# Patient Record
Sex: Male | Born: 1935 | Race: White | Hispanic: No | Marital: Married | Smoking: Former smoker
Health system: Southern US, Community
[De-identification: ages and names within clinical notes are randomized; demographics above are authoritative.]

## PROBLEM LIST (undated history)

## (undated) DIAGNOSIS — I2510 Atherosclerotic heart disease of native coronary artery without angina pectoris: Secondary | ICD-10-CM

## (undated) DIAGNOSIS — I959 Hypotension, unspecified: Secondary | ICD-10-CM

## (undated) DIAGNOSIS — D709 Neutropenia, unspecified: Secondary | ICD-10-CM

## (undated) DIAGNOSIS — I219 Acute myocardial infarction, unspecified: Secondary | ICD-10-CM

## (undated) DIAGNOSIS — E039 Hypothyroidism, unspecified: Secondary | ICD-10-CM

## (undated) DIAGNOSIS — Z7901 Long term (current) use of anticoagulants: Secondary | ICD-10-CM

## (undated) DIAGNOSIS — K469 Unspecified abdominal hernia without obstruction or gangrene: Secondary | ICD-10-CM

## (undated) DIAGNOSIS — E785 Hyperlipidemia, unspecified: Secondary | ICD-10-CM

## (undated) DIAGNOSIS — J189 Pneumonia, unspecified organism: Secondary | ICD-10-CM

## (undated) DIAGNOSIS — E538 Deficiency of other specified B group vitamins: Secondary | ICD-10-CM

## (undated) DIAGNOSIS — K219 Gastro-esophageal reflux disease without esophagitis: Secondary | ICD-10-CM

## (undated) DIAGNOSIS — I4891 Unspecified atrial fibrillation: Secondary | ICD-10-CM

## (undated) DIAGNOSIS — N400 Enlarged prostate without lower urinary tract symptoms: Secondary | ICD-10-CM

## (undated) DIAGNOSIS — D510 Vitamin B12 deficiency anemia due to intrinsic factor deficiency: Secondary | ICD-10-CM

## (undated) DIAGNOSIS — C44209 Unspecified malignant neoplasm of skin of left ear and external auricular canal: Secondary | ICD-10-CM

## (undated) DIAGNOSIS — D239 Other benign neoplasm of skin, unspecified: Secondary | ICD-10-CM

## (undated) DIAGNOSIS — M069 Rheumatoid arthritis, unspecified: Secondary | ICD-10-CM

## (undated) DIAGNOSIS — I209 Angina pectoris, unspecified: Secondary | ICD-10-CM

## (undated) HISTORY — DX: Deficiency of other specified B group vitamins: E53.8

## (undated) HISTORY — DX: Atherosclerotic heart disease of native coronary artery without angina pectoris: I25.10

## (undated) HISTORY — DX: Benign prostatic hyperplasia without lower urinary tract symptoms: N40.0

## (undated) HISTORY — DX: Unspecified atrial fibrillation: I48.91

## (undated) HISTORY — PX: CORONARY ANGIOPLASTY: SHX604

## (undated) HISTORY — DX: Long term (current) use of anticoagulants: Z79.01

## (undated) HISTORY — DX: Neutropenia, unspecified: D70.9

## (undated) HISTORY — PX: EXTERNAL EAR SURGERY: SHX627

## (undated) HISTORY — DX: Vitamin B12 deficiency anemia due to intrinsic factor deficiency: D51.0

## (undated) HISTORY — PX: CORNEAL TRANSPLANT: SHX108

## (undated) HISTORY — DX: Unspecified abdominal hernia without obstruction or gangrene: K46.9

## (undated) HISTORY — DX: Hyperlipidemia, unspecified: E78.5

## (undated) HISTORY — PX: CATARACT EXTRACTION W/ INTRAOCULAR LENS  IMPLANT, BILATERAL: SHX1307

## (undated) HISTORY — DX: Other benign neoplasm of skin, unspecified: D23.9

## (undated) HISTORY — DX: Rheumatoid arthritis, unspecified: M06.9

---

## 1979-04-23 HISTORY — PX: NASAL POLYP SURGERY: SHX186

## 2000-11-20 ENCOUNTER — Encounter: Payer: Self-pay | Admitting: Internal Medicine

## 2001-06-17 ENCOUNTER — Encounter: Payer: Self-pay | Admitting: Emergency Medicine

## 2001-06-17 ENCOUNTER — Emergency Department (HOSPITAL_COMMUNITY): Admission: EM | Admit: 2001-06-17 | Discharge: 2001-06-17 | Payer: Self-pay | Admitting: Emergency Medicine

## 2001-08-08 ENCOUNTER — Encounter: Payer: Self-pay | Admitting: Internal Medicine

## 2001-11-08 ENCOUNTER — Encounter: Payer: Self-pay | Admitting: Cardiology

## 2001-11-08 ENCOUNTER — Encounter (INDEPENDENT_AMBULATORY_CARE_PROVIDER_SITE_OTHER): Payer: Self-pay | Admitting: Specialist

## 2001-11-08 ENCOUNTER — Encounter: Payer: Self-pay | Admitting: Emergency Medicine

## 2001-11-08 ENCOUNTER — Inpatient Hospital Stay (HOSPITAL_COMMUNITY): Admission: EM | Admit: 2001-11-08 | Discharge: 2001-11-10 | Payer: Self-pay | Admitting: Emergency Medicine

## 2001-11-09 HISTORY — PX: LAPAROSCOPIC CHOLECYSTECTOMY: SUR755

## 2002-01-22 ENCOUNTER — Encounter: Payer: Self-pay | Admitting: Internal Medicine

## 2002-07-22 ENCOUNTER — Encounter: Payer: Self-pay | Admitting: Internal Medicine

## 2002-09-30 ENCOUNTER — Encounter: Payer: Self-pay | Admitting: Internal Medicine

## 2004-09-22 ENCOUNTER — Ambulatory Visit: Payer: Self-pay | Admitting: Internal Medicine

## 2005-03-28 ENCOUNTER — Ambulatory Visit: Payer: Self-pay | Admitting: Internal Medicine

## 2005-09-27 ENCOUNTER — Ambulatory Visit: Payer: Self-pay | Admitting: Internal Medicine

## 2006-04-11 ENCOUNTER — Ambulatory Visit: Payer: Self-pay | Admitting: Internal Medicine

## 2006-09-25 ENCOUNTER — Ambulatory Visit: Payer: Self-pay | Admitting: Internal Medicine

## 2006-09-25 LAB — CONVERTED CEMR LAB
AST: 37 units/L (ref 0–37)
Bilirubin, Direct: 0.1 mg/dL (ref 0.0–0.3)
Chloride: 109 meq/L (ref 96–112)
Eosinophils Absolute: 0.2 10*3/uL (ref 0.0–0.6)
Eosinophils Relative: 6.4 % — ABNORMAL HIGH (ref 0.0–5.0)
GFR calc non Af Amer: 68 mL/min
Glucose, Bld: 119 mg/dL — ABNORMAL HIGH (ref 70–99)
HCT: 42 % (ref 39.0–52.0)
Lymphocytes Relative: 43.8 % (ref 12.0–46.0)
MCV: 81.5 fL (ref 78.0–100.0)
Neutrophils Relative %: 27.1 % — ABNORMAL LOW (ref 43.0–77.0)
PSA: 1.63 ng/mL (ref 0.10–4.00)
RBC: 5.15 M/uL (ref 4.22–5.81)
Sodium: 144 meq/L (ref 135–145)
Total Bilirubin: 0.6 mg/dL (ref 0.3–1.2)
Total CHOL/HDL Ratio: 5
Total Protein: 8.6 g/dL — ABNORMAL HIGH (ref 6.0–8.3)
WBC: 3.1 10*3/uL — ABNORMAL LOW (ref 4.5–10.5)

## 2007-03-05 ENCOUNTER — Encounter: Payer: Self-pay | Admitting: Internal Medicine

## 2007-03-05 DIAGNOSIS — I2510 Atherosclerotic heart disease of native coronary artery without angina pectoris: Secondary | ICD-10-CM

## 2007-03-05 DIAGNOSIS — N400 Enlarged prostate without lower urinary tract symptoms: Secondary | ICD-10-CM

## 2007-03-05 DIAGNOSIS — I10 Essential (primary) hypertension: Secondary | ICD-10-CM

## 2007-03-05 DIAGNOSIS — M069 Rheumatoid arthritis, unspecified: Secondary | ICD-10-CM | POA: Insufficient documentation

## 2007-03-05 DIAGNOSIS — I252 Old myocardial infarction: Secondary | ICD-10-CM

## 2007-03-05 DIAGNOSIS — E785 Hyperlipidemia, unspecified: Secondary | ICD-10-CM

## 2007-03-05 HISTORY — DX: Rheumatoid arthritis, unspecified: M06.9

## 2007-03-05 HISTORY — DX: Hyperlipidemia, unspecified: E78.5

## 2007-03-05 HISTORY — DX: Atherosclerotic heart disease of native coronary artery without angina pectoris: I25.10

## 2007-03-05 HISTORY — DX: Benign prostatic hyperplasia without lower urinary tract symptoms: N40.0

## 2007-03-23 ENCOUNTER — Encounter: Payer: Self-pay | Admitting: Internal Medicine

## 2007-03-26 ENCOUNTER — Ambulatory Visit: Payer: Self-pay | Admitting: Internal Medicine

## 2007-05-11 ENCOUNTER — Ambulatory Visit: Payer: Self-pay | Admitting: Internal Medicine

## 2007-05-11 ENCOUNTER — Telehealth (INDEPENDENT_AMBULATORY_CARE_PROVIDER_SITE_OTHER): Payer: Self-pay

## 2007-05-11 DIAGNOSIS — D709 Neutropenia, unspecified: Secondary | ICD-10-CM

## 2007-05-11 HISTORY — DX: Neutropenia, unspecified: D70.9

## 2007-05-11 LAB — CONVERTED CEMR LAB
Albumin ELP: 45.8 % — ABNORMAL LOW (ref 55.8–66.1)
Alpha-1-Globulin: 4 % (ref 2.9–4.9)
Basophils Relative: 0.5 % (ref 0.0–1.0)
Folate: >20 ng/mL
Hemoglobin: 13 g/dL (ref 13.0–17.0)
Monocytes Relative: 20.4 % — ABNORMAL HIGH (ref 3.0–11.0)
Platelets: 94 10*3/uL — ABNORMAL LOW (ref 150–400)
RBC: 4.72 M/uL (ref 4.22–5.81)
RDW: 14.7 % — ABNORMAL HIGH (ref 11.5–14.6)
Total Protein, Serum Electrophoresis: 8.5 g/dL — ABNORMAL HIGH (ref 6.0–8.3)
Vitamin B-12: 130 pg/mL — ABNORMAL LOW (ref 211–911)

## 2007-05-14 ENCOUNTER — Ambulatory Visit: Payer: Self-pay | Admitting: Internal Medicine

## 2007-05-14 DIAGNOSIS — D510 Vitamin B12 deficiency anemia due to intrinsic factor deficiency: Secondary | ICD-10-CM

## 2007-05-14 HISTORY — DX: Vitamin B12 deficiency anemia due to intrinsic factor deficiency: D51.0

## 2007-05-20 ENCOUNTER — Ambulatory Visit: Payer: Self-pay | Admitting: Cardiology

## 2007-05-20 ENCOUNTER — Observation Stay (HOSPITAL_COMMUNITY): Admission: EM | Admit: 2007-05-20 | Discharge: 2007-05-24 | Payer: Self-pay | Admitting: Emergency Medicine

## 2007-05-21 ENCOUNTER — Encounter (INDEPENDENT_AMBULATORY_CARE_PROVIDER_SITE_OTHER): Payer: Self-pay | Admitting: Cardiology

## 2007-05-28 ENCOUNTER — Ambulatory Visit: Payer: Self-pay | Admitting: Internal Medicine

## 2007-06-04 ENCOUNTER — Ambulatory Visit: Payer: Self-pay | Admitting: Internal Medicine

## 2007-06-08 ENCOUNTER — Telehealth (INDEPENDENT_AMBULATORY_CARE_PROVIDER_SITE_OTHER): Payer: Self-pay

## 2007-07-17 ENCOUNTER — Encounter: Payer: Self-pay | Admitting: Internal Medicine

## 2007-08-08 ENCOUNTER — Ambulatory Visit: Payer: Self-pay | Admitting: Internal Medicine

## 2007-08-08 DIAGNOSIS — E538 Deficiency of other specified B group vitamins: Secondary | ICD-10-CM

## 2007-08-08 DIAGNOSIS — I4891 Unspecified atrial fibrillation: Secondary | ICD-10-CM

## 2007-08-08 HISTORY — DX: Unspecified atrial fibrillation: I48.91

## 2007-08-08 HISTORY — DX: Deficiency of other specified B group vitamins: E53.8

## 2007-09-27 ENCOUNTER — Ambulatory Visit: Payer: Self-pay | Admitting: Internal Medicine

## 2007-09-27 LAB — CONVERTED CEMR LAB
ALT: 24 units/L (ref 0–53)
Alkaline Phosphatase: 76 units/L (ref 39–117)
BUN: 14 mg/dL (ref 6–23)
Basophils Relative: 0.5 % (ref 0.0–1.0)
Calcium: 9.1 mg/dL (ref 8.4–10.5)
Chloride: 105 meq/L (ref 96–112)
Eosinophils Absolute: 0.1 10*3/uL (ref 0.0–0.6)
Eosinophils Relative: 6.4 % — ABNORMAL HIGH (ref 0.0–5.0)
GFR calc Af Amer: 104 mL/min
GFR calc non Af Amer: 86 mL/min
Glucose, Bld: 115 mg/dL — ABNORMAL HIGH (ref 70–99)
HDL: 19.1 mg/dL — ABNORMAL LOW (ref >39.0)
MCV: 80.6 fL (ref 78.0–100.0)
Monocytes Relative: 18 % — ABNORMAL HIGH (ref 3.0–11.0)
PSA: 1.03 ng/mL (ref 0.10–4.00)
Platelets: 92 10*3/uL — ABNORMAL LOW (ref 150–400)
RBC: 5.21 M/uL (ref 4.22–5.81)
TSH: 4.06 microintl units/mL (ref 0.35–5.50)
Total Bilirubin: 1.1 mg/dL (ref 0.3–1.2)
Total CHOL/HDL Ratio: 5.5
Total Protein: 8.1 g/dL (ref 6.0–8.3)
Triglycerides: 84 mg/dL (ref 0–149)
VLDL: 17 mg/dL (ref 0–40)
WBC: 2.2 10*3/uL — ABNORMAL LOW (ref 4.5–10.5)

## 2007-10-25 ENCOUNTER — Ambulatory Visit: Payer: Self-pay | Admitting: Internal Medicine

## 2007-11-23 ENCOUNTER — Ambulatory Visit: Payer: Self-pay | Admitting: Internal Medicine

## 2007-12-10 ENCOUNTER — Inpatient Hospital Stay (HOSPITAL_COMMUNITY): Admission: EM | Admit: 2007-12-10 | Discharge: 2007-12-14 | Payer: Self-pay | Admitting: Emergency Medicine

## 2007-12-10 ENCOUNTER — Ambulatory Visit: Payer: Self-pay | Admitting: Internal Medicine

## 2007-12-10 DIAGNOSIS — N390 Urinary tract infection, site not specified: Secondary | ICD-10-CM

## 2007-12-10 LAB — CONVERTED CEMR LAB
Nitrite: NEGATIVE
Specific Gravity, Urine: 1.025

## 2007-12-14 ENCOUNTER — Encounter: Payer: Self-pay | Admitting: Internal Medicine

## 2007-12-21 ENCOUNTER — Ambulatory Visit: Payer: Self-pay | Admitting: Internal Medicine

## 2008-01-24 ENCOUNTER — Ambulatory Visit: Payer: Self-pay | Admitting: Internal Medicine

## 2008-01-24 DIAGNOSIS — D239 Other benign neoplasm of skin, unspecified: Secondary | ICD-10-CM | POA: Insufficient documentation

## 2008-01-24 HISTORY — DX: Other benign neoplasm of skin, unspecified: D23.9

## 2008-02-21 ENCOUNTER — Ambulatory Visit: Payer: Self-pay | Admitting: Internal Medicine

## 2008-03-24 ENCOUNTER — Ambulatory Visit: Payer: Self-pay | Admitting: Internal Medicine

## 2008-04-21 ENCOUNTER — Ambulatory Visit: Payer: Self-pay | Admitting: Internal Medicine

## 2008-04-24 ENCOUNTER — Encounter: Payer: Self-pay | Admitting: Internal Medicine

## 2008-04-29 ENCOUNTER — Ambulatory Visit: Payer: Self-pay | Admitting: Internal Medicine

## 2008-05-26 ENCOUNTER — Ambulatory Visit: Payer: Self-pay | Admitting: Internal Medicine

## 2008-06-23 ENCOUNTER — Ambulatory Visit: Payer: Self-pay | Admitting: Internal Medicine

## 2008-07-29 ENCOUNTER — Ambulatory Visit: Payer: Self-pay | Admitting: Internal Medicine

## 2008-08-25 ENCOUNTER — Ambulatory Visit: Payer: Self-pay | Admitting: Internal Medicine

## 2008-08-25 DIAGNOSIS — J069 Acute upper respiratory infection, unspecified: Secondary | ICD-10-CM | POA: Insufficient documentation

## 2008-09-24 ENCOUNTER — Ambulatory Visit: Payer: Self-pay | Admitting: Internal Medicine

## 2008-10-20 ENCOUNTER — Ambulatory Visit: Payer: Self-pay | Admitting: Internal Medicine

## 2008-11-20 ENCOUNTER — Ambulatory Visit: Payer: Self-pay | Admitting: Internal Medicine

## 2008-12-03 ENCOUNTER — Ambulatory Visit: Payer: Self-pay | Admitting: Internal Medicine

## 2008-12-22 ENCOUNTER — Ambulatory Visit: Payer: Self-pay | Admitting: Internal Medicine

## 2009-01-22 ENCOUNTER — Ambulatory Visit: Payer: Self-pay | Admitting: Internal Medicine

## 2009-02-12 ENCOUNTER — Encounter: Payer: Self-pay | Admitting: Internal Medicine

## 2009-02-19 ENCOUNTER — Ambulatory Visit: Payer: Self-pay | Admitting: Internal Medicine

## 2009-03-23 ENCOUNTER — Ambulatory Visit: Payer: Self-pay | Admitting: Internal Medicine

## 2009-04-09 ENCOUNTER — Ambulatory Visit: Payer: Self-pay | Admitting: Internal Medicine

## 2009-04-09 LAB — CONVERTED CEMR LAB
ALT: 17 units/L (ref 0–53)
AST: 29 units/L (ref 0–37)
BUN: 16 mg/dL (ref 6–23)
Bilirubin, Direct: 0.1 mg/dL (ref 0.0–0.3)
Creatinine, Ser: 1.1 mg/dL (ref 0.4–1.5)
Eosinophils Relative: 7.8 % — ABNORMAL HIGH (ref 0.0–5.0)
GFR calc non Af Amer: 67.61 mL/min (ref >60)
Monocytes Absolute: 0.4 10*3/uL (ref 0.1–1.0)
Monocytes Relative: 18.1 % — ABNORMAL HIGH (ref 3.0–12.0)
Neutrophils Relative %: 42.6 % — ABNORMAL LOW (ref 43.0–77.0)
Platelets: 83 10*3/uL — ABNORMAL LOW (ref 150.0–400.0)
Total Bilirubin: 0.8 mg/dL (ref 0.3–1.2)
WBC: 2.3 10*3/uL — ABNORMAL LOW (ref 4.5–10.5)

## 2009-04-16 ENCOUNTER — Telehealth: Payer: Self-pay | Admitting: Internal Medicine

## 2009-04-22 ENCOUNTER — Ambulatory Visit: Payer: Self-pay | Admitting: Internal Medicine

## 2009-04-22 ENCOUNTER — Telehealth: Payer: Self-pay | Admitting: Internal Medicine

## 2009-04-23 ENCOUNTER — Ambulatory Visit: Payer: Self-pay | Admitting: Oncology

## 2009-04-29 ENCOUNTER — Encounter: Payer: Self-pay | Admitting: Internal Medicine

## 2009-04-29 LAB — CBC WITH DIFFERENTIAL/PLATELET
BASO%: 0.6 % (ref 0.0–2.0)
Eosinophils Absolute: 0.2 10*3/uL (ref 0.0–0.5)
LYMPH%: 36 % (ref 14.0–49.0)
MCHC: 33.3 g/dL (ref 32.0–36.0)
MCV: 78.8 fL — ABNORMAL LOW (ref 79.3–98.0)
MONO#: 0.4 10*3/uL (ref 0.1–0.9)
MONO%: 14.7 % — ABNORMAL HIGH (ref 0.0–14.0)
NEUT#: 1 10*3/uL — ABNORMAL LOW (ref 1.5–6.5)
Platelets: 96 10*3/uL — ABNORMAL LOW (ref 140–400)
RBC: 4.94 10*6/uL (ref 4.20–5.82)
RDW: 17.7 % — ABNORMAL HIGH (ref 11.0–14.6)
WBC: 2.4 10*3/uL — ABNORMAL LOW (ref 4.0–10.3)

## 2009-04-29 LAB — COMPREHENSIVE METABOLIC PANEL
ALT: 14 U/L (ref 0–53)
AST: 21 U/L (ref 0–37)
CO2: 24 mEq/L (ref 19–32)
Creatinine, Ser: 1.03 mg/dL (ref 0.40–1.50)
Sodium: 139 mEq/L (ref 135–145)
Total Bilirubin: 0.4 mg/dL (ref 0.3–1.2)
Total Protein: 7.3 g/dL (ref 6.0–8.3)

## 2009-04-29 LAB — CHCC SMEAR

## 2009-05-05 ENCOUNTER — Ambulatory Visit (HOSPITAL_COMMUNITY): Admission: RE | Admit: 2009-05-05 | Discharge: 2009-05-05 | Payer: Self-pay | Admitting: Oncology

## 2009-05-25 ENCOUNTER — Ambulatory Visit: Payer: Self-pay | Admitting: Internal Medicine

## 2009-06-22 ENCOUNTER — Ambulatory Visit: Payer: Self-pay | Admitting: Internal Medicine

## 2009-06-26 ENCOUNTER — Ambulatory Visit: Payer: Self-pay | Admitting: Oncology

## 2009-06-30 ENCOUNTER — Encounter (INDEPENDENT_AMBULATORY_CARE_PROVIDER_SITE_OTHER): Payer: Self-pay | Admitting: *Deleted

## 2009-06-30 LAB — COMPREHENSIVE METABOLIC PANEL
Albumin: 3.6 g/dL (ref 3.5–5.2)
BUN: 16 mg/dL (ref 6–23)
CO2: 24 mEq/L (ref 19–32)
Calcium: 8.6 mg/dL (ref 8.4–10.5)
Chloride: 102 mEq/L (ref 96–112)
Glucose, Bld: 103 mg/dL — ABNORMAL HIGH (ref 70–99)
Potassium: 4.5 mEq/L (ref 3.5–5.3)
Sodium: 136 mEq/L (ref 135–145)
Total Protein: 7.2 g/dL (ref 6.0–8.3)

## 2009-06-30 LAB — CBC WITH DIFFERENTIAL/PLATELET
Basophils Absolute: 0 10*3/uL (ref 0.0–0.1)
Eosinophils Absolute: 0.2 10*3/uL (ref 0.0–0.5)
HCT: 38.1 % — ABNORMAL LOW (ref 38.4–49.9)
HGB: 12.7 g/dL — ABNORMAL LOW (ref 13.0–17.1)
MCH: 26.7 pg — ABNORMAL LOW (ref 27.2–33.4)
MONO#: 0.5 10*3/uL (ref 0.1–0.9)
NEUT#: 1 10*3/uL — ABNORMAL LOW (ref 1.5–6.5)
NEUT%: 37.6 % — ABNORMAL LOW (ref 39.0–75.0)
RDW: 17.3 % — ABNORMAL HIGH (ref 11.0–14.6)
WBC: 2.5 10*3/uL — ABNORMAL LOW (ref 4.0–10.3)
lymph#: 0.9 10*3/uL (ref 0.9–3.3)

## 2009-07-06 ENCOUNTER — Encounter: Payer: Self-pay | Admitting: Oncology

## 2009-07-06 ENCOUNTER — Ambulatory Visit (HOSPITAL_COMMUNITY): Admission: RE | Admit: 2009-07-06 | Discharge: 2009-07-06 | Payer: Self-pay | Admitting: Oncology

## 2009-07-06 ENCOUNTER — Ambulatory Visit: Payer: Self-pay | Admitting: Oncology

## 2009-07-10 ENCOUNTER — Encounter (INDEPENDENT_AMBULATORY_CARE_PROVIDER_SITE_OTHER): Payer: Self-pay | Admitting: *Deleted

## 2009-07-22 ENCOUNTER — Ambulatory Visit: Payer: Self-pay | Admitting: Internal Medicine

## 2009-08-05 ENCOUNTER — Encounter (INDEPENDENT_AMBULATORY_CARE_PROVIDER_SITE_OTHER): Payer: Self-pay | Admitting: *Deleted

## 2009-08-24 ENCOUNTER — Ambulatory Visit: Payer: Self-pay | Admitting: Internal Medicine

## 2009-09-10 ENCOUNTER — Ambulatory Visit (HOSPITAL_COMMUNITY): Admission: RE | Admit: 2009-09-10 | Discharge: 2009-09-11 | Payer: Self-pay | Admitting: Otolaryngology

## 2009-09-10 ENCOUNTER — Encounter (INDEPENDENT_AMBULATORY_CARE_PROVIDER_SITE_OTHER): Payer: Self-pay | Admitting: Otolaryngology

## 2009-09-23 ENCOUNTER — Ambulatory Visit: Payer: Self-pay | Admitting: Internal Medicine

## 2009-10-12 ENCOUNTER — Ambulatory Visit: Payer: Self-pay | Admitting: Oncology

## 2009-10-14 ENCOUNTER — Encounter: Payer: Self-pay | Admitting: Internal Medicine

## 2009-10-14 LAB — COMPREHENSIVE METABOLIC PANEL
ALT: 20 U/L (ref 0–53)
AST: 27 U/L (ref 0–37)
Albumin: 3.7 g/dL (ref 3.5–5.2)
Alkaline Phosphatase: 84 U/L (ref 39–117)
BUN: 14 mg/dL (ref 6–23)
Calcium: 8.9 mg/dL (ref 8.4–10.5)
Chloride: 104 mEq/L (ref 96–112)
Potassium: 3.9 mEq/L (ref 3.5–5.3)
Sodium: 138 mEq/L (ref 135–145)
Total Protein: 7.7 g/dL (ref 6.0–8.3)

## 2009-10-14 LAB — CBC WITH DIFFERENTIAL/PLATELET
Basophils Absolute: 0 10*3/uL (ref 0.0–0.1)
EOS%: 4.2 % (ref 0.0–7.0)
HGB: 15 g/dL (ref 13.0–17.1)
MCH: 30.6 pg (ref 27.2–33.4)
MONO%: 17 % — ABNORMAL HIGH (ref 0.0–14.0)
NEUT#: 1.1 10*3/uL — ABNORMAL LOW (ref 1.5–6.5)
RBC: 4.9 10*6/uL (ref 4.20–5.82)
RDW: 17 % — ABNORMAL HIGH (ref 11.0–14.6)
lymph#: 1.2 10*3/uL (ref 0.9–3.3)

## 2009-10-15 ENCOUNTER — Ambulatory Visit: Payer: Self-pay | Admitting: Internal Medicine

## 2009-11-20 ENCOUNTER — Ambulatory Visit: Payer: Self-pay | Admitting: Internal Medicine

## 2009-12-21 ENCOUNTER — Ambulatory Visit: Payer: Self-pay | Admitting: Internal Medicine

## 2010-01-21 ENCOUNTER — Ambulatory Visit: Payer: Self-pay | Admitting: Internal Medicine

## 2010-02-08 ENCOUNTER — Ambulatory Visit: Payer: Self-pay | Admitting: Oncology

## 2010-02-10 ENCOUNTER — Encounter: Payer: Self-pay | Admitting: Internal Medicine

## 2010-02-10 LAB — CBC WITH DIFFERENTIAL/PLATELET
Basophils Absolute: 0 10*3/uL (ref 0.0–0.1)
Eosinophils Absolute: 0.1 10*3/uL (ref 0.0–0.5)
HGB: 16.3 g/dL (ref 13.0–17.1)
MONO#: 0.4 10*3/uL (ref 0.1–0.9)
NEUT#: 0.9 10*3/uL — ABNORMAL LOW (ref 1.5–6.5)
RBC: 4.97 10*6/uL (ref 4.20–5.82)
RDW: 14.8 % — ABNORMAL HIGH (ref 11.0–14.6)
WBC: 2.2 10*3/uL — ABNORMAL LOW (ref 4.0–10.3)
lymph#: 0.8 10*3/uL — ABNORMAL LOW (ref 0.9–3.3)

## 2010-02-10 LAB — COMPREHENSIVE METABOLIC PANEL
Albumin: 3.7 g/dL (ref 3.5–5.2)
BUN: 12 mg/dL (ref 6–23)
Calcium: 8.9 mg/dL (ref 8.4–10.5)
Chloride: 107 mEq/L (ref 96–112)
Glucose, Bld: 92 mg/dL (ref 70–99)
Potassium: 4.1 mEq/L (ref 3.5–5.3)
Sodium: 137 mEq/L (ref 135–145)
Total Protein: 7.4 g/dL (ref 6.0–8.3)

## 2010-02-18 ENCOUNTER — Ambulatory Visit: Payer: Self-pay | Admitting: Internal Medicine

## 2010-02-26 ENCOUNTER — Encounter: Payer: Self-pay | Admitting: Internal Medicine

## 2010-03-22 ENCOUNTER — Ambulatory Visit: Payer: Self-pay | Admitting: Internal Medicine

## 2010-04-13 ENCOUNTER — Ambulatory Visit: Payer: Self-pay | Admitting: Internal Medicine

## 2010-04-13 LAB — CONVERTED CEMR LAB
Albumin: 3.7 g/dL (ref 3.5–5.2)
BUN: 17 mg/dL (ref 6–23)
Basophils Relative: 0.4 % (ref 0.0–3.0)
Calcium: 9 mg/dL (ref 8.4–10.5)
Cholesterol: 125 mg/dL (ref 0–200)
Creatinine, Ser: 1 mg/dL (ref 0.4–1.5)
Eosinophils Relative: 6.1 % — ABNORMAL HIGH (ref 0.0–5.0)
GFR calc non Af Amer: 75.28 mL/min (ref >60)
Glucose, Bld: 117 mg/dL — ABNORMAL HIGH (ref 70–99)
HCT: 52.3 % — ABNORMAL HIGH (ref 39.0–52.0)
Lymphs Abs: 1 10*3/uL (ref 0.7–4.0)
MCV: 96.2 fL (ref 78.0–100.0)
Monocytes Absolute: 0.4 10*3/uL (ref 0.1–1.0)
Neutro Abs: 1.2 10*3/uL — ABNORMAL LOW (ref 1.4–7.7)
Platelets: 89 10*3/uL — ABNORMAL LOW (ref 150.0–400.0)
Sodium: 143 meq/L (ref 135–145)
TSH: 5.37 microintl units/mL (ref 0.35–5.50)
WBC: 2.9 10*3/uL — ABNORMAL LOW (ref 4.5–10.5)

## 2010-04-22 ENCOUNTER — Ambulatory Visit: Payer: Self-pay | Admitting: Internal Medicine

## 2010-05-21 ENCOUNTER — Ambulatory Visit: Payer: Self-pay | Admitting: Internal Medicine

## 2010-06-14 ENCOUNTER — Ambulatory Visit: Payer: Self-pay | Admitting: Oncology

## 2010-06-16 ENCOUNTER — Encounter: Payer: Self-pay | Admitting: Internal Medicine

## 2010-06-16 LAB — CBC WITH DIFFERENTIAL/PLATELET
Basophils Absolute: 0 10*3/uL (ref 0.0–0.1)
Eosinophils Absolute: 0.1 10*3/uL (ref 0.0–0.5)
HGB: 16.3 g/dL (ref 13.0–17.1)
NEUT#: 1.8 10*3/uL (ref 1.5–6.5)
RBC: 4.94 10*6/uL (ref 4.20–5.82)
RDW: 14.7 % — ABNORMAL HIGH (ref 11.0–14.6)
WBC: 3.5 10*3/uL — ABNORMAL LOW (ref 4.0–10.3)
lymph#: 1.2 10*3/uL (ref 0.9–3.3)

## 2010-06-16 LAB — COMPREHENSIVE METABOLIC PANEL
AST: 28 U/L (ref 0–37)
Albumin: 3.8 g/dL (ref 3.5–5.2)
BUN: 17 mg/dL (ref 6–23)
Calcium: 9.1 mg/dL (ref 8.4–10.5)
Chloride: 104 mEq/L (ref 96–112)
Glucose, Bld: 111 mg/dL — ABNORMAL HIGH (ref 70–99)
Potassium: 4.7 mEq/L (ref 3.5–5.3)
Sodium: 135 mEq/L (ref 135–145)
Total Protein: 7.4 g/dL (ref 6.0–8.3)

## 2010-06-22 ENCOUNTER — Ambulatory Visit: Payer: Self-pay | Admitting: Internal Medicine

## 2010-07-22 ENCOUNTER — Ambulatory Visit: Payer: Self-pay | Admitting: Internal Medicine

## 2010-08-22 HISTORY — PX: INGUINAL HERNIA REPAIR: SUR1180

## 2010-08-25 ENCOUNTER — Ambulatory Visit
Admission: RE | Admit: 2010-08-25 | Discharge: 2010-08-25 | Payer: Self-pay | Source: Home / Self Care | Attending: Internal Medicine | Admitting: Internal Medicine

## 2010-09-20 ENCOUNTER — Encounter
Admission: RE | Admit: 2010-09-20 | Discharge: 2010-09-20 | Payer: Self-pay | Source: Home / Self Care | Attending: Cardiology | Admitting: Cardiology

## 2010-09-20 ENCOUNTER — Encounter: Payer: Self-pay | Admitting: Internal Medicine

## 2010-09-21 ENCOUNTER — Ambulatory Visit
Admission: RE | Admit: 2010-09-21 | Discharge: 2010-09-21 | Payer: Self-pay | Source: Home / Self Care | Attending: Vascular Surgery | Admitting: Vascular Surgery

## 2010-09-21 NOTE — Letter (Signed)
[facility]   Imported By: Maryln Gottron 03/02/2010 15:27:48  _____________________________________________________________________  External Attachment:    Type:   Image     Comment:   External Document

## 2010-09-21 NOTE — Letter (Signed)
Summary: [facility]  [facility]   Imported By: Maryln Gottron 07/05/2010 12:49:46  _____________________________________________________________________  External Attachment:    Type:   Image     Comment:   External Document

## 2010-09-21 NOTE — Assessment & Plan Note (Signed)
Summary: B12 INJ // RS  Nurse Visit   Vitals Entered By: Duard Brady LPN (February 18, 2010 10:40 AM)  Allergies: 1)  ! Vantin (Cefpodoxime Proxetil)  Medication Administration  Injection # 1:    Medication: Vit B12 1000 mcg    Diagnosis: VITAMIN B12 DEFICIENCY (ICD-266.2)    Route: IM    Site: L deltoid    Exp Date: 09/2011    Lot #: [id number]    Mfr: [employer]    Patient tolerated injection without complications    Given by: Duard Brady LPN (February 18, 2010 10:41 AM)  Orders Added: 1)  Vit B12 1000 mcg [J3420] 2)  Admin of Therapeutic Inj  intramuscular or subcutaneous [[id number]]

## 2010-09-21 NOTE — Letter (Signed)
Summary: Cardiology-Dr. Viann Fish  Cardiology-Dr. Viann Fish   Imported By: Maryln Gottron 03/04/2010 13:24:12  _____________________________________________________________________  External Attachment:    Type:   Image     Comment:   External Document

## 2010-09-21 NOTE — Assessment & Plan Note (Signed)
Summary: b12 inj/njr  Nurse Visit   Vital Signs:  Patient profile:   77 year old male Temp:     97.5 degrees F oral  Vitals Entered By: Duard Brady LPN (April 22, 2010 10:06 AM)  Allergies: 1)  ! Vantin (Cefpodoxime Proxetil)  Medication Administration  Injection # 1:    Medication: Vit B12 1000 mcg    Diagnosis: VITAMIN B12 DEFICIENCY (ICD-266.2)    Route: IM    Site: R deltoid    Exp Date: 11/2011    Lot #: [id number]    Mfr: [employer]    Patient tolerated injection without complications    Given by: Duard Brady LPN (April 22, 2010 10:07 AM)  Orders Added: 1)  Vit B12 1000 mcg [J3420] 2)  Admin of Therapeutic Inj  intramuscular or subcutaneous [[id number]]

## 2010-09-21 NOTE — Assessment & Plan Note (Signed)
Summary: b-12 inj/cjr  Nurse Visit   Allergies: 1)  ! Vantin (Cefpodoxime Proxetil)  Medication Administration  Injection # 1:    Medication: Vit B12 1000 mcg    Diagnosis: VITAMIN B12 DEFICIENCY (ICD-266.2)    Route: IM    Site: L deltoid    Exp Date: 03/2011    Lot #: [id number]    Mfr: [employer]    Patient tolerated injection without complications    Given by: Raechel Ache, RN (September 23, 2009 10:15 AM)  Orders Added: 1)  Vit B12 1000 mcg [J3420] 2)  Admin of Therapeutic Inj  intramuscular or subcutaneous [[id number]]

## 2010-09-21 NOTE — Assessment & Plan Note (Signed)
Summary: b-12 inj/cjr  Nurse Visit   Allergies: 1)  ! Vantin (Cefpodoxime Proxetil)

## 2010-09-21 NOTE — Assessment & Plan Note (Signed)
Summary: b12 inj//ccm  Nurse Visit   Vitals Entered By: Duard Brady LPN (March 22, 2010 10:00 AM)  Allergies: 1)  ! Vantin (Cefpodoxime Proxetil)  Medication Administration  Injection # 1:    Medication: Vit B12 1000 mcg    Diagnosis: VITAMIN B12 DEFICIENCY (ICD-266.2)    Route: IM    Site: R deltoid    Exp Date: 09/2011    Lot #: [id number]    Mfr: [employer]    Patient tolerated injection without complications    Given by: Duard Brady LPN (March 22, 2010 10:01 AM)  Orders Added: 1)  Vit B12 1000 mcg [J3420] 2)  Admin of Therapeutic Inj  intramuscular or subcutaneous [[id number]]

## 2010-09-21 NOTE — Assessment & Plan Note (Signed)
Summary: B12 INJ // RS  Nurse Visit   Allergies: 1)  ! Vantin (Cefpodoxime Proxetil)  Medication Administration  Injection # 1:    Medication: Vit B12 1000 mcg    Diagnosis: VITAMIN B12 DEFICIENCY (ICD-266.2)    Route: IM    Site: L deltoid    Exp Date: 04/2012    Lot #: [id number]    Mfr: [employer]    Patient tolerated injection without complications    Given by: Duard Brady LPN (January 22, 9603 10:23 AM)  Orders Added: 1)  Vit B12 1000 mcg [J3420] 2)  Admin of Therapeutic Inj  intramuscular or subcutaneous [[id number]]

## 2010-09-21 NOTE — Assessment & Plan Note (Signed)
Summary: b12//ccm  Nurse Visit   Vital Signs:  Patient profile:   77 year old male Temp:     98.0 degrees F oral  Vitals Entered By: Duard Brady LPN (May 21, 2010 10:17 AM)  Allergies: 1)  ! Vantin  Medication Administration  Injection # 1:    Medication: Vit B12 1000 mcg    Diagnosis: VITAMIN B12 DEFICIENCY (ICD-266.2)    Route: IM    Site: L deltoid    Exp Date: 11/2011    Lot #: [id number]    Mfr: [employer]    Patient tolerated injection without complications    Given by: Duard Brady LPN (May 21, 2010 10:18 AM)  Orders Added: 1)  Vit B12 1000 mcg [J3420] 2)  Admin of Therapeutic Inj  intramuscular or subcutaneous [[id number]]

## 2010-09-21 NOTE — Assessment & Plan Note (Signed)
Summary: 6 MONTH ROV/NJR   Vital Signs:  Patient profile:   77 year old male Weight:      174 pounds Temp:     97.4 degrees F oral BP sitting:   100 / 62  (left arm) Cuff size:   regular  Vitals Entered By: Duard Brady LPN (October 15, 2009 10:37 AM) CC: 6 mos rov - doing well , had (r) ear surg .  , also requesting b12 injection if not to early  Is Patient Diabetic? No   CC:  6 mos rov - doing well , had (r) ear surg .  , and also requesting b12 injection if not to early .  History of Present Illness: 77 year old patient seen today for follow-up.  He has had recent surgery involving the right ear for a basal cell cancer.  He has B12 deficiency, chronic atrial fibrillation  and a history of RA, which has been quite absent.  He has chronic artery disease.  He is followed by cardiology and has done quite well.  Denies any exertional chest pain or shortness of breath.  He remains on chronic anticoagulation therapy.  Preventive Screening-Counseling & Management  Alcohol-Tobacco     Smoking Status: quit  Allergies: 1)  ! Vantin (Cefpodoxime Proxetil)  Past History:  Past Medical History: Reviewed history from 12/21/2007 and no changes required. Coronary artery disease MI 1992 Hyperlipidemia Hypertension Benign prostatic hypertrophy Rheumatoid arthritis Myocardial infarction, hx of 1982 Anticoagulation therapy Atrial fibrillation vitamin B12 deficiency history of urosepsis, April 2009  Family History: Reviewed history from 04/09/2009 and no changes required. father died at  55 of tuberculosis  mother deceased at 79 of an acute myocardial infarction one half sister lived into her 73s  Review of Systems       The patient complains of suspicious skin lesions.  The patient denies anorexia, fever, weight loss, weight gain, vision loss, decreased hearing, hoarseness, chest pain, syncope, dyspnea on exertion, peripheral edema, prolonged cough, headaches, hemoptysis,  abdominal pain, melena, hematochezia, severe indigestion/heartburn, incontinence, genital sores, muscle weakness, transient blindness, difficulty walking, depression, unusual weight change, abnormal bleeding, enlarged lymph nodes, angioedema, breast masses, and testicular masses.    Physical Exam  General:  Well-developed,well-nourished,in no acute distress; alert,appropriate and cooperative throughout examination; low pressure 104/64 Head:  Normocephalic and atraumatic without obvious abnormalities. No apparent alopecia or balding. Eyes:  No corneal or conjunctival inflammation noted. EOMI. Perrla. Funduscopic exam benign, without hemorrhages, exudates or papilledema. Vision grossly normal. Ears:  healing  right ear resection and skin grafting Mouth:  Oral mucosa and oropharynx without lesions or exudates.  Teeth in good repair. Neck:  No deformities, masses, or tenderness noted. Lungs:  Normal respiratory effort, chest expands symmetrically. Lungs are clear to auscultation, no crackles or wheezes. Heart:  Normal rate and irregular rhythm. S1 and S2 normal without gallop, murmur, click, rub or other extra sounds. Abdomen:  Bowel sounds positive,abdomen soft and non-tender without masses, organomegaly or hernias noted. Msk:  No deformity or scoliosis noted of thoracic or lumbar spine.   Pulses:  R and L carotid,radial,femoral,dorsalis pedis and posterior tibial pulses are full and equal bilaterally Extremities:  No clubbing, cyanosis, edema, or deformity noted with normal full range of motion of all joints.     Impression & Recommendations:  Problem # 1:  ATRIAL FIBRILLATION (ICD-427.31)  His updated medication list for this problem includes:    Metoprolol Succinate 25 Mg Tb24 (Metoprolol succinate) ..... One every am    Coumadin  5 Mg Tabs (Warfarin sodium) .Marland Kitchen... As dir  His updated medication list for this problem includes:    Metoprolol Succinate 25 Mg Tb24 (Metoprolol succinate) .....  One every am    Coumadin 5 Mg Tabs (Warfarin sodium) .Marland Kitchen... As dir  Problem # 2:  ANTICOAGULATION THERAPY (ICD-V58.61)  Problem # 3:  VITAMIN B12 DEFICIENCY (ICD-266.2)  Problem # 4:  HYPERTENSION (ICD-401.9)  His updated medication list for this problem includes:    Metoprolol Succinate 25 Mg Tb24 (Metoprolol succinate) ..... One every am  His updated medication list for this problem includes:    Metoprolol Succinate 25 Mg Tb24 (Metoprolol succinate) ..... One every am  Problem # 5:  CORONARY ARTERY DISEASE (ICD-414.00)  His updated medication list for this problem includes:    Metoprolol Succinate 25 Mg Tb24 (Metoprolol succinate) ..... One every am  His updated medication list for this problem includes:    Metoprolol Succinate 25 Mg Tb24 (Metoprolol succinate) ..... One every am  Complete Medication List: 1)  Niaspan 500 Mg Tbcr (Niacin (antihyperlipidemic)) .Marland Kitchen.. 1000 mg at bedtime 2)  Metoprolol Succinate 25 Mg Tb24 (Metoprolol succinate) .... One every am 3)  Coumadin 5 Mg Tabs (Warfarin sodium) .... As dir 4)  Cobal-1000 1000 Mcg/ml Inj Soln (Cyanocobalamin) .... Q month 5)  Crestor 20 Mg Tabs (Rosuvastatin calcium) .... 1/2 once daily 6)  Hydrocodone-homatropine 5-1.5 Mg/82ml Syrp (Hydrocodone-homatropine) .Marland Kitchen.. 1 teaspoon every 6 hours as needed for cough 7)  Pravastatin Sodium 40 Mg Tabs (Pravastatin sodium) .... Qd 8)  Mucinex 600 Mg Xr12h-tab (Guaifenesin) .... Qd 9)  Vitamin C Cr 500 Mg Cr-tabs (Ascorbic acid) .... Qd 10)  Centrum Silver Tabs (Multiple vitamins-minerals) .... Qd 11)  Fish Oil 1000 Mg Caps (Omega-3 fatty acids) .... 2 by mouth qd 12)  Ferrous Sulfate 325 (65 Fe) Mg Tabs (Ferrous sulfate) .... Qd  Patient Instructions: 1)  Please schedule a follow-up appointment in 6 months for CPX 2)  Advised not to eat any food or drink any liquids after 10 PM the night before your procedure. 3)  Limit your Sodium (Salt).  Appended Document: 6 MONTH  ROV/NJR     Allergies: 1)  ! Vantin (Cefpodoxime Proxetil)   Complete Medication List: 1)  Niaspan 500 Mg Tbcr (Niacin (antihyperlipidemic)) .Marland Kitchen.. 1000 mg at bedtime 2)  Metoprolol Succinate 25 Mg Tb24 (Metoprolol succinate) .... One every am 3)  Coumadin 5 Mg Tabs (Warfarin sodium) .... As dir 4)  Cobal-1000 1000 Mcg/ml Inj Soln (Cyanocobalamin) .... Q month 5)  Crestor 20 Mg Tabs (Rosuvastatin calcium) .... 1/2 once daily 6)  Hydrocodone-homatropine 5-1.5 Mg/57ml Syrp (Hydrocodone-homatropine) .Marland Kitchen.. 1 teaspoon every 6 hours as needed for cough 7)  Pravastatin Sodium 40 Mg Tabs (Pravastatin sodium) .... Qd 8)  Mucinex 600 Mg Xr12h-tab (Guaifenesin) .... Qd 9)  Vitamin C Cr 500 Mg Cr-tabs (Ascorbic acid) .... Qd 10)  Centrum Silver Tabs (Multiple vitamins-minerals) .... Qd 11)  Fish Oil 1000 Mg Caps (Omega-3 fatty acids) .... 2 by mouth qd 12)  Ferrous Sulfate 325 (65 Fe) Mg Tabs (Ferrous sulfate) .... Qd  Other Orders: Vit B12 1000 mcg (J3420) Admin of Therapeutic Inj  intramuscular or subcutaneous ([id number])    Medication Administration  Injection # 1:    Medication: Vit B12 1000 mcg    Diagnosis: VITAMIN B12 DEFICIENCY (ICD-266.2)    Route: IM    Site: L deltoid    Exp Date: 04-2011    Lot #: [id number]    Mfr: [employer]  Patient tolerated injection without complications  Orders Added: 1)  Vit B12 1000 mcg [J3420] 2)  Admin of Therapeutic Inj  intramuscular or subcutaneous [[id number]]

## 2010-09-21 NOTE — Letter (Signed)
[facility]   Imported By: Maryln Gottron 10/30/2009 11:05:41  _____________________________________________________________________  External Attachment:    Type:   Image     Comment:   External Document

## 2010-09-21 NOTE — Assessment & Plan Note (Signed)
Summary: B12 INJ/NJR  Nurse Visit   Allergies: 1)  ! Vantin  Appended Document: Orders Update    Clinical Lists Changes  Orders: Added new Service order of Vit B12 1000 mcg ([id number]) - Signed Added new Service order of Admin of Therapeutic Inj  intramuscular or subcutaneous ([id number]) - Signed       Medication Administration  Injection # 1:    Medication: Vit B12 1000 mcg    Diagnosis: VITAMIN B12 DEFICIENCY (ICD-266.2)    Route: IM    Site: L deltoid    Exp Date: 02/20/2012    Lot #: [id number]    Mfr: [employer]    Patient tolerated injection without complications    Given by: Kern Reap CMA (AAMA) (June 22, 2010 11:14 AM)  Orders Added: 1)  Vit B12 1000 mcg [J3420] 2)  Admin of Therapeutic Inj  intramuscular or subcutaneous [[id number]]

## 2010-09-21 NOTE — Assessment & Plan Note (Signed)
Summary: B-12 INJ/CJR  Nurse Visit   Vitals Entered By: Duard Brady LPN (Dec 22, 4399 10:15 AM)  Allergies: 1)  ! Vantin (Cefpodoxime Proxetil)  Medication Administration  Injection # 1:    Medication: Vit B12 1000 mcg    Diagnosis: ANEMIA, PERNICIOUS (ICD-281.0)    Route: IM    Site: L deltoid    Exp Date: 04/2011    Lot #: [id number]    Mfr: [employer]    Patient tolerated injection without complications    Given by: Duard Brady LPN (Dec 21, 5364 10:16 AM)  Orders Added: 1)  Vit B12 1000 mcg [J3420] 2)  Admin of Therapeutic Inj  intramuscular or subcutaneous [[id number]]

## 2010-09-21 NOTE — Assessment & Plan Note (Signed)
Summary: b12 inj//ccm  Nurse Visit   Vitals Entered By: Duard Brady LPN (July 22, 2010 10:04 AM)  Allergies: 1)  ! Vantin  Medication Administration  Injection # 1:    Medication: Vit B12 1000 mcg    Diagnosis: VITAMIN B12 DEFICIENCY (ICD-266.2)    Route: IM    Site: R deltoid    Exp Date: 02/2012    Lot #: [id number]    Mfr: [employer]    Patient tolerated injection without complications    Given by: Duard Brady LPN (July 22, 2010 10:05 AM)  Orders Added: 1)  Vit B12 1000 mcg [J3420] 2)  Admin of Therapeutic Inj  intramuscular or subcutaneous [[id number]]

## 2010-09-21 NOTE — Assessment & Plan Note (Signed)
Summary: B-12 INJ // RS  Nurse Visit   Allergies: 1)  ! Vantin (Cefpodoxime Proxetil)  Medication Administration  Injection # 1:    Medication: Vit B12 1000 mcg    Diagnosis: VITAMIN B12 DEFICIENCY (ICD-266.2)    Route: IM    Site: L deltoid    Exp Date: 03/2011    Lot #: [id number]    Mfr: [employer]    Patient tolerated injection without complications    Given by: Raechel Ache, RN (August 24, 2009 4:01 PM)  Orders Added: 1)  Vit B12 1000 mcg [J3420] 2)  Admin of Therapeutic Inj  intramuscular or subcutaneous [[id number]]

## 2010-09-21 NOTE — Assessment & Plan Note (Signed)
Summary: pt will come in fasting/njr   Vital Signs:  Patient profile:   77 year old male Height:      69 inches Weight:      169 pounds BMI:     25.05 Temp:     97.6 degrees F oral BP sitting:   102 / 60  (left arm) Cuff size:   regular  Vitals Entered By: Kathrynn Speed CMA (April 13, 2010 9:34 AM) CC: cpx, pt  fasting, src Is Patient Diabetic? No   CC:  cpx, pt  fasting, and src.  History of Present Illness: 77 year old patient seen today for a comprehensive evaluation.  he is followed closely by cardiology and has a history of coronary artery disease and permanent atrial fibrillation.  He remains on chronic anticoagulation therapy.  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  He also has a history of B12 deficiency.  He has dyslipidemia and hypertension.  Here for Medicare AWV:  1.   Risk factors based on Past M, S, F history:  patient has a history of hypertension, dyslipidemia, and coronary artery disease 2.   Physical Activities: fairly active, but limited by age and arthritis 3.   Depression/mood: no history of depression or mood disorder 4.   Hearing: mild hearing impairment; is scheduled for hearing aids in the near future 5.   ADL's: independent in all aspects of daily living 6.   Fall Risk: moderate  due to age and arthritis 7.   Home Safety: no problems identified 8.   Height, weight, &visual acuity:height and weight stable;  has had recent correction to his glasses 9.   Counseling: heart healthy diet discussed and encouraged.  Will continue monthly INRs 10.   Labs ordered based on risk factors: laboratory studies, including an SGOT will be reviewed.  Will continue monthly prothrombin times 11.           Referral Coordination- will continue follow-up with cardiology and hematology 12.           Care Plan- hearing aid evaluation as planned 13.            Cognitive Assessment- alert and oriented, with normal affect   Preventive  Screening-Counseling & Management  Alcohol-Tobacco     Smoking Status: never  Caffeine-Diet-Exercise     Does Patient Exercise: no  Current Medications (verified): 1)  Niaspan 500 Mg  Tbcr (Niacin (Antihyperlipidemic)) .Marland Kitchen.. 1000 Mg At Bedtime 2)  Metoprolol Succinate 25 Mg  Tb24 (Metoprolol Succinate) .... One Every Am 3)  Coumadin 5 Mg  Tabs (Warfarin Sodium) .... As Dir 4)  Pravastatin Sodium 40 Mg Tabs (Pravastatin Sodium) .... Qd 5)  Mucinex 600 Mg Xr12h-Tab (Guaifenesin) .... Qd 6)  Vitamin C Cr 500 Mg Cr-Tabs (Ascorbic Acid) .... Qd 7)  Centrum Silver  Tabs (Multiple Vitamins-Minerals) .... Qd 8)  Fish Oil 1000 Mg Caps (Omega-3 Fatty Acids) .... 2 By Mouth Qd  Allergies (verified): 1)  ! Vantin (Cefpodoxime Proxetil)  Past History:  Past Medical History: Coronary artery disease MI 1992 Hyperlipidemia Hypertension Benign prostatic hypertrophy Rheumatoid arthritis Myocardial infarction, hx of 1982 Anticoagulation therapy Atrial fibrillation vitamin B12 deficiency history of urosepsis, April 2009 neutropenia   Past Surgical History: Reviewed history from 09/27/2007 and no changes required. Cholecystectomy  laparoscopic 2003 Percutaneous transluminal coronary angioplasty  1997 nasal polyps  Family History: Reviewed history from 04/09/2009 and no changes required. father died at  55 of tuberculosis  mother deceased at 75 of  an acute myocardial infarction one half sister lived into her 30s  Social History: Married Never Smoked Regular exercise-no Smoking Status:  never Does Patient Exercise:  no  Review of Systems  The patient denies anorexia, fever, weight loss, weight gain, vision loss, decreased hearing, hoarseness, chest pain, syncope, dyspnea on exertion, peripheral edema, prolonged cough, headaches, hemoptysis, abdominal pain, melena, hematochezia, severe indigestion/heartburn, hematuria, incontinence, genital sores, muscle weakness, suspicious skin  lesions, transient blindness, difficulty walking, depression, unusual weight change, abnormal bleeding, enlarged lymph nodes, angioedema, breast masses, and testicular masses.    Physical Exam  General:  elderly alert, no distress.  Blood pressure 110/70 Head:  Normocephalic and atraumatic without obvious abnormalities. No apparent alopecia or balding. Eyes:  No corneal or conjunctival inflammation noted. EOMI. Perrla. Funduscopic exam benign, without hemorrhages, exudates or papilledema. Vision grossly normal. Ears:  External ear exam shows no significant lesions or deformities.  Otoscopic examination reveals clear canals, tympanic membranes are intact bilaterally without bulging, retraction, inflammation or discharge. Hearing is grossly normal bilaterally. Nose:  External nasal examination shows no deformity or inflammation. Nasal mucosa are pink and moist without lesions or exudates. Mouth:  Oral mucosa and oropharynx without lesions or exudates.  Teeth in good repair. Neck:  No deformities, masses, or tenderness noted. Chest Wall:  No deformities, masses, tenderness or gynecomastia noted. Lungs:  bibasilar crackles.  The right greater than the left Heart:  irregular rhythm with controlled ventricular response Abdomen:  Bowel sounds positive,abdomen soft and non-tender without masses, organomegaly or hernias noted. Rectal:  No external abnormalities noted. Normal sphincter tone. No rectal masses or tenderness. Genitalia:  Testes bilaterally descended without nodularity, tenderness or masses. No scrotal masses or lesions. No penis lesions or urethral discharge. Prostate:  2+ enlarged.  2+ enlarged.   Msk:  ulnar  deviation of the hands, right greater than left, consistent with his history of RA Pulses:  R and L carotid,radial,femoral,dorsalis pedis and posterior tibial pulses are full and equal bilaterally Extremities:  No clubbing, cyanosis, edema, or deformity noted with normal full range of  motion of all joints.   Neurologic:  alert & oriented X3, strength normal in all extremities, sensation intact to light touch, sensation intact to pinprick, DTRs symmetrical and normal, and heel-to-shin normal.  alert & oriented X3, strength normal in all extremities, sensation intact to light touch, sensation intact to pinprick, DTRs symmetrical and normal, and heel-to-shin normal.   Skin:  Intact without suspicious lesions or rashes Cervical Nodes:  No lymphadenopathy noted Axillary Nodes:  No palpable lymphadenopathy Inguinal Nodes:  No significant adenopathy Psych:  Cognition and judgment appear intact. Alert and cooperative with normal attention span and concentration. No apparent delusions, illusions, hallucinations   Impression & Recommendations:  Problem # 1:  PREVENTIVE HEALTH CARE (ICD-V70.0)  Orders: Medicare -1st Annual Wellness Visit [phone])  Complete Medication List: 1)  Niaspan 500 Mg Tbcr (Niacin (antihyperlipidemic)) .Marland Kitchen.. 1000 mg at bedtime 2)  Metoprolol Succinate 25 Mg Tb24 (Metoprolol succinate) .... One every am 3)  Coumadin 5 Mg Tabs (Warfarin sodium) .... As dir 4)  Pravastatin Sodium 40 Mg Tabs (Pravastatin sodium) .... Qd 5)  Mucinex 600 Mg Xr12h-tab (Guaifenesin) .... Qd 6)  Vitamin C Cr 500 Mg Cr-tabs (Ascorbic acid) .... Qd 7)  Centrum Silver Tabs (Multiple vitamins-minerals) .... Qd 8)  Fish Oil 1000 Mg Caps (Omega-3 fatty acids) .... 2 by mouth qd  Other Orders: Venipuncture (09811) TLB-CBC Platelet - w/Differential (85025-CBCD) TLB-BMP (Basic Metabolic Panel-BMET) (80048-METABOL) TLB-Hepatic/Liver Function  Pnl (80076-HEPATIC) TLB-TSH (Thyroid Stimulating Hormone) (84443-TSH) TLB-Cholesterol, Total (82465-CHO)  Patient Instructions: 1)  Please schedule a follow-up appointment in 6 months. 2)  Limit your Sodium (Salt). 3)  It is important that you exercise regularly at least 20 minutes 5 times a week. If you develop chest pain, have severe difficulty  breathing, or feel very tired , stop exercising immediately and seek medical attention. 4)  Take calcium +Vitamin D daily. Prescriptions: PRAVASTATIN SODIUM 40 MG TABS (PRAVASTATIN SODIUM) qd  #90 x 6   Entered and Authorized by:   Gordy Savers  MD   Signed by:   Gordy Savers  MD on 04/13/2010   Method used:   Electronically to        [facility] [phone]* (retail)       [address]       [place], [place]  [address]       Ph: [phone]       Fax: [phone]   RxID:   [id number] COUMADIN 5 MG  TABS (WARFARIN SODIUM) as dir  #90 x 6   Entered and Authorized by:   Gordy Savers  MD   Signed by:   Gordy Savers  MD on 04/13/2010   Method used:   Electronically to        [facility] [phone]* (retail)       [address]       [place], [place]  [address]       Ph: [phone]       Fax: [phone]   RxID:   [id number] METOPROLOL SUCCINATE 25 MG  TB24 (METOPROLOL SUCCINATE) one every am  #180 x 0   Entered and Authorized by:   Gordy Savers  MD   Signed by:   Gordy Savers  MD on 04/13/2010   Method used:   Electronically to        [facility] [phone]* (retail)       [address]       [place], [place]  [address]       Ph: [phone]       Fax: [phone]   RxID:   [id number] NIASPAN 500 MG  TBCR (NIACIN (ANTIHYPERLIPIDEMIC)) 1000 mg at bedtime  #180 x 0   Entered and Authorized by:   Gordy Savers  MD   Signed by:   Gordy Savers  MD on 04/13/2010   Method used:   Electronically to        [facility] [phone]* (retail)       [address]       [place], [place]  [address]       Ph: [phone]       Fax: [phone]   RxID:   [id number]

## 2010-09-22 ENCOUNTER — Encounter: Payer: Self-pay | Admitting: Cardiology

## 2010-09-23 NOTE — Assessment & Plan Note (Signed)
Summary: B-12 INJ/CJR  Nurse Visit   Allergies: 1)  ! Vantin  Medication Administration  Injection # 1:    Medication: Vit B12 1000 mcg    Diagnosis: ANEMIA, PERNICIOUS (ICD-281.0)    Route: IM    Site: L deltoid    Exp Date: 05/2012    Lot #: [id number]    Mfr: [employer]ent    Patient tolerated injection without complications    Given by: Duard Brady LPN (August 25, 2010 10:00 AM)  Orders Added: 1)  Vit B12 1000 mcg [J3420] 2)  Admin of Therapeutic Inj  intramuscular or subcutaneous [24401]

## 2010-09-27 NOTE — Procedures (Unsigned)
DUPLEX DEEP VENOUS EXAM - LOWER EXTREMITY  INDICATION:  Left lower extremity edema.  HISTORY:  Edema:  Yes, left leg. Trauma/Surgery:  No. Pain:  Yes. PE:  No. Previous DVT:  No. Anticoagulants:  Yes, long-term use of warfarin for atrial fibrillation. Other: Mass of left hamstring.  DUPLEX EXAM:               CFV   SFV   PopV  PTV    GSV               R  L  R  L  R  L  R   L  R  L Thrombosis    o  o  o     o  o  o      o  o Spontaneous   +  +  +     +  +  + Phasic        +  +  +     +  +  + Augmentation  +  +  +     +  +  + Compressible  +  +  +     +  +  + Competent     +  +  +     o  o  +  Legend:  + - yes  o - no  p - partial  D - decreased  IMPRESSION: 1. No evidence of deep venous thrombosis in the right or left lower     extremity. 2. Limited visualization of the left lower extremity due to excessive     edema caused by the mass in the medial thigh.   _____________________________ Janetta Hora. Fields, MD  EM/MEDQ  D:  09/21/2010  T:  09/21/2010  Job:  [id number]

## 2010-09-29 NOTE — Letter (Signed)
Summary: Cardiology- Dr. Viann Fish  Cardiology- Dr. Viann Fish   Imported By: Maryln Gottron 09/24/2010 12:51:47  _____________________________________________________________________  External Attachment:    Type:   Image     Comment:   External Document

## 2010-10-07 ENCOUNTER — Ambulatory Visit (INDEPENDENT_AMBULATORY_CARE_PROVIDER_SITE_OTHER): Payer: 59 | Admitting: Internal Medicine

## 2010-10-07 DIAGNOSIS — D510 Vitamin B12 deficiency anemia due to intrinsic factor deficiency: Secondary | ICD-10-CM

## 2010-10-07 MED ORDER — CYANOCOBALAMIN 1000 MCG/ML IJ SOLN
1000.0000 ug | Freq: Once | INTRAMUSCULAR | Status: AC
Start: 2010-10-07 — End: 2010-10-07
  Administered 2010-10-07: 1000 ug via INTRAMUSCULAR

## 2010-10-11 ENCOUNTER — Encounter: Payer: Self-pay | Admitting: Internal Medicine

## 2010-10-12 ENCOUNTER — Encounter: Payer: Self-pay | Admitting: Internal Medicine

## 2010-10-12 ENCOUNTER — Ambulatory Visit (INDEPENDENT_AMBULATORY_CARE_PROVIDER_SITE_OTHER): Payer: 59 | Admitting: Internal Medicine

## 2010-10-12 DIAGNOSIS — E785 Hyperlipidemia, unspecified: Secondary | ICD-10-CM

## 2010-10-12 DIAGNOSIS — I4891 Unspecified atrial fibrillation: Secondary | ICD-10-CM

## 2010-10-12 DIAGNOSIS — I2510 Atherosclerotic heart disease of native coronary artery without angina pectoris: Secondary | ICD-10-CM

## 2010-10-12 DIAGNOSIS — I10 Essential (primary) hypertension: Secondary | ICD-10-CM

## 2010-10-12 MED ORDER — METOPROLOL TARTRATE 25 MG PO TABS: ORAL_TABLET | ORAL | Status: DC

## 2010-10-12 MED ORDER — WARFARIN SODIUM 5 MG PO TABS: 3.0000 mg | ORAL_TABLET | Freq: Every day | ORAL | Status: DC

## 2010-10-12 NOTE — Progress Notes (Signed)
  Subjective:    Patient ID: Kevin Rubio, male    DOB: 1921-09-19, 76 y.o.   MRN: [id number]  HPI 83 Your old patient who is seen today in follow-up.  He is also followed  closely by cardiology and also followed at the [facility] hospital system.  Recently,  He has what sounds like a cellulitis involving the left upper inner thigh that has resolved.  He has done quite well and denies any cardiopulmonary complaints.  He does monitor his blood pressure and pulse dilated remains on chronic Coumadin anticoagulation now to 3-mg daily dose remains on beta blocker therapy, as well as statin therapy.  He denies any chest pain.  He has a history of RA, which has been stable and also a history of B12 deficiency    Review of Systems  Constitutional: Negative for fever, chills, appetite change and fatigue.  HENT: Negative for hearing loss, ear pain, congestion, sore throat, trouble swallowing, neck stiffness, dental problem, voice change and tinnitus.   Eyes: Negative for pain, discharge and visual disturbance.  Respiratory: Negative for cough, chest tightness, wheezing and stridor.   Cardiovascular: Negative for chest pain, palpitations and leg swelling.  Gastrointestinal: Negative for nausea, vomiting, abdominal pain, diarrhea, constipation, blood in stool and abdominal distention.  Genitourinary: Negative for urgency, hematuria, flank pain, discharge, difficulty urinating and genital sores.  Musculoskeletal: Negative for myalgias, back pain, joint swelling, arthralgias and gait problem.  Skin: Negative for rash.  Neurological: Negative for dizziness, syncope, speech difficulty, weakness, numbness and headaches.  Hematological: Negative for adenopathy. Does not bruise/bleed easily.  Psychiatric/Behavioral: Negative for behavioral problems and dysphoric mood. The patient is not nervous/anxious.        Objective:   Physical Exam  Constitutional: He is oriented to person, place, and time. He appears  well-developed.  HENT:  Head: Normocephalic.  Right Ear: External ear normal.  Left Ear: External ear normal.  Eyes: Conjunctivae and EOM are normal.  Neck: Normal range of motion.  Cardiovascular: Normal rate and normal heart sounds.        Irregular rhythm with controlled ventricular response  Pulmonary/Chest: He has rales.       A few bibasilar rales  Abdominal: Bowel sounds are normal.  Musculoskeletal: Normal range of motion. He exhibits no edema and no tenderness.  Neurological: He is alert and oriented to person, place, and time.  Psychiatric: He has a normal mood and affect. His behavior is normal.          Assessment & Plan:  Coronary artery disease-stable Dyslipidemia Chronic atrial fibrillation Chronic Coumadin anticoagulation.  INR is followed by cardiology History of pernicious anemia  Will continue his present regimen and schedule a complete physical in 6 months

## 2010-10-12 NOTE — Patient Instructions (Signed)
It is important that you exercise regularly, at least 20 minutes 3 to 4 times per week.  If you develop chest pain or shortness of breath seek  medical attention. Limit your sodium (Salt) intake  Return in 6 months for follow-up  Monthly INR

## 2010-11-05 ENCOUNTER — Ambulatory Visit (INDEPENDENT_AMBULATORY_CARE_PROVIDER_SITE_OTHER): Payer: 59 | Admitting: Internal Medicine

## 2010-11-05 DIAGNOSIS — E538 Deficiency of other specified B group vitamins: Secondary | ICD-10-CM

## 2010-11-05 MED ORDER — CYANOCOBALAMIN 1000 MCG/ML IJ SOLN
1000.0000 ug | Freq: Once | INTRAMUSCULAR | Status: AC
  Administered 2010-11-05: 1000 ug via INTRAMUSCULAR

## 2010-11-07 LAB — URINE MICROSCOPIC-ADD ON

## 2010-11-07 LAB — URINALYSIS, ROUTINE W REFLEX MICROSCOPIC
Bilirubin Urine: NEGATIVE
Glucose, UA: NEGATIVE mg/dL
Ketones, ur: NEGATIVE mg/dL
Leukocytes, UA: NEGATIVE
Nitrite: NEGATIVE
Protein, ur: NEGATIVE mg/dL
Specific Gravity, Urine: 1.02 (ref 1.005–1.030)
Urobilinogen, UA: 0.2 mg/dL (ref 0.0–1.0)
pH: 6 (ref 5.0–8.0)

## 2010-11-07 LAB — COMPREHENSIVE METABOLIC PANEL
BUN: 13 mg/dL (ref 6–23)
CO2: 28 mEq/L (ref 19–32)
Calcium: 8.8 mg/dL (ref 8.4–10.5)
Creatinine, Ser: 0.91 mg/dL (ref 0.4–1.5)
GFR calc non Af Amer: >60 mL/min (ref >60)
Glucose, Bld: 112 mg/dL — ABNORMAL HIGH (ref 70–99)
Total Bilirubin: 0.8 mg/dL (ref 0.3–1.2)

## 2010-11-07 LAB — COMPREHENSIVE METABOLIC PANEL WITH GFR
ALT: 27 U/L (ref 0–53)
AST: 38 U/L — ABNORMAL HIGH (ref 0–37)
Albumin: 3.6 g/dL (ref 3.5–5.2)
Alkaline Phosphatase: 90 U/L (ref 39–117)
Chloride: 104 meq/L (ref 96–112)
GFR calc Af Amer: >60 mL/min (ref >60)
Potassium: 4.1 meq/L (ref 3.5–5.1)
Sodium: 136 meq/L (ref 135–145)
Total Protein: 7.5 g/dL (ref 6.0–8.3)

## 2010-11-07 LAB — APTT: aPTT: 37 seconds (ref 24–37)

## 2010-11-07 LAB — TISSUE CULTURE

## 2010-11-07 LAB — CBC
HCT: 45.5 % (ref 39.0–52.0)
Hemoglobin: 15.2 g/dL (ref 13.0–17.0)
MCHC: 33.4 g/dL (ref 30.0–36.0)
MCV: 86.2 fL (ref 78.0–100.0)
Platelets: 77 K/uL — ABNORMAL LOW (ref 150–400)
RBC: 5.28 MIL/uL (ref 4.22–5.81)
RDW: 19.7 % — ABNORMAL HIGH (ref 11.5–15.5)
WBC: 2.6 K/uL — ABNORMAL LOW (ref 4.0–10.5)

## 2010-11-07 LAB — ANAEROBIC CULTURE

## 2010-11-07 LAB — PROTIME-INR
INR: 1.2 (ref 0.00–1.49)
Prothrombin Time: 15.1 seconds (ref 11.6–15.2)

## 2010-11-24 LAB — DIFFERENTIAL
Basophils Absolute: 0 10*3/uL (ref 0.0–0.1)
Eosinophils Absolute: 0.2 10*3/uL (ref 0.0–0.7)
Eosinophils Relative: 10 % — ABNORMAL HIGH (ref 0–5)
Lymphocytes Relative: 33 % (ref 12–46)
Lymphs Abs: 0.7 10*3/uL (ref 0.7–4.0)
Neutrophils Relative %: 41 % — ABNORMAL LOW (ref 43–77)

## 2010-11-24 LAB — CBC
MCV: 80.2 fL (ref 78.0–100.0)
Platelets: 102 10*3/uL — ABNORMAL LOW (ref 150–400)
RDW: 16.3 % — ABNORMAL HIGH (ref 11.5–15.5)
WBC: 2.2 10*3/uL — ABNORMAL LOW (ref 4.0–10.5)

## 2010-11-24 LAB — BONE MARROW EXAM

## 2010-11-24 LAB — CHROMOSOME ANALYSIS, BONE MARROW

## 2010-12-06 ENCOUNTER — Ambulatory Visit (INDEPENDENT_AMBULATORY_CARE_PROVIDER_SITE_OTHER): Payer: 59 | Admitting: Internal Medicine

## 2010-12-06 DIAGNOSIS — D510 Vitamin B12 deficiency anemia due to intrinsic factor deficiency: Secondary | ICD-10-CM

## 2010-12-06 MED ORDER — CYANOCOBALAMIN 1000 MCG/ML IJ SOLN
1000.0000 ug | Freq: Once | INTRAMUSCULAR | Status: AC
  Administered 2010-12-06: 1000 ug via INTRAMUSCULAR

## 2010-12-09 ENCOUNTER — Encounter (HOSPITAL_BASED_OUTPATIENT_CLINIC_OR_DEPARTMENT_OTHER): Payer: 59 | Admitting: Oncology

## 2010-12-09 ENCOUNTER — Other Ambulatory Visit: Payer: Self-pay | Admitting: Oncology

## 2010-12-09 DIAGNOSIS — D696 Thrombocytopenia, unspecified: Secondary | ICD-10-CM

## 2010-12-09 DIAGNOSIS — Z85828 Personal history of other malignant neoplasm of skin: Secondary | ICD-10-CM

## 2010-12-09 DIAGNOSIS — D709 Neutropenia, unspecified: Secondary | ICD-10-CM

## 2010-12-09 DIAGNOSIS — M069 Rheumatoid arthritis, unspecified: Secondary | ICD-10-CM

## 2010-12-09 LAB — CBC WITH DIFFERENTIAL/PLATELET
BASO%: 0.5 % (ref 0.0–2.0)
EOS%: 3.1 % (ref 0.0–7.0)
HCT: 45.8 % (ref 38.4–49.9)
LYMPH%: 39.1 % (ref 14.0–49.0)
MCH: 32.7 pg (ref 27.2–33.4)
MCHC: 35 g/dL (ref 32.0–36.0)
MONO#: 0.4 10*3/uL (ref 0.1–0.9)
MONO%: 19.1 % — ABNORMAL HIGH (ref 0.0–14.0)
NEUT%: 38.2 % — ABNORMAL LOW (ref 39.0–75.0)
Platelets: 61 10*3/uL — ABNORMAL LOW (ref 140–400)
RBC: 4.89 10*6/uL (ref 4.20–5.82)
WBC: 2.2 10*3/uL — ABNORMAL LOW (ref 4.0–10.3)

## 2010-12-09 LAB — COMPREHENSIVE METABOLIC PANEL
ALT: 20 U/L (ref 0–53)
AST: 32 U/L (ref 0–37)
Alkaline Phosphatase: 70 U/L (ref 39–117)
Creatinine, Ser: 1.01 mg/dL (ref 0.40–1.50)
Sodium: 140 mEq/L (ref 135–145)
Total Bilirubin: 1 mg/dL (ref 0.3–1.2)
Total Protein: 7.5 g/dL (ref 6.0–8.3)

## 2010-12-14 ENCOUNTER — Ambulatory Visit (INDEPENDENT_AMBULATORY_CARE_PROVIDER_SITE_OTHER): Payer: 59 | Admitting: Internal Medicine

## 2010-12-14 ENCOUNTER — Encounter: Payer: Self-pay | Admitting: Internal Medicine

## 2010-12-14 DIAGNOSIS — I4891 Unspecified atrial fibrillation: Secondary | ICD-10-CM

## 2010-12-14 DIAGNOSIS — I2510 Atherosclerotic heart disease of native coronary artery without angina pectoris: Secondary | ICD-10-CM

## 2010-12-14 DIAGNOSIS — I10 Essential (primary) hypertension: Secondary | ICD-10-CM

## 2010-12-14 DIAGNOSIS — K4090 Unilateral inguinal hernia, without obstruction or gangrene, not specified as recurrent: Secondary | ICD-10-CM

## 2010-12-14 NOTE — Patient Instructions (Signed)
Gen. surgical consultation as scheduled Call if abdominal pain worsens or you develop nausea or vomiting  Call or return to clinic prn if these symptoms worsen or fail to improve as anticipated.

## 2010-12-14 NOTE — Progress Notes (Signed)
  Subjective:    Patient ID: Kevin Rubio, male    DOB: November 13, 1921, 77 y.o.   MRN: [id number]  HPI  77 year old patient who presents with increasing pain and swelling involving a hernia in the right groin area. He states one day after heavy lifting he had the onset of pain and swelling in his right groin. He has also noted some more generalized abdominal discomfort. He has noted no change in his bowel habits and denies any nausea or vomiting. He states the hernia has approximately doubled in size    Review of Systems  Constitutional: Negative for fever, chills, appetite change and fatigue.  HENT: Negative for hearing loss, ear pain, congestion, sore throat, trouble swallowing, neck stiffness, dental problem, voice change and tinnitus.   Eyes: Negative for pain, discharge and visual disturbance.  Respiratory: Negative for cough, chest tightness, wheezing and stridor.   Cardiovascular: Negative for chest pain, palpitations and leg swelling.  Gastrointestinal: Positive for abdominal pain. Negative for nausea, vomiting, diarrhea, constipation, blood in stool and abdominal distention.  Genitourinary: Negative for urgency, hematuria, flank pain, discharge, difficulty urinating and genital sores.  Musculoskeletal: Negative for myalgias, back pain, joint swelling, arthralgias and gait problem.  Skin: Negative for rash.  Neurological: Negative for dizziness, syncope, speech difficulty, weakness, numbness and headaches.  Hematological: Negative for adenopathy. Does not bruise/bleed easily.  Psychiatric/Behavioral: Negative for behavioral problems and dysphoric mood. The patient is not nervous/anxious.        Objective:   Physical Exam  Constitutional: He appears well-developed and well-nourished. No distress.  Abdominal: Soft. Bowel sounds are normal.       The patient had a prominent approximately 10 cm hernia in the right groin region. With some difficulty this was reduced with resolution of his  discomfort          Assessment & Plan:   Symptomatic right inguinal hernia. We'll set up for a surgical consultation. If they feel he is high risk for incarceration may consider elective surgery in spite of his comorbidities Coronary artery disease Hypertension Chronic Coumadin anticoagulation

## 2011-01-04 NOTE — Discharge Summary (Signed)
Kevin Rubio, Kevin Rubio                 ACCOUNT NO.:  [id number]   MEDICAL RECORD NO.:  [id number]          PATIENT TYPE:  INP   LOCATION:  3731                         FACILITY:  [facility]   PHYSICIAN:  Sandford Craze, NP DATE OF BIRTH:  1922/02/06   DATE OF ADMISSION:  12/10/2007  DATE OF DISCHARGE:  12/14/2007                               DISCHARGE SUMMARY   DISCHARGE DIAGNOSES:  1. Urinary tract infection with sepsis/shock in the setting of      pansensitive Escherichia coli urinary tract infection.  2. Hypotension, secondary to shock.  3. History of atrial fibrillation on chronic Coumadin with      supratherapeutic INR.  During this admission, hemodynamically      stable.  4. History of coronary artery disease.  5. Hyperlipidemia.  6. History of B12 deficiency.  7. History of rheumatoid arthritis.  8. Benign prostatic hypertrophy.   COURSE OF HOSPITALIZATION AND HISTORY OF PRESENT ILLNESS:  Kevin Rubio is  an 77 year old white male admitted on December 10, 2007, with chief  complaint of not feeling well for several weeks.  He noted chills and  difficulty getting out of bed, and he was associated with some cough,  dysuria, and feeling thirsty.  He developed a need for assistance with  transfers due to progressive weakness.  He also noted a poor appetite  and was admitted for further evaluation and treatment.   PAST MEDICAL HISTORY:  1. Coronary artery disease status post MI in 1992.  2. Hyperlipidemia.  3. Hypertension.  4. BPH.  5. Rheumatoid arthritis.  6. Myocardial infarction history 1982.  7. Chronic anticoagulation for atrial fibrillation.  8. History of B12 deficiency.   COURSE OF HOSPITALIZATION AND PROBLEM:  E. coli UTI with sepsis/shock.  The patient was admitted and was noted to be hypotensive on admission  with systolic pressure in the 70s.  He was given IV fluids.  He did not  require initiation of pressors during this admission.  Surprisingly  despite clinical  picture, the patient did not appear toxic at all during  this admission.  Blood cultures sent at the time of admission remain no  growth to date at the time of this dictation.  Urine culture sent on  December 10, 2007, grew greater than 100,000 colonies of pansensitive E.  coli.  The patient's blood pressure remained stable with a pressure of  106/70 at time of discharge; however, we have continued to hold blood  pressure medications.  We will hold BP meds at the time of discharge and  defer reintroducing these meds to his primary care Margorie Renner, Dr. Eleonore Chiquito to his follow up appointment next week.   The patient was treated with IV vancomycin and IV Zosyn, which was  successfully transitioned over to oral Ceftin during this admission.   MEDICATIONS:  At time of discharge:  1. Coumadin 5 mg p.o. daily alternating with 2.5 mg.  2. Crestor 10 mg p.o. daily.  3. Pred Forte drops daily, nightly.  4. Vitamin C 1 tablet p.o. daily.  5. Centrum Silver 1 tablet p.o. daily.  6.  Fish oil tablet 1 tablet p.o. daily.  7. Mucinex 1 tablet 600 mg p.o. daily.  8. Niaspan 500 mg p.o. b.i.d.  9. Cobal injection B12 IM once monthly.  10.Ceftin 500 mg p.o. b.i.d. through December 20, 2007, then stop.  11.The patient is to hold nifedipine and metoprolol until further      instructions per primary MD.   PERTINENT LABORATORY DATA:  At time of discharge, hemoglobin 10.7,  hematocrit 32.3, BUN 12, and creatinine 1.17.   DISPOSITION:  The patient will be discharged to home.   FOLLOWUP:  He is to follow up with Dr. Eleonore Chiquito next week.      Sandford Craze, NP     MO/MEDQ  D:  12/14/2007  T:  12/15/2007  Job:  [id number]   cc:   Gordy Savers, MD

## 2011-01-04 NOTE — H&P (Signed)
NAMEETIENNE, MOWERS NO.:  [id number]   MEDICAL RECORD NO.:  [id number]          PATIENT TYPE:  INP   LOCATION:  1824                         FACILITY:  [facility]   PHYSICIAN:  Lowella Bandy, MD      DATE OF BIRTH:  04-Nov-1921   DATE OF ADMISSION:  05/20/2007  DATE OF DISCHARGE:                              HISTORY & PHYSICAL   PRIMARY CARE Jamail Cullers:  Gordy Savers, MD.   CARDIOLOGIST:  Georga Hacking, M.D.   CHIEF COMPLAINT:  Near syncope.   HISTORY OF PRESENT ILLNESS:  Mr. Karnes is an 77 year old male with a  history of coronary artery disease and previous MI and a questionable  history of paroxysmal atrial fibrillation who had a near syncopal event  earlier at home today.  He reports that he awoke this morning and felt  somewhat weak, but denies any focal weakness or numbness, he states that  he feels this way sometimes, and his wife went on to church.  Later that  morning after she returned, after he arose from his recliner and walked  towards the kitchen, he became very lightheaded and felt like he was  going to pass out, feeling some degree of a warm sensation.  His wife  witnessed the event and states that he placed his hand on the wall and  she, along with his help, guided him down to a chair.  He denies any  frank loss of consciousness, and his wife denies that he had any  confusion, loss of consciousness or focal neurologic symptoms.  He  denies any associated chest pain or shortness of breath.  He and his  wife called emergency medical services and there is a question whether  or not he may have been bradycardic when the EMS arrived, although this  is not documented.   PAST MEDICAL HISTORY:  1. Coronary artery disease:  Status post remote MI in 1982.  Status      post PCI in 1997.  I do not have details as to his coronary anatomy      or his ejection fraction at this time.  2. Possible history of paroxysmal atrial fibrillation.  The  patient's      wife states that a couple of years ago he had some type of rhythm      problem addressed by Dr. Donnie Aho that sounds like it may have been      paroxysmal atrial fibrillation at that time.  He has not been on      Coumadin recently.  He was on Coumadin incidentally for a couple of      years following his MI in the 23s.  3. Hypertension.  4. Hyperlipidemia.  5. Anemia, etiology uncertain.  6. BPH.  7. Rheumatoid arthritis.  8. Status post cholecystectomy.   MEDICATIONS:  1. Propranolol 80 mg p.o. once a day.  2. Nifedipine 30 mg p.o. once a day.  3. Digoxin 0.125 mg p.o. once a day.  4. Aspirin 325 mg p.o. once a day.  5. Vytorin 10/20 one-half tablet once a  day.   ALLERGIES:  NO KNOWN DRUG ALLERGIES.   SOCIAL HISTORY:  Patient quit smoking in 1982.  He denies any alcohol  use.   FAMILY HISTORY:  Negative for any premature coronary artery disease.   REVIEW OF SYSTEMS:  Positive for anemia for which he has been getting  B12 injections.  He denies any melena or hematochezia.  This is being  followed by Dr. Amador Cunas.  Otherwise, 10 systems reviewed and  negative other than as noted above in the HPI.   PHYSICAL EXAMINATION:  VITAL SIGNS:  Blood pressure 127/64, pulse 60,  oxygen saturation 96%.  GENERAL:  Patient is a pleasant white male breathing comfortably in no  apparent distress.  HEENT:  Normocephalic and atraumatic.  Extraocular movements intact.  Oropharynx is clear.  NECK:  Supple, no masses appreciated.  No JVD or carotid bruits.  CARDIOVASCULAR:  Regular rate and rhythm, no murmurs, rubs or gallops,  distant heart sounds.  CHEST:  Clear to auscultation bilaterally.  ABDOMEN:  Soft, nontender, nondistended.  EXTREMITIES:  Warm, no edema.  NEUROLOGIC:  Cranial nerves II-XII are intact, strength 5/5 globally  with no focal deficits.  PSYCHIATRIC:  Alert and oriented x3.  Affect pleasant and appropriate.  MUSCULOSKELETAL:  Severe arthritic  abnormalities in his hands.  No joint  effusions or erythema.  SKIN:  Small erythematous macular lesions on his lower back, otherwise  no eruptions.   LABORATORY DATA:  EKG reviewed, initial EKG showed atrial fibrillation  at 55 beats per minute with a nonspecific T-wave abnormality.  Repeat  EKG showed sinus rhythm at 72 beats per minute.   Laboratory values reviewed:  White blood cell count 2.5 (chronically  low), hemoglobin 13.5, platelets 101 (chronically low).   Sodium 134, potassium 4.4, chloride 103, bicarb 26, BUN 15, creatinine  1, glucose 116.   Digoxin level 0.7, troponin less than 0.05, CK-MB 1.7.   ASSESSMENT:  This is an 77 year old male with coronary artery disease  and a questionable history of paroxysmal atrial fibrillation who  presents with a near syncopal event and initial EKG here showed atrial  fibrillation with a heart rate in the 50s.  It is possible that this  event was secondary to bradycardia or pauses.  He does not have any  signs or symptoms to suggest a neurologic etiology, and he does not  appear to be orthostatic.  He spontaneously converted to sinus rhythm.   PLAN:  1. We will admit him to Dr. Karleen Hampshire Tilley's service and watch him on      telemetry.  We will hold his beta blocker and digoxin for now and      see what his heart rate and rhythm do.  We will also check an      echocardiogram.  2. We will continue nifedipine for his hypertension and Vytorin for      his hyperlipidemia, which are his home medications.  3. Given the recurrence of paroxysmal atrial fibrillation, Dr. Donnie Aho      can address the issue of anticoagulation longterm.  This may be      somewhat complicated given the patient's reported history of      anemia.  We will not      start heparin at this time.  4. Of note the patient has a chronically low white blood cell count      and platelet count, as well as a history of anemia, and this is      being evaluated  by Dr.  Amador Cunas.      Lowella Bandy, MD  Electronically Signed     JJC/MEDQ  D:  05/20/2007  T:  05/20/2007  Job:  [id number]

## 2011-01-04 NOTE — Discharge Summary (Signed)
NAMEANA, LIAW                 ACCOUNT NO.:  [id number]   MEDICAL RECORD NO.:  [id number]          PATIENT TYPE:  OBV   LOCATION:  3715                         FACILITY:  [facility]   PHYSICIAN:  Georga Hacking, M.D.DATE OF BIRTH:  1922-08-21   DATE OF ADMISSION:  05/20/2007  DATE OF DISCHARGE:  05/24/2007                               DISCHARGE SUMMARY   FINAL DIAGNOSES:  1. Paroxysmal atrial fibrillation with resolution  2. Near-syncope associated with atrial fibrillation.  3. Coronary artery disease with (a) previous anterior myocardial      infarction with occlusion of the left anterior descending, (b)      previous percutaneous coronary intervention of the right coronary      artery.  4. Hypertension.  5. Hyperlipidemia.  6. Thrombocytopenia and neutropenia of uncertain etiology.  7. Rheumatoid arthritis.  8. Benign prostatic hypertrophy.  9. History of B12 deficiency.   PROCEDURE:  Echocardiogram.   HISTORY OF PRESENT ILLNESS:  The patient has a previous history of  coronary artery disease and paroxysmal atrial fibrillation in the past.  He awoke the morning of admission, felt weak and went to church.  After  going home after rising from the recliner and walking towards the  kitchen, he became lightheaded and felt like he was going to pass out  and felt a degree of a warm sensation.  His wife witnessed the event and  stated he placed his hand on the wall and she along with his help got  him into a chair.  He had no frank loss of consciousness, had no  associated chest pain or shortness of breath.  EMS was called and  transported him to the emergency room.  Please see previously dictated  history and physical for remainder of the details.   HOSPITAL COURSE:  The patient was in atrial fibrillation when he was in  the emergency room and converted later subsequently to sinus rhythm.  He  had recurrent paroxysmal atrial fibrillation while in the hospital.  Initially,  digoxin and propranolol were held.  Digoxin level was 0.7.  Chemistry panel showed a sodium of 134, potassium 4.4, chloride 103, CO2  26, BUN 15, creatinine of 1.0, glucose of 116.  Platelet count was  101,000, white count was 2500, hemoglobin is 13.5, hematocrit is 40.  Serial cardiac enzymes were normal.  EKG showed atrial fibrillation with  controlled response with nonspecific ST abnormality.   The patient initially had an EKG showing atrial fibrillation, slow  ventricular response, and converted back to sinus rhythm.  He had no  evidence of orthostasis and had no recurrence of slow heart rate while  he was in the hospital.  He did have recurrent paroxysmal atrial  fibrillation and was placed on warfarin.  Metoprolol was restarted 25 mg  twice daily.  Vytorin and nifedipine were continued at home.  The  patient did complain of a burning pain in his right posterior flank area  and was felt to have a rash of shingles.  He was started on Valtrex.  He  was initiated on Coumadin.  His  INR was 1.7 the day of discharge.  He  felt well following this and was ambulatory in the hall without further  syncope, orthostasis.  He received Coumadin anticoagulation prior to  discharge.   DISCHARGE MEDICATIONS:  1. Warfarin 5 mg alternating with 1/2 mg daily.  Discharge INR was      1.7.  He is have a followup INR on Monday at my office.  2. Vytorin 10/20 mg 1/2 tablet daily.  3. Nifedipine extended release 30 mg daily.  4. Valtrex 500 mg b.i.d. for 5 more days.  5. Niaspan 500 mg nightly.  6. Metoprolol 25 mg b.i.d.   FOLLOW UP:  He is to see me in followup in 1 week after he has his  protime and is to see me in 1 week following the protime.   DISCHARGE INSTRUCTIONS:  He is to discontinue aspirin, propranolol, and  Lanoxin.  He is to call if he has recurrent syncope or issues.      Georga Hacking, M.D.  Electronically Signed     WST/MEDQ  D:  05/24/2007  T:  05/24/2007  Job:   [id number]   cc:   Gordy Savers, MD

## 2011-01-05 ENCOUNTER — Ambulatory Visit (INDEPENDENT_AMBULATORY_CARE_PROVIDER_SITE_OTHER): Payer: 59 | Admitting: Internal Medicine

## 2011-01-05 DIAGNOSIS — D510 Vitamin B12 deficiency anemia due to intrinsic factor deficiency: Secondary | ICD-10-CM

## 2011-01-05 MED ORDER — CYANOCOBALAMIN 1000 MCG/ML IJ SOLN
1000.0000 ug | Freq: Once | INTRAMUSCULAR | Status: AC
  Administered 2011-01-05: 1000 ug via INTRAMUSCULAR

## 2011-01-07 NOTE — Op Note (Signed)
[facility]  Patient:    Kevin Rubio, Kevin Rubio Visit Number: [id number] MRN: [id number]          Service Type: MED Location: 3W 0375 01 Attending Physician:  Norman Clay Dictated by:   Sheppard Plumber Earlene Plater, M.D. Proc. Date: 11/09/01 Admit Date:  11/08/2001 Discharge Date: 11/10/2001   CC:         Darden Palmer., M.D.   Operative Report  PREOPERATIVE DIAGNOSIS:  Acute and chronic cholecystolithiasis.  POSTOPERATIVE DIAGNOSIS:  Acute and chronic cholecystolithiasis.  PROCEDURE:  Laparoscopic cholecystectomy.  SURGEON:  Timothy E. Earlene Plater, M.D.  ASSISTANT:  Donnie Coffin. Samuella Cota, M.D.  ANESTHESIA:  General, Dr. Gladis Riffle.  INDICATION:  [name] was admitted by the cardiac service, Dr. Donnie Aho, was evaluated for atypical chest pain, found to have acute cholecystitis.  The patient was counseled and agreed to proceed with surgery at this time. Laboratory data were normal.  DESCRIPTION OF PROCEDURE:  The patient brought to the operating room, evaluated by anesthesia, placed supine.  General endotracheal anesthesia administered.  The abdomen was scrubbed, prepped, and draped in the usual fashion.  Marcaine 0.25% with epinephrine was used throughout prior to each incision.  An infraumbilical incision made, the peritoneum entered without complication, Hasson catheter placed and tied in place with a #1 Vicryl.  The abdomen insufflated.  Peritoneoscopy showed a right inguinal hernia, some adhesions in the left lower quadrant, and a tense, distended gallbladder with multiple stones.  A second 10 mm trocar was placed in the mid epigastrium, two 5 mm trocars in the right upper quadrant.  The gallbladder was aspirated, grasped, and placed on tension.  A large anterior cystic artery was dissected going up onto the body of the gallbladder, triply clipped, and divided.  The cystic duct was then isolated and dissected out completely, clipped near  the gallbladder, doubly just proximally, and divided.  An additional small posterior artery was doubly clipped and divided.  The gallbladder was removed from the gallbladder bed without incident or complication.  There was considerable edema.  There was bleeding or bile.  The bed was inspected and irrigated.  The gallbladder was placed in an EndoCatch bag, brought up to the inferior umbilical incision, opened; the stones were extracted, and the gallbladder removed in the bag.  That wound was closed with a #1 Vicryl and irrigated.  Further irrigation was carried out intra-abdominally and was clear.  All irrigant, CO2, trocars and instruments removed under direct vision.  The counts were correct.  The wounds were inspected and closed with Monocryl.  Dry sterile dressings over Steri-Strips were applied.  He tolerated it well, was awakened, and taken to the recovery room in good condition. Dictated by:   Sheppard Plumber Earlene Plater, M.D. Attending Physician:  Norman Clay DD:  11/09/01 TD:  11/11/01 Job: [id number]

## 2011-01-07 NOTE — H&P (Signed)
[facility]  Patient:    Kevin Rubio, Kevin Rubio Visit Number: [id number] MRN: [id number]          Service Type: MED Location: 3W 0375 01 Attending Physician:  Norman Clay Dictated by:   Meade Maw, M.D. Admit Date:  11/08/2001   CC:         Darden Palmer., M.D.   History and Physical  PRIMARY CARDIOLOGIST:  Darden Palmer., M.D.  INDICATION FOR ADMISSION:  Atypical chest pain.  HISTORY OF PRESENT ILLNESS:  Kevin Rubio is a pleasant 77 year old gentleman with known coronary artery disease, who presents to the [facility] ER with complaints of epigastric heaviness.  This pain is different from his previous cardiac pain which was described a left chest wall pain radiating down to the arm and associated with nausea, vomiting, and diaphoresis. Mr. Bublitz has no associated symptoms with this pain.  The pain started at approximately 11 p.m. while in bed.  There was no specific aggravating or alleviating factors noted.  The pain gradually resolved from a 10/10 to a 1-2/10.  He took his sublingual nitroglycerin at home with minimal relief. Additional nitroglycerin given in the ER with subsequent gradual resolution of the chest pain.  The pain is not aggravated by moving, cough, or deep breathing.  PAST MEDICAL HISTORY:  Significant for: 1. Coronary artery disease as noted above. 2. Paroxysmal atrial fibrillation. 3. Hypertension. 4. Dyslipidemia.  The exact details of his past medical history are unknown.  The patient is somewhat of a poor historian and old charts were not available for review.  PAST SURGICAL HISTORY:  Significant for repair of a deviated nasal septum.  CURRENT MEDICATIONS: 1. Procardia. 2. Inderal. 3. Zocor. 4. Aspirin. 5. Tricor.  The exact dosage is not available.  The patient did not bring    medications with him to the emergency room.  ALLERGIES:  He has no known drug allergies.  SOCIAL  HISTORY:  He is married and lives with his wife in [place], [place].  There is no history of tobacco, alcohol, or illicit drug use.  REVIEW OF SYSTEMS:  He denies palpitations and tachyarrhythmias.  No orthopnea.  No PND.  No pedal edema.  No change in bowel or bladder habits. No blood noted in the stool.  PHYSICAL EXAMINATION:  An elderly pleasant male who is hard of hearing.  He is in no acute distress.  HEENT:  No evidence of neck vein distention or carotid bruit.  His thyroid is not palpable.  PULMONARY:  Breath sounds are equal and clear to auscultation.  No use of accessory muscles.  CARDIOVASCULAR:  Normal S1 and normal S2.  Regular rate and rhythm.  No murmurs, rubs, or gallops noted.  ABDOMEN:  Soft.  There is no significant tenderness to palpation.  No unusual bruits or pulsations are noted.  EXTREMITIES:  No peripheral edema.  His DPs are 1+.  His PTs are not palpable.  SKIN:  Warm and dry.  NEUROLOGIC:  Nonfocal.  Motor is 5/5 throughout.  LABORATORY DATA:  His ECG reveals a normal sinus rhythm.  No ST changes noted.  His troponin I is 0.01.  His initial CK is 52 and CK-MB 1.2.  The white count is 3.7, hematocrit 41, and platelet count 136,000.  Normal electrolytes. Normal liver enzymes.  Normal bilirubin.  The chest x-ray reveals no acute disease.  The heart size is within normal limits.  Telemetry reveals sinus bradycardia.  IMPRESSION: 1. An elderly gentleman with known coronary artery disease who presents to the    emergency room with atypical chest pain.  The symptoms are more consistent    with a GI etiology.  His initial cardiac enzymes, ECG, and chest x-ray are    negative.  We will admit for observation on a rule out protocol in view of    his history of known coronary artery disease.  Will obtain an amylase and    lipase in view of his history of dyslipidemia.  Initiate Protonix 40 mg    daily, morphine p.r.n., and Tylenol p.r.n.  Further  management per his    primary cardiologist. 2. Hypertension.  The blood pressure is currently well controlled. 3. Paroxysmal atrial fibrillation.  He is currently maintaining sinus rhythm.    Will defer long-term anticoagulation to his primary care physician. Dictated by:   Meade Maw, M.D. Attending Physician:  Norman Clay DD:  11/08/01 TD:  11/08/01 Job: [id number]

## 2011-01-07 NOTE — Assessment & Plan Note (Signed)
[facility] OFFICE NOTE   Kevin Rubio, Kevin Rubio                        MRN:          [id number]  DATE:09/25/2006                            DOB:          12-01-1921    This is an 77 year old male seen today for an annual assessment.  He has  a history of coronary artery disease, hypertension and dyslipidemia.  He  is followed by Cardiology.  He is doing well today without concerns or  complaints.  He has a history also of BPH, quiescent rheumatoid  arthritis.  He was hospitalized in 1982 for an MI.  He did have a PIC in  1997.  In 2003, he had a laparoscopic cholecystectomy.   REVIEW OF SYSTEMS:  Exam was negative.  No cardiopulmonary complaints.  The only real complaint today is some dry skin.   FAMILY HISTORY:  1. Father died at 42 of tuberculosis.  2. Mother died at 6 of cardiac disease.   EXAM:  An elderly white male in no acute distress.  Blood pressure is  low normal.  FUNDI/ENT:  Unremarkable.  He did have a dry crusted lesion involving  his right external ear that was improving.  Skin was generally dry with  some patchy areas of dry, thickened plaques.  NECK:  No bruits.  CHEST:  Clear.  CARDIOVASCULAR:  Normal heart sounds, no murmurs.  Heart sounds were  faint.  ABDOMEN:  Benign, no organomegaly.  EXTERNAL GENITALIA:  Normal.  RECTAL:  Prostate +3, enlarged and benign.  Stool heme negative.  EXTREMITIES:  Revealed the left posterior tibial and the right dorsalis  pedis pulse to be diminished.  The patient had joint deformities of his  hands consistent with his RA with ulnar deviation.   IMPRESSION:  1. Coronary artery disease.  2. Dyslipidemia.  3. Hypertension.   DISPOSITION:  He wishes to switch to a generic and simvastatin will be  substituted for the Vytorin.  Otherwise, his medical regimen will be  unchanged.  Will reassess in 6 months.     Gordy Savers, MD  Electronically  Signed    PFK/MedQ  DD: 09/25/2006  DT: 09/25/2006  Job #: [id number]

## 2011-01-25 ENCOUNTER — Encounter (HOSPITAL_COMMUNITY)
Admission: RE | Admit: 2011-01-25 | Discharge: 2011-01-25 | Disposition: A | Discharge status: Home / Self Care | Payer: Medicare Other | Source: Ambulatory Visit | Attending: General Surgery | Admitting: General Surgery

## 2011-01-25 ENCOUNTER — Ambulatory Visit (HOSPITAL_COMMUNITY)
Admission: RE | Admit: 2011-01-25 | Discharge: 2011-01-25 | Disposition: A | Discharge status: Home / Self Care | Payer: Medicare Other | Source: Ambulatory Visit | Attending: General Surgery | Admitting: General Surgery

## 2011-01-25 ENCOUNTER — Other Ambulatory Visit: Payer: Self-pay | Admitting: General Surgery

## 2011-01-25 DIAGNOSIS — Z01818 Encounter for other preprocedural examination: Secondary | ICD-10-CM | POA: Insufficient documentation

## 2011-01-25 DIAGNOSIS — Z01812 Encounter for preprocedural laboratory examination: Secondary | ICD-10-CM | POA: Insufficient documentation

## 2011-01-25 DIAGNOSIS — K4090 Unilateral inguinal hernia, without obstruction or gangrene, not specified as recurrent: Secondary | ICD-10-CM

## 2011-01-25 DIAGNOSIS — I10 Essential (primary) hypertension: Secondary | ICD-10-CM | POA: Insufficient documentation

## 2011-01-25 LAB — DIFFERENTIAL
Basophils Relative: 0 % (ref 0–1)
Eosinophils Absolute: 0.1 10*3/uL (ref 0.0–0.7)
Lymphs Abs: 0.7 10*3/uL (ref 0.7–4.0)
Monocytes Absolute: 0.4 10*3/uL (ref 0.1–1.0)
Monocytes Relative: 17 % — ABNORMAL HIGH (ref 3–12)
Neutrophils Relative %: 54 % (ref 43–77)

## 2011-01-25 LAB — CBC
MCH: 33.1 pg (ref 26.0–34.0)
MCHC: 36.4 g/dL — ABNORMAL HIGH (ref 30.0–36.0)
MCV: 90.7 fL (ref 78.0–100.0)
Platelets: 58 10*3/uL — ABNORMAL LOW (ref 150–400)
RBC: 5.08 MIL/uL (ref 4.22–5.81)

## 2011-01-25 LAB — BASIC METABOLIC PANEL
CO2: 29 mEq/L (ref 19–32)
Chloride: 104 mEq/L (ref 96–112)
Creatinine, Ser: 0.93 mg/dL (ref 0.4–1.5)
GFR calc Af Amer: >60 mL/min (ref >60)
Glucose, Bld: 109 mg/dL — ABNORMAL HIGH (ref 70–99)
Sodium: 140 mEq/L (ref 135–145)

## 2011-01-25 LAB — PROTIME-INR
INR: 1.25 (ref 0.00–1.49)
Prothrombin Time: 15.9 seconds — ABNORMAL HIGH (ref 11.6–15.2)

## 2011-01-25 LAB — APTT: aPTT: 36 seconds (ref 24–37)

## 2011-01-25 LAB — SURGICAL PCR SCREEN: Staphylococcus aureus: POSITIVE — AB

## 2011-01-27 ENCOUNTER — Ambulatory Visit (HOSPITAL_COMMUNITY)
Admission: RE | Admit: 2011-01-27 | Discharge: 2011-01-27 | Disposition: A | Discharge status: Home / Self Care | Payer: Medicare Other | Source: Ambulatory Visit | Attending: General Surgery | Admitting: General Surgery

## 2011-01-27 ENCOUNTER — Emergency Department (HOSPITAL_COMMUNITY)
Admission: EM | Admit: 2011-01-27 | Discharge: 2011-01-28 | Disposition: A | Discharge status: Home / Self Care | Payer: Medicare Other | Attending: Emergency Medicine | Admitting: Emergency Medicine

## 2011-01-27 DIAGNOSIS — I4891 Unspecified atrial fibrillation: Secondary | ICD-10-CM | POA: Insufficient documentation

## 2011-01-27 DIAGNOSIS — R300 Dysuria: Secondary | ICD-10-CM | POA: Insufficient documentation

## 2011-01-27 DIAGNOSIS — Z7901 Long term (current) use of anticoagulants: Secondary | ICD-10-CM | POA: Insufficient documentation

## 2011-01-27 DIAGNOSIS — I252 Old myocardial infarction: Secondary | ICD-10-CM | POA: Insufficient documentation

## 2011-01-27 DIAGNOSIS — I2510 Atherosclerotic heart disease of native coronary artery without angina pectoris: Secondary | ICD-10-CM | POA: Insufficient documentation

## 2011-01-27 DIAGNOSIS — E785 Hyperlipidemia, unspecified: Secondary | ICD-10-CM | POA: Insufficient documentation

## 2011-01-27 DIAGNOSIS — K4090 Unilateral inguinal hernia, without obstruction or gangrene, not specified as recurrent: Secondary | ICD-10-CM | POA: Insufficient documentation

## 2011-01-27 DIAGNOSIS — M069 Rheumatoid arthritis, unspecified: Secondary | ICD-10-CM | POA: Insufficient documentation

## 2011-01-27 DIAGNOSIS — I10 Essential (primary) hypertension: Secondary | ICD-10-CM | POA: Insufficient documentation

## 2011-01-27 DIAGNOSIS — Z9861 Coronary angioplasty status: Secondary | ICD-10-CM | POA: Insufficient documentation

## 2011-01-27 DIAGNOSIS — Z79899 Other long term (current) drug therapy: Secondary | ICD-10-CM | POA: Insufficient documentation

## 2011-01-27 DIAGNOSIS — R319 Hematuria, unspecified: Secondary | ICD-10-CM | POA: Insufficient documentation

## 2011-01-27 DIAGNOSIS — D510 Vitamin B12 deficiency anemia due to intrinsic factor deficiency: Secondary | ICD-10-CM | POA: Insufficient documentation

## 2011-01-27 HISTORY — PX: LAPAROSCOPIC INGUINAL HERNIA REPAIR: SUR788

## 2011-01-27 LAB — URINALYSIS, ROUTINE W REFLEX MICROSCOPIC
Bilirubin Urine: NEGATIVE
Ketones, ur: NEGATIVE mg/dL
Nitrite: NEGATIVE
pH: 7 (ref 5.0–8.0)

## 2011-01-27 LAB — DIFFERENTIAL
Basophils Absolute: 0 10*3/uL (ref 0.0–0.1)
Basophils Relative: 0 % (ref 0–1)
Eosinophils Relative: 1 % (ref 0–5)
Lymphocytes Relative: 23 % (ref 12–46)
Monocytes Absolute: 0.5 10*3/uL (ref 0.1–1.0)

## 2011-01-27 LAB — PROTIME-INR: INR: 1.19 (ref 0.00–1.49)

## 2011-01-27 LAB — CBC
HCT: 43.6 % (ref 39.0–52.0)
MCH: 33.1 pg (ref 26.0–34.0)
MCHC: 36.5 g/dL — ABNORMAL HIGH (ref 30.0–36.0)
RDW: 14.4 % (ref 11.5–15.5)

## 2011-01-27 LAB — URINE MICROSCOPIC-ADD ON

## 2011-02-21 ENCOUNTER — Encounter (INDEPENDENT_AMBULATORY_CARE_PROVIDER_SITE_OTHER): Payer: Self-pay | Admitting: General Surgery

## 2011-02-21 ENCOUNTER — Ambulatory Visit (INDEPENDENT_AMBULATORY_CARE_PROVIDER_SITE_OTHER): Payer: Medicare Other | Admitting: Internal Medicine

## 2011-02-21 DIAGNOSIS — D510 Vitamin B12 deficiency anemia due to intrinsic factor deficiency: Secondary | ICD-10-CM

## 2011-02-21 MED ORDER — CYANOCOBALAMIN 1000 MCG/ML IJ SOLN
1000.0000 ug | Freq: Once | INTRAMUSCULAR | Status: AC
  Administered 2011-02-21: 1000 ug via INTRAMUSCULAR

## 2011-02-24 ENCOUNTER — Encounter (INDEPENDENT_AMBULATORY_CARE_PROVIDER_SITE_OTHER): Payer: Self-pay | Admitting: General Surgery

## 2011-02-24 ENCOUNTER — Ambulatory Visit (INDEPENDENT_AMBULATORY_CARE_PROVIDER_SITE_OTHER): Payer: Medicare Other | Admitting: General Surgery

## 2011-02-24 DIAGNOSIS — K4090 Unilateral inguinal hernia, without obstruction or gangrene, not specified as recurrent: Secondary | ICD-10-CM

## 2011-02-24 NOTE — Progress Notes (Signed)
Procedure: Laparoscopic repair of right direct inguinal hernia with mesh January 27, 2011  History of Present Ilness: Patient is an 77 year old male who underwent the above-mentioned procedure he comes in today for his first postoperative visit. He had acute urinary retention postoperatively prompting him to come back to the emergency room for Foley catheter placement. He was also started on Flomax. He saw Dr. Shiela Mayer at [facility] urology. His Foley was ultimately removed. He denies any trouble with urination currently. He denies any fever, chills, nausea, vomiting, diarrhea, or constipation. He denies any numbness or tingling in his groin. He denies any pain in his right groin.  Physical Exam: Well-developed well-nourished elderly appearing Caucasian male in no apparent distress Lungs are clear Abdomen is soft nontender nondistended. His trocar incisions are well healed. GU-both testicles are descended, no scrotal masses, there's a small hematoma in the right groin.  Pathology: n/a  Assessment and Plan: 77 year old male status post laparoscopic repair of right direct inguinal hernia with mesh with postoperative urinary retention which is resolved. He also has a small hematoma in his right groin. I explained to them that I think this will resolve with time although take several weeks. I advised him he can gradually resume full activities. I showed him his intraoperative photos from his surgery. We will see him back in 6 weeks' time.

## 2011-02-24 NOTE — Patient Instructions (Signed)
Can  Gradually resume full activity

## 2011-02-24 NOTE — Op Note (Addendum)
Kevin Rubio, Kevin Rubio                 ACCOUNT NO.:  [id number]  MEDICAL RECORD NO.:  [id number]  LOCATION:  [facility]                         FACILITY:  [facility]  PHYSICIAN:  Mary Sella. Andrey Campanile, MD     DATE OF BIRTH:  [birth year]  DATE OF PROCEDURE:  01/27/2011 DATE OF DISCHARGE:                              OPERATIVE REPORT   PREOPERATIVE DIAGNOSIS:  Right inguinal hernia.  POSTOPERATIVE DIAGNOSIS:  Right direct inguinal hernia.  PROCEDURE:  Laparoscopic repair of right direct inguinal hernia with mesh (TAPP).  SURGEON:  Mary Sella. Andrey Campanile, MD  ASSISTANT SURGEON:  None.  ANESTHESIA:  General plus 30 mL of Exparel.  ESTIMATED BLOOD LOSS:  Minimal.  FINDINGS:  The patient had only a right direct inguinal hernia.  There is no evidence of left-sided hernia.  A 4 inch x 6 inch piece of Physio mesh was used as the mesh.  There are less than 10 tacks used.  COMPLICATIONS:  None immediately apparent.  INDICATIONS FOR PROCEDURE:  The patient is a very pleasant 77 year old gentleman who has a 10-year plus history of right inguinal hernia which had been asymptomatic until about a month ago.  Any time that he is bending over and lifting up something a little bit heavy, it causes swelling and pain in his right groin.  He has not had any incarceration or strangulation symptoms.  He elected to proceed to the operating room for surgical repair after we discussed the risks and benefits which included but not limited to bleeding, infection, injury to surrounding structures, testicular loss, urinary retention, DVT occurrence, mesh complications, hernia recurrence, hematoma or seroma formation as well as chronic inguinal pain.  DESCRIPTION OF PROCEDURE:  The patient was identified in the holding area and confirmed the operative site being the right side and this place was marked.  He was then taken to the operating room and general endotracheal anesthesia was established.  He was in and out catheterized.   There was little bit of resistance in inserting the Foley and there was a little bit of blood in the urine and there were no clots.  The catheter was removed after we drained his bladder.  His abdomen was then prepped and draped with ChloraPrep.  He received antibiotics prior to skin incision.  A surgical time-out was performed. A 1-inch vertical infraumbilical incision was made with a #11 blade. The subcutaneous tissues was spread and the fascia was grasped Kocher and lifted anteriorly.  Next, the fascia was incised and the abdominal cavity was entered.  A purse-string suture was placed around the fascial edges using 8-0 Vicryl on an UR6 needle.  The Hasson trocar was placed under direct visualization.  Pneumoperitoneum was smoothly established up to a patient pressure of 15 mmHg.  The laparoscope was advanced and the abdominal cavity was surveilled.  There was no evidence of injury to surrounding structures.  The abdominal cavity was surveilled.  There was only evidence of a right direct inguinal hernia.  There was no left inguinal hernia.  The defect in the right was medial to the inferior epigastric vessels.  I placed two additional 5-mm trocars, one in the right midclavicular line slightly  above the level of the umbilicus and one on the left in the midclavicular line slightly above the level of the umbilicus.  This all was done under direct visualization.  I then palpated his right anterosuperior iliac spine and grabbed the peritoneum about 2 inches above that and then made a lazy S incision using Endo Shears with electrocautery starting out laterally and carrying medially to the median umbilical ligament.  I then gently dissected the peritoneal flap down away from the anterior abdominal wall.  Any loose tiny vessels were ligated with Endo Shears with electrocautery.  The pubic symphysis was identified.  The testicular vessels and vas deferens were identified along with the inferior  epigastric vessels.  There was very large direct defect.  I reduced the direct defect with traction and countertraction stripping away the peritoneal lining from it.  Once this was done, I then reconfirmed my anatomy being the vas deferens, testicular vessels, and inferior epigastric vessels.  I then created a large pocket posteriorly by lifting and separating the peritoneum flap off the pelvic floor.  A small rent was made in the peritoneum.  At this point, I created a large pocket.  I did obtain a piece of Ethicon 4 inch x 6 inch Physio mesh placed through the Hasson trocar into the right groin and unrolled it so that half of it was covering the direct defect and half of it was covering laterally to the inferior epigastric vessels.  The mesh was well seated and covered the defect.  However, prior to laying the mesh out completely, I did grab the peritoneum in the direct defect and tacked it with 2 Securestrap tacks to Cooper ligament to reduce the dead space.  The mesh was then laid against the abdominal wall as previously described.  I then secured the mesh medially to Cooper ligament and into the anterior abdominal wall with an Ethicon Securestrap tacker.  I placed one tack on each side of the inferior epigastric vessels and one tack out laterally.  We then reduced the intraabdominal pressure and an I reapproximated the peritoneal flap back up to the abdominal wall using 4 tacks.  The mesh was well covered. With respect to the small rent, I reapproximated the edges of the rent in the peritoneal flap with two 5-mm Ligaclips.  The mesh was well covered.  There were no gaps exposing the mesh.  I then placed some of the Exparel in the right inguinal area under direct laparoscopic visualization in the skin and subcutaneous tissue and deep dermis.  I then placed it around the 5-mm trocar on the left side.  I then removed the Hasson trocar, tied down the previously placed purse-string  suture, thus layering the fascial defect.  There was no air leak at the umbilicus.  I then injected local in the preperitoneal space at the umbilicus.  Pneumoperitoneum was released and the last trocar was removed.  All skin incisions were closed with 4-0 Monocryl in a subcuticular fashion followed by application of Dermabond.  Additional local was placed in the deep dermis and subcutaneous tissue, a total of 30 mL were used.  The patient was extubated and taken to the recovery room in stable addition.  There were no immediate complications.  The patient tolerated the procedure well.     Mary Sella. Andrey Campanile, MD     EMW/MEDQ  D:  01/27/2011  T:  01/28/2011  Job:  [id number]  Electronically Signed by Gaynelle Adu M.D. on 02/24/2011 11:91:47 PM

## 2011-03-23 ENCOUNTER — Ambulatory Visit (INDEPENDENT_AMBULATORY_CARE_PROVIDER_SITE_OTHER): Payer: Medicare Other | Admitting: Internal Medicine

## 2011-03-23 DIAGNOSIS — D510 Vitamin B12 deficiency anemia due to intrinsic factor deficiency: Secondary | ICD-10-CM

## 2011-03-23 MED ORDER — CYANOCOBALAMIN 1000 MCG/ML IJ SOLN
1000.0000 ug | Freq: Once | INTRAMUSCULAR | Status: AC
  Administered 2011-03-23: 1000 ug via INTRAMUSCULAR

## 2011-04-13 ENCOUNTER — Encounter (INDEPENDENT_AMBULATORY_CARE_PROVIDER_SITE_OTHER): Payer: Self-pay | Admitting: General Surgery

## 2011-04-13 ENCOUNTER — Ambulatory Visit (INDEPENDENT_AMBULATORY_CARE_PROVIDER_SITE_OTHER): Payer: Medicare Other | Admitting: General Surgery

## 2011-04-13 VITALS — BP 112/72 | HR 72

## 2011-04-13 DIAGNOSIS — Z8719 Personal history of other diseases of the digestive system: Secondary | ICD-10-CM

## 2011-04-13 DIAGNOSIS — Z9889 Other specified postprocedural states: Secondary | ICD-10-CM

## 2011-04-13 DIAGNOSIS — Z09 Encounter for follow-up examination after completed treatment for conditions other than malignant neoplasm: Secondary | ICD-10-CM

## 2011-04-13 NOTE — Progress Notes (Signed)
Procedure: Laparoscopic repair of right direct inguinal hernia with mesh January 27, 2011  CC: postop  HPI: 77 year old male comes in today for his second postoperative visit. I last saw him several weeks ago. He has no complaints today. He denies any fevers or chills. He denies any inguinal discomfort. He denies any numbness or tingling in his groin. He denies any diarrhea or constipation. He denies any abdominal pain. He is still taking Flomax and reports no problems with urination. He is scheduled to see his primary care physician next week for an annual physical.  PE: BP 112/72  Pulse 72 Well-developed well-nourished elderly appearing Caucasian gentleman in no apparent distress Pulmonary-lungs are clear to auscultation Cardiac-regular rate and rhythm Abdomen-soft, nontender, nondistended. Well-healed trocar incisions GU-both testicles are descended. No scrotal masses. No signs of hernia recurrence. No hematoma  Data reviewed: I reviewed my office note from February 24, 2011.  Assessment and plan: Status post laparoscopic repair of right direct inguinal hernia with mesh with a resolved postoperative hematoma and urinary retention which is also resolved.  I think is doing quite well. There are no signs of hernia recurrence. I released him to full activity.  I'll see him on as-needed basis.

## 2011-04-19 ENCOUNTER — Ambulatory Visit (INDEPENDENT_AMBULATORY_CARE_PROVIDER_SITE_OTHER): Payer: Medicare Other | Admitting: Internal Medicine

## 2011-04-19 ENCOUNTER — Encounter: Payer: Self-pay | Admitting: Internal Medicine

## 2011-04-19 VITALS — BP 120/70 | HR 80 | Temp 97.6°F | Resp 16 | Ht 69.0 in | Wt 166.0 lb

## 2011-04-19 DIAGNOSIS — Z0000 Encounter for general adult medical examination without abnormal findings: Secondary | ICD-10-CM

## 2011-04-19 DIAGNOSIS — I252 Old myocardial infarction: Secondary | ICD-10-CM

## 2011-04-19 DIAGNOSIS — I4891 Unspecified atrial fibrillation: Secondary | ICD-10-CM

## 2011-04-19 DIAGNOSIS — I10 Essential (primary) hypertension: Secondary | ICD-10-CM

## 2011-04-19 DIAGNOSIS — I2510 Atherosclerotic heart disease of native coronary artery without angina pectoris: Secondary | ICD-10-CM

## 2011-04-19 DIAGNOSIS — M069 Rheumatoid arthritis, unspecified: Secondary | ICD-10-CM

## 2011-04-19 DIAGNOSIS — Z23 Encounter for immunization: Secondary | ICD-10-CM

## 2011-04-19 NOTE — Patient Instructions (Addendum)
It is important that you exercise regularly, at least 20 minutes 3 to 4 times per week.  If you develop chest pain or shortness of breath seek  medical attention.  Limit your sodium (Salt) intake  Monthly prothrombin time checks  Return in 6 months for follow-up  Return in 6 months for follow-up

## 2011-04-19 NOTE — Progress Notes (Signed)
Subjective:    Patient ID: KOLTEN RYBACK, male    DOB: [birth year], 77 y.o.   MRN: [id number]  HPI  77 year old patient who is seen today for a wellness exam. Earlier this year he underwent uneventful hernia repair. He does have a history of stable neutropenia and thrombocytopenia. There were bleeding problems associated with surgery that included gross hematuria. He required a Foley catheter for several days postop. He has coronary artery disease and atrial fibrillation and is followed by cardiology he has BPH and is now on Flomax. He has treated dyslipidemia. He remains on Coumadin anticoagulation. No new concerns or complaints. He saw Dr. Arlyn Leak yesterday his cardiologist and has had recent general surgical followup. He is doing well.  1. Risk factors, based on past  M,S,F history-  patient has known coronary artery disease as well as dyslipidemia he has chronic atrial fibrillation  2.  Physical activities: Remains fairly active in spite of his age and rheumatoid arthritis  3.  Depression/mood: No history of depression or mood disorder  4.  Hearing: Moderately severe impairment. Uses hearing aids  5.  ADL's: Independent in all aspects of daily living  6.  Fall risk: Moderate due to age and arthritis  7.  Home safety: No problems identified  8.  Height weight, and visual acuity;  9.  Counseling: Continue monthly INRs active lifestyle and restricted sodium diet  10. Lab orders based on risk factors: Has had a recent INR. Has had recent lab data to his inguinal hernia repair 11. Referral : Followup cardiology  12. Care plan: Continue present medical regimen 13. Cognitive assessment: Alert and oriented normal affect. No cognitive dysfunction    Past Medical History  Diagnosis Date  . ANEMIA, PERNICIOUS 05/14/2007  . Atrial fibrillation 08/08/2007  . BENIGN PROSTATIC HYPERTROPHY 03/05/2007  . CORONARY ARTERY DISEASE 03/05/2007  . DYSPLASTIC NEVUS 01/24/2008  . HYPERLIPIDEMIA 03/05/2007    . HYPERTENSION 03/05/2007  . MYOCARDIAL INFARCTION, HX OF 03/05/2007    1982  . NEUTROPENIA NOS 05/11/2007  . Rheumatoid arthritis 03/05/2007  . VITAMIN B12 DEFICIENCY 08/08/2007  . Current use of long term anticoagulation   . Rheumatoid arthritis   . Hernia     umbilical   Past Surgical History  Procedure Date  . Laparoscopic cholecystectomy 11/09/2001    Dr Kendrick Ranch  . Nasal polyp surgery   . Ptca 15 yrs ago  . External ear surgery 2 years ago  . Laparoscopic inguinal hernia repair 01/27/11    right direct, Dr Gaynelle Adu  . Hernia repair 2012    reports that he quit smoking about 32 years ago. He does not have any smokeless tobacco history on file. He reports that he does not drink alcohol or use illicit drugs. family history is not on file. Allergies  Allergen Reactions  . Cefpodoxime Proxetil        Review of Systems  Constitutional: Negative for fever, chills, activity change, appetite change and fatigue.  HENT: Negative for hearing loss, ear pain, congestion, rhinorrhea, sneezing, mouth sores, trouble swallowing, neck pain, neck stiffness, dental problem, voice change, sinus pressure and tinnitus.   Eyes: Negative for photophobia, pain, redness and visual disturbance.  Respiratory: Negative for apnea, cough, choking, chest tightness, shortness of breath and wheezing.   Cardiovascular: Negative for chest pain, palpitations and leg swelling.  Gastrointestinal: Negative for nausea, vomiting, abdominal pain, diarrhea, constipation, blood in stool, abdominal distention, anal bleeding and rectal pain.  Genitourinary: Negative for dysuria, urgency, frequency,  hematuria, flank pain, decreased urine volume, discharge, penile swelling, scrotal swelling, difficulty urinating, genital sores and testicular pain.  Musculoskeletal: Negative for myalgias, back pain, joint swelling, arthralgias and gait problem.  Skin: Negative for color change, rash and wound.  Neurological: Negative for  dizziness, tremors, seizures, syncope, facial asymmetry, speech difficulty, weakness, light-headedness, numbness and headaches.  Hematological: Negative for adenopathy. Does not bruise/bleed easily.  Psychiatric/Behavioral: Negative for suicidal ideas, hallucinations, behavioral problems, confusion, sleep disturbance, self-injury, dysphoric mood, decreased concentration and agitation. The patient is not nervous/anxious.        Objective:   Physical Exam  Constitutional: He appears well-developed and well-nourished.  HENT:  Head: Normocephalic and atraumatic.  Right Ear: External ear normal.  Left Ear: External ear normal.  Nose: Nose normal.  Mouth/Throat: Oropharynx is clear and moist.  Eyes: Conjunctivae and EOM are normal. Pupils are equal, round, and reactive to light. No scleral icterus.  Neck: Normal range of motion. Neck supple. No JVD present. No thyromegaly present.  Cardiovascular: Regular rhythm, normal heart sounds and intact distal pulses.  Exam reveals no gallop and no friction rub.   No murmur heard.      Dorsalis pedis pulses were intact posterior tibial pulses were faint  Pulmonary/Chest: Effort normal and breath sounds normal. He exhibits no tenderness.  Abdominal: Soft. Bowel sounds are normal. He exhibits no distension and no mass. There is no tenderness.  Genitourinary: Penis normal. Guaiac negative stool.       Prostate +3 enlarged smooth and symmetrical  Musculoskeletal: Normal range of motion. He exhibits no edema and no tenderness.       Joint deformities of RA  Lymphadenopathy:    He has no cervical adenopathy.  Neurological: He is alert. He has normal reflexes. No cranial nerve deficit. Coordination normal.  Skin: Skin is warm and dry. No rash noted.  Psychiatric: He has a normal mood and affect. His behavior is normal.          Assessment & Plan:    Preventive health Coronary artery disease Atrial fibrillation with chronic Coumadin  anticoagulation BPH Status post right inguinal hernia repair  Recheck 6 months

## 2011-04-22 ENCOUNTER — Ambulatory Visit (INDEPENDENT_AMBULATORY_CARE_PROVIDER_SITE_OTHER): Payer: Medicare Other | Admitting: Internal Medicine

## 2011-04-22 ENCOUNTER — Encounter: Payer: Self-pay | Admitting: Internal Medicine

## 2011-04-22 DIAGNOSIS — I10 Essential (primary) hypertension: Secondary | ICD-10-CM

## 2011-04-22 DIAGNOSIS — L309 Dermatitis, unspecified: Secondary | ICD-10-CM

## 2011-04-22 DIAGNOSIS — L259 Unspecified contact dermatitis, unspecified cause: Secondary | ICD-10-CM

## 2011-04-22 MED ORDER — METHYLPREDNISOLONE ACETATE 40 MG/ML IJ SUSP
40.0000 mg | Freq: Once | INTRAMUSCULAR | Status: AC
  Administered 2011-04-22: 40 mg via INTRAMUSCULAR

## 2011-04-22 NOTE — Patient Instructions (Signed)
Call or return to clinic prn if these symptoms worsen or fail to improve as anticipated.

## 2011-04-22 NOTE — Progress Notes (Signed)
Addended by: Duard Brady I on: 04/22/2011 01:21 PM   Modules accepted: Orders

## 2011-04-22 NOTE — Progress Notes (Signed)
  Subjective:    Patient ID: Kevin Rubio, male    DOB: 1922/02/14, 77 y.o.   MRN: [id number]  HPI  77 year old patient who was seen here 3 days ago for his annual physical and given a tetanus toxoid vaccine at that time. The following day he noticed some redness involving his left upper inner arm. This has been unchanged since that time there's been no pain fever or itching. Yesterday he noted a small nodule involving his left inner calf region. This has also been asymptomatic. He was concerned about possible allergic reaction to the tetanus toxoid    Review of Systems  Constitutional: Negative for fever, chills, appetite change and fatigue.  HENT: Negative for hearing loss, ear pain, congestion, sore throat, trouble swallowing, neck stiffness, dental problem, voice change and tinnitus.   Eyes: Negative for pain, discharge and visual disturbance.  Respiratory: Negative for cough, chest tightness, wheezing and stridor.   Cardiovascular: Negative for chest pain, palpitations and leg swelling.  Gastrointestinal: Negative for nausea, vomiting, abdominal pain, diarrhea, constipation, blood in stool and abdominal distention.  Genitourinary: Negative for urgency, hematuria, flank pain, discharge, difficulty urinating and genital sores.  Musculoskeletal: Negative for myalgias, back pain, joint swelling, arthralgias and gait problem.  Skin: Positive for rash.  Neurological: Negative for dizziness, syncope, speech difficulty, weakness, numbness and headaches.  Hematological: Negative for adenopathy. Does not bruise/bleed easily.  Psychiatric/Behavioral: Negative for behavioral problems and dysphoric mood. The patient is not nervous/anxious.        Objective:   Physical Exam  Constitutional: He appears well-developed and well-nourished. No distress.  Skin: Rash noted.       Patient had a homogeneous erythematous patch involving his left upper inner arm. This was nonpruritic and blanched with  pressure was nontender  He also had a 2 cm nonpainful nodule involving his left inner calf there is no erythema or tenderness          Assessment & Plan:   Nonspecific dermatitis. Unclear if this is related to tetanus toxoid. He is largely asymptomatic. Will treat with Depo-Medrol 40 mg IM and clinically observed.

## 2011-04-26 ENCOUNTER — Ambulatory Visit (INDEPENDENT_AMBULATORY_CARE_PROVIDER_SITE_OTHER): Payer: Medicare Other | Admitting: Internal Medicine

## 2011-04-26 ENCOUNTER — Encounter: Payer: Self-pay | Admitting: Internal Medicine

## 2011-04-26 ENCOUNTER — Other Ambulatory Visit: Payer: Self-pay

## 2011-04-26 DIAGNOSIS — L03116 Cellulitis of left lower limb: Secondary | ICD-10-CM

## 2011-04-26 DIAGNOSIS — I10 Essential (primary) hypertension: Secondary | ICD-10-CM

## 2011-04-26 DIAGNOSIS — L03119 Cellulitis of unspecified part of limb: Secondary | ICD-10-CM

## 2011-04-26 DIAGNOSIS — I2510 Atherosclerotic heart disease of native coronary artery without angina pectoris: Secondary | ICD-10-CM

## 2011-04-26 DIAGNOSIS — D510 Vitamin B12 deficiency anemia due to intrinsic factor deficiency: Secondary | ICD-10-CM

## 2011-04-26 MED ORDER — CYANOCOBALAMIN 1000 MCG/ML IJ SOLN
1000.0000 ug | Freq: Once | INTRAMUSCULAR | Status: AC
  Administered 2011-04-26: 1000 ug via INTRAMUSCULAR

## 2011-04-26 MED ORDER — AMOXICILLIN-POT CLAVULANATE 875-125 MG PO TABS: 1.0000 | ORAL_TABLET | Freq: Two times a day (BID) | ORAL | Status: AC

## 2011-04-26 MED ORDER — AMOXICILLIN-POT CLAVULANATE 875-125 MG PO TABS: 1.0000 | ORAL_TABLET | Freq: Two times a day (BID) | ORAL | Status: DC

## 2011-04-26 NOTE — Patient Instructions (Signed)
Take your antibiotic as prescribed until ALL of it is gone, but stop if you develop a rash, swelling, or any side effects of the medication.  Contact our office as soon as possible if  there are side effects of the medication.  Keep left leg elevated as much as possible  Call if you  develops worsening fever redness or pain

## 2011-04-26 NOTE — Progress Notes (Signed)
  Subjective:    Patient ID: Kevin Rubio, male    DOB: 1922-05-25, 77 y.o.   MRN: [id number]  HPI  77 year old patient who was seen last week with a nonspecific dermatitis involving his left upper inner arm and left medial calf area. This began after a tetanus toxoid he had earlier. The left arm area has nearly completely resolved. He has developed increasing redness excessive warmth and tenderness involving his left medial calf. He is on Coumadin anticoagulation. He also has noted a nodule involving his left upper thigh    Review of Systems  Constitutional: Negative for fever, chills, appetite change and fatigue.  HENT: Negative for hearing loss, ear pain, congestion, sore throat, trouble swallowing, neck stiffness, dental problem, voice change and tinnitus.   Eyes: Negative for pain, discharge and visual disturbance.  Respiratory: Negative for cough, chest tightness, wheezing and stridor.   Cardiovascular: Negative for chest pain, palpitations and leg swelling.  Gastrointestinal: Negative for nausea, vomiting, abdominal pain, diarrhea, constipation, blood in stool and abdominal distention.  Genitourinary: Negative for urgency, hematuria, flank pain, discharge, difficulty urinating and genital sores.  Musculoskeletal: Negative for myalgias, back pain, joint swelling, arthralgias and gait problem.  Skin: Positive for rash.  Neurological: Negative for dizziness, syncope, speech difficulty, weakness, numbness and headaches.  Hematological: Negative for adenopathy. Does not bruise/bleed easily.  Psychiatric/Behavioral: Negative for behavioral problems and dysphoric mood. The patient is not nervous/anxious.        Objective:   Physical Exam  Constitutional: He appears well-developed and well-nourished. No distress.  Skin:       The left upper inner arm revealed some very mild erythema and essentially unremarkable His left medial calf was very erythematous slightly tender and warm to touch.  He had an approximate 8 cm area consistent with some early cellulitis The lesion on his left medial thigh appeared to be a hematoma          Assessment & Plan:   Cellulitis left leg. Will treated with Augmentin for 7 days

## 2011-04-26 NOTE — Telephone Encounter (Signed)
efile error - re-sent

## 2011-05-03 ENCOUNTER — Other Ambulatory Visit: Payer: Self-pay | Admitting: Internal Medicine

## 2011-05-03 NOTE — Telephone Encounter (Signed)
augmentin refill request - please advise

## 2011-05-03 NOTE — Telephone Encounter (Signed)
done

## 2011-05-03 NOTE — Telephone Encounter (Signed)
OK and I will  #14

## 2011-05-17 LAB — COMPREHENSIVE METABOLIC PANEL
Albumin: 2.8 — ABNORMAL LOW
Alkaline Phosphatase: 70
BUN: 20
Calcium: 8.7
Creatinine, Ser: 1.73 — ABNORMAL HIGH
Glucose, Bld: 130 — ABNORMAL HIGH
Total Protein: 7.5

## 2011-05-17 LAB — URINE MICROSCOPIC-ADD ON

## 2011-05-17 LAB — PROTIME-INR
INR: 3.2 — ABNORMAL HIGH
INR: 3.2 — ABNORMAL HIGH
INR: 5.8
Prothrombin Time: 54.7 — ABNORMAL HIGH

## 2011-05-17 LAB — DIFFERENTIAL
Basophils Relative: 0
Lymphocytes Relative: 9 — ABNORMAL LOW
Lymphs Abs: 0.7
Monocytes Absolute: 0.6
Monocytes Relative: 8
Neutro Abs: 6.2
Neutrophils Relative %: 83 — ABNORMAL HIGH

## 2011-05-17 LAB — APTT: aPTT: 55 — ABNORMAL HIGH

## 2011-05-17 LAB — CBC
HCT: 31 — ABNORMAL LOW
HCT: 32.3 — ABNORMAL LOW
HCT: 33.1 — ABNORMAL LOW
HCT: 42
Hemoglobin: 10.7 — ABNORMAL LOW
Hemoglobin: 11.3 — ABNORMAL LOW
Hemoglobin: 13.6
MCHC: 32.4
MCHC: 34.1
MCV: 80.1
MCV: 80.1
Platelets: 100 — ABNORMAL LOW
Platelets: 137 — ABNORMAL LOW
RBC: 4.03 — ABNORMAL LOW
RBC: 4.18 — ABNORMAL LOW
RDW: 16.8 — ABNORMAL HIGH
RDW: 17 — ABNORMAL HIGH
WBC: 4.2
WBC: 5.2

## 2011-05-17 LAB — URINE CULTURE: Colony Count: >=100000

## 2011-05-17 LAB — BASIC METABOLIC PANEL
BUN: 12
CO2: 26
Chloride: 109
Chloride: 110
GFR calc Af Amer: >60
GFR calc Af Amer: >60
GFR calc non Af Amer: >60
Glucose, Bld: 91
Potassium: 4.2
Potassium: 4.2
Sodium: 138
Sodium: 141

## 2011-05-17 LAB — URINALYSIS, ROUTINE W REFLEX MICROSCOPIC
Ketones, ur: 15 — AB
Nitrite: NEGATIVE
Urobilinogen, UA: 1

## 2011-05-17 LAB — CULTURE, BLOOD (ROUTINE X 2): Culture: NO GROWTH

## 2011-05-23 ENCOUNTER — Ambulatory Visit (INDEPENDENT_AMBULATORY_CARE_PROVIDER_SITE_OTHER): Payer: Medicare Other | Admitting: Internal Medicine

## 2011-05-23 DIAGNOSIS — D510 Vitamin B12 deficiency anemia due to intrinsic factor deficiency: Secondary | ICD-10-CM

## 2011-05-23 MED ORDER — CYANOCOBALAMIN 1000 MCG/ML IJ SOLN
1000.0000 ug | Freq: Once | INTRAMUSCULAR | Status: AC
  Administered 2011-05-23: 1000 ug via INTRAMUSCULAR

## 2011-06-02 LAB — COMPREHENSIVE METABOLIC PANEL
Albumin: 3.4 — ABNORMAL LOW
Alkaline Phosphatase: 70
BUN: 16
CO2: 22
Chloride: 105
Creatinine, Ser: 0.93
GFR calc non Af Amer: >60
Glucose, Bld: 145 — ABNORMAL HIGH
Potassium: 4.6
Total Bilirubin: 0.9

## 2011-06-02 LAB — CBC
HCT: 40.2
HCT: 42.7
Hemoglobin: 13.5
Hemoglobin: 14.3
MCHC: 33.4
MCHC: 33.5
MCV: 79.7
MCV: 80.1
Platelets: 101 — ABNORMAL LOW
Platelets: 97 — ABNORMAL LOW
RBC: 5.04
RDW: 14.9 — ABNORMAL HIGH
RDW: 15.3 — ABNORMAL HIGH
WBC: 2.5 — ABNORMAL LOW

## 2011-06-02 LAB — BASIC METABOLIC PANEL WITH GFR
BUN: 15
Chloride: 103
Potassium: 4.4

## 2011-06-02 LAB — DIFFERENTIAL
Basophils Absolute: 0
Basophils Relative: 0
Basophils Relative: 0
Eosinophils Absolute: 0.2
Eosinophils Relative: 8 — ABNORMAL HIGH
Lymphocytes Relative: 33
Lymphocytes Relative: 33
Lymphs Abs: 0.8
Monocytes Absolute: 0.4
Monocytes Absolute: 0.4
Monocytes Relative: 14 — ABNORMAL HIGH
Monocytes Relative: 17 — ABNORMAL HIGH
Neutro Abs: 1.1 — ABNORMAL LOW
Neutro Abs: 1.5 — ABNORMAL LOW
Neutrophils Relative %: 43
Neutrophils Relative %: 50

## 2011-06-02 LAB — BASIC METABOLIC PANEL
CO2: 26
Calcium: 8.6
Creatinine, Ser: 0.96
GFR calc Af Amer: >60
GFR calc non Af Amer: >60
Glucose, Bld: 116 — ABNORMAL HIGH
Sodium: 134 — ABNORMAL LOW

## 2011-06-02 LAB — PROTIME-INR
INR: 1.2
Prothrombin Time: 15.3 — ABNORMAL HIGH
Prothrombin Time: 18.6 — ABNORMAL HIGH

## 2011-06-02 LAB — CARDIAC PANEL(CRET KIN+CKTOT+MB+TROPI)
CK, MB: 0.9
Total CK: 77
Total CK: 78
Troponin I: 0.04
Troponin I: 0.05

## 2011-06-02 LAB — SAMPLE TO BLOOD BANK

## 2011-06-02 LAB — TSH: TSH: 2.58

## 2011-06-02 LAB — POCT CARDIAC MARKERS
CKMB, poc: 1.7
Myoglobin, poc: 117
Operator id: 284141
Troponin i, poc: <0.05

## 2011-06-02 LAB — CK TOTAL AND CKMB (NOT AT ARMC): CK, MB: 0.9

## 2011-06-02 LAB — B-NATRIURETIC PEPTIDE (CONVERTED LAB): Pro B Natriuretic peptide (BNP): 135 — ABNORMAL HIGH

## 2011-06-02 LAB — LIPID PANEL
LDL Cholesterol: 63
Triglycerides: 153 — ABNORMAL HIGH
VLDL: 31

## 2011-06-02 LAB — TROPONIN I: Troponin I: 0.06

## 2011-06-09 ENCOUNTER — Encounter (HOSPITAL_BASED_OUTPATIENT_CLINIC_OR_DEPARTMENT_OTHER): Payer: Medicare Other | Admitting: Oncology

## 2011-06-09 ENCOUNTER — Other Ambulatory Visit: Payer: Self-pay | Admitting: Medical

## 2011-06-09 DIAGNOSIS — D709 Neutropenia, unspecified: Secondary | ICD-10-CM

## 2011-06-09 DIAGNOSIS — D696 Thrombocytopenia, unspecified: Secondary | ICD-10-CM

## 2011-06-09 DIAGNOSIS — Z85828 Personal history of other malignant neoplasm of skin: Secondary | ICD-10-CM

## 2011-06-09 DIAGNOSIS — M069 Rheumatoid arthritis, unspecified: Secondary | ICD-10-CM

## 2011-06-09 LAB — COMPREHENSIVE METABOLIC PANEL
BUN: 14 mg/dL (ref 6–23)
CO2: 24 mEq/L (ref 19–32)
Creatinine, Ser: 1 mg/dL (ref 0.50–1.35)
Glucose, Bld: 160 mg/dL — ABNORMAL HIGH (ref 70–99)
Total Bilirubin: 0.8 mg/dL (ref 0.3–1.2)
Total Protein: 7.4 g/dL (ref 6.0–8.3)

## 2011-06-09 LAB — CBC WITH DIFFERENTIAL/PLATELET
Eosinophils Absolute: 0.1 10*3/uL (ref 0.0–0.5)
HCT: 48.6 % (ref 38.4–49.9)
LYMPH%: 35.2 % (ref 14.0–49.0)
MCV: 94.6 fL (ref 79.3–98.0)
MONO#: 0.3 10*3/uL (ref 0.1–0.9)
MONO%: 16.4 % — ABNORMAL HIGH (ref 0.0–14.0)
NEUT#: 0.9 10*3/uL — ABNORMAL LOW (ref 1.5–6.5)
NEUT%: 43.9 % (ref 39.0–75.0)
Platelets: 68 10*3/uL — ABNORMAL LOW (ref 140–400)
WBC: 2.1 10*3/uL — ABNORMAL LOW (ref 4.0–10.3)

## 2011-06-23 ENCOUNTER — Ambulatory Visit (INDEPENDENT_AMBULATORY_CARE_PROVIDER_SITE_OTHER): Payer: Medicare Other

## 2011-06-23 DIAGNOSIS — Z23 Encounter for immunization: Secondary | ICD-10-CM

## 2011-06-23 DIAGNOSIS — D510 Vitamin B12 deficiency anemia due to intrinsic factor deficiency: Secondary | ICD-10-CM

## 2011-06-23 DIAGNOSIS — Z0000 Encounter for general adult medical examination without abnormal findings: Secondary | ICD-10-CM

## 2011-06-23 MED ORDER — CYANOCOBALAMIN 1000 MCG/ML IJ SOLN
1000.0000 ug | Freq: Once | INTRAMUSCULAR | Status: AC
  Administered 2011-06-23: 1000 ug via INTRAMUSCULAR

## 2011-07-25 ENCOUNTER — Ambulatory Visit (INDEPENDENT_AMBULATORY_CARE_PROVIDER_SITE_OTHER): Payer: Medicare Other | Admitting: Internal Medicine

## 2011-07-25 DIAGNOSIS — D519 Vitamin B12 deficiency anemia, unspecified: Secondary | ICD-10-CM

## 2011-07-25 DIAGNOSIS — D518 Other vitamin B12 deficiency anemias: Secondary | ICD-10-CM

## 2011-07-25 MED ORDER — CYANOCOBALAMIN 1000 MCG/ML IJ SOLN
1000.0000 ug | Freq: Once | INTRAMUSCULAR | Status: AC
  Administered 2011-07-25: 1000 ug via INTRAMUSCULAR

## 2011-09-12 ENCOUNTER — Other Ambulatory Visit: Payer: Self-pay | Admitting: Cardiology

## 2011-09-19 ENCOUNTER — Other Ambulatory Visit: Payer: Self-pay | Admitting: Cardiology

## 2011-09-23 ENCOUNTER — Ambulatory Visit (INDEPENDENT_AMBULATORY_CARE_PROVIDER_SITE_OTHER): Payer: Medicare Other | Admitting: Internal Medicine

## 2011-09-23 DIAGNOSIS — E538 Deficiency of other specified B group vitamins: Secondary | ICD-10-CM

## 2011-09-23 MED ORDER — CYANOCOBALAMIN 1000 MCG/ML IJ SOLN
1000.0000 ug | Freq: Once | INTRAMUSCULAR | Status: AC
  Administered 2011-09-23: 1000 ug via INTRAMUSCULAR

## 2011-10-18 ENCOUNTER — Ambulatory Visit (INDEPENDENT_AMBULATORY_CARE_PROVIDER_SITE_OTHER): Payer: Medicare Other | Admitting: Internal Medicine

## 2011-10-18 ENCOUNTER — Encounter: Payer: Self-pay | Admitting: Internal Medicine

## 2011-10-18 DIAGNOSIS — I2510 Atherosclerotic heart disease of native coronary artery without angina pectoris: Secondary | ICD-10-CM

## 2011-10-18 DIAGNOSIS — M069 Rheumatoid arthritis, unspecified: Secondary | ICD-10-CM

## 2011-10-18 DIAGNOSIS — E785 Hyperlipidemia, unspecified: Secondary | ICD-10-CM

## 2011-10-18 DIAGNOSIS — I10 Essential (primary) hypertension: Secondary | ICD-10-CM

## 2011-10-18 DIAGNOSIS — I4891 Unspecified atrial fibrillation: Secondary | ICD-10-CM

## 2011-10-18 DIAGNOSIS — N400 Enlarged prostate without lower urinary tract symptoms: Secondary | ICD-10-CM

## 2011-10-18 MED ORDER — PRAVASTATIN SODIUM 40 MG PO TABS: 40.0000 mg | ORAL_TABLET | Freq: Every day | ORAL | Status: DC

## 2011-10-18 NOTE — Progress Notes (Signed)
  Subjective:    Patient ID: Kevin Rubio, male    DOB: 1922/07/23, 78 y.o.   MRN: [id number]  HPI  78 year old patient who is in today for followup. He is followed by cardiology and also annually at the [facility]. He states that he had laboratory studies done in November of late last year. He is doing quite well today and has also had a recent cardiology evaluation. He has a history of dyslipidemia and has been on niacin for quite some time this was recently stopped by cardiology due to due some significant side effects. His cardiac status has been sick he has coronary artery disease and atrial fibrillation. Remains on chronic Coumadin anticoagulation and has done quite well. Denies any exertional chest pain He has treated hypertension and He also has a history of BPH which has been well controlled on Flomax. He has no concerns or complaints today He has a history of rheumatoid arthritis which has been quiescent.    Review of Systems  Constitutional: Negative for fever, chills, appetite change and fatigue.  HENT: Negative for hearing loss, ear pain, congestion, sore throat, trouble swallowing, neck stiffness, dental problem, voice change and tinnitus.   Eyes: Negative for pain, discharge and visual disturbance.  Respiratory: Negative for cough, chest tightness, wheezing and stridor.   Cardiovascular: Negative for chest pain, palpitations and leg swelling.  Gastrointestinal: Negative for nausea, vomiting, abdominal pain, diarrhea, constipation, blood in stool and abdominal distention.  Genitourinary: Negative for urgency, hematuria, flank pain, discharge, difficulty urinating and genital sores.  Musculoskeletal: Positive for arthralgias. Negative for myalgias, back pain, joint swelling and gait problem.  Skin: Negative for rash.  Neurological: Negative for dizziness, syncope, speech difficulty, weakness, numbness and headaches.  Hematological: Negative for adenopathy. Does not  bruise/bleed easily.  Psychiatric/Behavioral: Negative for behavioral problems and dysphoric mood. The patient is not nervous/anxious.        Objective:   Physical Exam  Constitutional: He is oriented to person, place, and time. He appears well-developed.       Blood pressure 120/70  HENT:  Head: Normocephalic.  Right Ear: External ear normal.  Left Ear: External ear normal.  Eyes: Conjunctivae and EOM are normal.  Neck: Normal range of motion.  Cardiovascular: Normal rate and normal heart sounds.        Posterior tibial pulses are faintly palpable  Pulmonary/Chest: Breath sounds normal.  Abdominal: Bowel sounds are normal.  Musculoskeletal: Normal range of motion. He exhibits no edema and no tenderness.  Neurological: He is alert and oriented to person, place, and time.  Psychiatric: He has a normal mood and affect. His behavior is normal.          Assessment & Plan:   Hypertension well controlled Coronary artery disease stable Chronic Coumadin anticoagulation Dyslipidemia. Will continue pravastatin medication refilled niacin has been discontinued  Recheck 6 months

## 2011-10-18 NOTE — Patient Instructions (Signed)
Limit your sodium (Salt) intake    It is important that you exercise regularly, at least 20 minutes 3 to 4 times per week.  If you develop chest pain or shortness of breath seek  medical attention.  Return in 6 months for follow-up  

## 2011-10-21 ENCOUNTER — Ambulatory Visit (INDEPENDENT_AMBULATORY_CARE_PROVIDER_SITE_OTHER): Payer: Medicare Other | Admitting: Internal Medicine

## 2011-10-21 DIAGNOSIS — D510 Vitamin B12 deficiency anemia due to intrinsic factor deficiency: Secondary | ICD-10-CM

## 2011-10-21 MED ORDER — CYANOCOBALAMIN 1000 MCG/ML IJ SOLN
1000.0000 ug | Freq: Once | INTRAMUSCULAR | Status: AC
  Administered 2011-10-21: 1000 ug via INTRAMUSCULAR

## 2011-11-23 ENCOUNTER — Ambulatory Visit (INDEPENDENT_AMBULATORY_CARE_PROVIDER_SITE_OTHER): Payer: Medicare Other | Admitting: Internal Medicine

## 2011-11-23 DIAGNOSIS — D510 Vitamin B12 deficiency anemia due to intrinsic factor deficiency: Secondary | ICD-10-CM

## 2011-11-23 MED ORDER — CYANOCOBALAMIN 1000 MCG/ML IJ SOLN
1000.0000 ug | Freq: Once | INTRAMUSCULAR | Status: AC
  Administered 2011-11-23: 1000 ug via INTRAMUSCULAR

## 2011-12-15 ENCOUNTER — Telehealth: Payer: Self-pay | Admitting: Oncology

## 2011-12-15 ENCOUNTER — Other Ambulatory Visit (HOSPITAL_BASED_OUTPATIENT_CLINIC_OR_DEPARTMENT_OTHER): Payer: Medicare Other | Admitting: Lab

## 2011-12-15 ENCOUNTER — Ambulatory Visit (HOSPITAL_BASED_OUTPATIENT_CLINIC_OR_DEPARTMENT_OTHER): Payer: Medicare Other | Admitting: Oncology

## 2011-12-15 DIAGNOSIS — M069 Rheumatoid arthritis, unspecified: Secondary | ICD-10-CM

## 2011-12-15 DIAGNOSIS — D696 Thrombocytopenia, unspecified: Secondary | ICD-10-CM

## 2011-12-15 DIAGNOSIS — D709 Neutropenia, unspecified: Secondary | ICD-10-CM

## 2011-12-15 LAB — CBC WITH DIFFERENTIAL/PLATELET
Basophils Absolute: 0 10*3/uL (ref 0.0–0.1)
Eosinophils Absolute: 0.1 10*3/uL (ref 0.0–0.5)
HCT: 46.6 % (ref 38.4–49.9)
HGB: 15.9 g/dL (ref 13.0–17.1)
LYMPH%: 35.8 % (ref 14.0–49.0)
MONO#: 0.2 10*3/uL (ref 0.1–0.9)
NEUT#: 0.7 10*3/uL — ABNORMAL LOW (ref 1.5–6.5)
Platelets: 59 10*3/uL — ABNORMAL LOW (ref 140–400)
RBC: 4.96 10*6/uL (ref 4.20–5.82)
WBC: 1.6 10*3/uL — ABNORMAL LOW (ref 4.0–10.3)
nRBC: 1 % — ABNORMAL HIGH (ref 0–0)

## 2011-12-15 LAB — COMPREHENSIVE METABOLIC PANEL
ALT: 12 U/L (ref 0–53)
AST: 24 U/L (ref 0–37)
Albumin: 3.9 g/dL (ref 3.5–5.2)
BUN: 17 mg/dL (ref 6–23)
Calcium: 8.6 mg/dL (ref 8.4–10.5)
Chloride: 104 mEq/L (ref 96–112)
Potassium: 3.8 mEq/L (ref 3.5–5.3)

## 2011-12-15 NOTE — Progress Notes (Signed)
Hematology and Oncology Follow Up Visit  MATTHE SLOANE [id number] [birth year] 78 y.o. 12/15/2011 9:11 AM  CC: Gordy Savers, MD  Georga Hacking, M.D.  Hermelinda Medicus, M.D.    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   Current therapy: Observation and surveillance. He is S/P bone marrow biopsy done in 06/2009 that was unrevealing.   Interim History:  Mr. Casasola presents today for a followup visit.  He has continued to really have no complications related to his neutropenia and thrombocytopenia.  He had not had any fevers, had not had any chills, had not had any opportunistic infection or hospitalization.  He had not had any epistaxis.  His performance status and activity level remain at reasonable range.  His arthritis has been under reasonable control. No major changes since last visit.   Medications: I have reviewed the patient's current medications. Current outpatient prescriptions:ascorbic Acid (VITAMIN C) 500 MG CPCR, Take 500 mg by mouth daily.  , Disp: , Rfl: ;  Ferrous Sulfate (IRON CR PO), Take 65 mg by mouth daily.  , Disp: , Rfl: ;  guaiFENesin (MUCINEX) 600 MG 12 hr tablet, Take 600 mg by mouth daily. , Disp: , Rfl: ;  metoprolol tartrate (LOPRESSOR) 25 MG tablet, 25 mg 3 (three) times daily. Two tablets in the morning, and one tablet in the afternoon , Disp: , Rfl:  Multiple Vitamins-Minerals (CENTRUM SILVER PO), Take by mouth daily.  , Disp: , Rfl: ;  Omega-3 Fatty Acids (FISH OIL) 1000 MG CAPS, Take 2 capsules by mouth daily.  , Disp: , Rfl: ;  pravastatin (PRAVACHOL) 40 MG tablet, Take 1 tablet (40 mg total) by mouth daily., Disp: 90 tablet, Rfl: 6;  Tamsulosin HCl (FLOMAX) 0.4 MG CAPS, Take 0.4 mg by mouth daily.  , Disp: , Rfl:  warfarin (COUMADIN) 5 MG tablet, Take 0.5 tablets (2.5 mg total) by mouth daily., Disp: 90 tablet, Rfl: 6  Allergies:    Allergies  Allergen Reactions  . Cefpodoxime Proxetil     Past Medical History, Surgical history, Social history, and Family History were reviewed and updated.  Review of Systems: Constitutional:  Negative for fever, chills, night sweats, anorexia, weight loss, pain. Cardiovascular: no chest pain or dyspnea on exertion Respiratory: negative Neurological: negative Dermatological: negative ENT: negative Skin: Negative. Gastrointestinal: negative Genito-Urinary: negative Hematological and Lymphatic: negative Breast: negative Musculoskeletal: negative Remaining ROS negative. Physical Exam: Blood pressure 131/71, pulse 61, temperature 97 F (36.1 C), temperature source Oral, weight 162 lb 8 oz (73.71 kg). ECOG: 1 General appearance: alert Head: Normocephalic, without obvious abnormality, atraumatic Neck: no adenopathy, no carotid bruit, no JVD, supple, symmetrical, trachea midline and thyroid not enlarged, symmetric, no tenderness/mass/nodules Lymph nodes: Cervical, supraclavicular, and axillary nodes normal. Heart:regular rate and rhythm, S1, S2 normal, no murmur, click, rub or gallop Lung:chest clear, no wheezing, rales, normal symmetric air entry Abdomin: soft, non-tender, without masses or organomegaly EXT:no erythema, induration, or nodules   Lab Results: Lab Results  Component Value Date   WBC 1.6* 12/15/2011   HGB 15.9 12/15/2011   HCT 46.6 12/15/2011   MCV 93.9 12/15/2011   PLT 59* 12/15/2011     Chemistry      Component Value Date/Time   NA 137 06/09/2011 0909   K 4.0 06/09/2011 0909   CL 102 06/09/2011 0909   CO2 24 06/09/2011 0909   BUN 14 06/09/2011 0909   CREATININE  1.00 06/09/2011 0909      Component Value Date/Time   CALCIUM 9.0 06/09/2011 0909   ALKPHOS 85 06/09/2011 0909   AST 29 06/09/2011 0909   ALT 19 06/09/2011 0909   BILITOT 0.8 06/09/2011 0909       Impression and Plan:  This is a pleasant 78 year old gentleman with the following  issues: 1. Thrombocytopenia and neutropenia, most likely autoimmune in nature.  He had not had any recurrent sinopulmonary infection, had not had any bleeding, so the plan is to continue with observation at this time.  Certainly, a trial of steroids would be needed if there is any evidence of any infection or bleeding.  We definitely could consider growth factor support such as Neupogen down the line.   2. Rheumatoid arthritis, seems to be stable.   [facility], MD 4/25/20139:11 AM

## 2011-12-15 NOTE — Telephone Encounter (Signed)
Gave pt calendar appt for October 2013 lab and MD

## 2011-12-21 ENCOUNTER — Ambulatory Visit (INDEPENDENT_AMBULATORY_CARE_PROVIDER_SITE_OTHER): Payer: Medicare Other | Admitting: Internal Medicine

## 2011-12-21 DIAGNOSIS — D510 Vitamin B12 deficiency anemia due to intrinsic factor deficiency: Secondary | ICD-10-CM

## 2011-12-21 DIAGNOSIS — E538 Deficiency of other specified B group vitamins: Secondary | ICD-10-CM

## 2011-12-21 MED ORDER — CYANOCOBALAMIN 1000 MCG/ML IJ SOLN
1000.0000 ug | Freq: Once | INTRAMUSCULAR | Status: AC
  Administered 2011-12-21: 1000 ug via INTRAMUSCULAR

## 2012-01-02 ENCOUNTER — Encounter: Payer: Self-pay | Admitting: Internal Medicine

## 2012-01-02 ENCOUNTER — Ambulatory Visit (INDEPENDENT_AMBULATORY_CARE_PROVIDER_SITE_OTHER): Payer: Medicare Other | Admitting: Internal Medicine

## 2012-01-02 VITALS — BP 130/70 | Temp 98.5°F | Wt 163.0 lb

## 2012-01-02 DIAGNOSIS — L02612 Cutaneous abscess of left foot: Secondary | ICD-10-CM

## 2012-01-02 DIAGNOSIS — L03116 Cellulitis of left lower limb: Secondary | ICD-10-CM

## 2012-01-02 DIAGNOSIS — D709 Neutropenia, unspecified: Secondary | ICD-10-CM

## 2012-01-02 DIAGNOSIS — M069 Rheumatoid arthritis, unspecified: Secondary | ICD-10-CM

## 2012-01-02 DIAGNOSIS — L02619 Cutaneous abscess of unspecified foot: Secondary | ICD-10-CM

## 2012-01-02 LAB — CBC WITH DIFFERENTIAL/PLATELET
Basophils Absolute: 0 10*3/uL (ref 0.0–0.1)
Hemoglobin: 16.1 g/dL (ref 13.0–17.0)
Lymphocytes Relative: 33.5 % (ref 12.0–46.0)
Monocytes Relative: 19.3 % — ABNORMAL HIGH (ref 3.0–12.0)
Platelets: 79 10*3/uL — ABNORMAL LOW (ref 150.0–400.0)
RDW: 14.6 % (ref 11.5–14.6)

## 2012-01-02 NOTE — Progress Notes (Signed)
Subjective:    Patient ID: Kevin Rubio, male    DOB: Aug 05, 1922, 78 y.o.   MRN: [id number]  HPI   78 year old patient who has a history of rheumatoid arthritis and associated autoimmune neutropenia and thrombocytopenia. For the past 4 days she's noticed some soft tissue swelling involving the plantar surface of his left foot. For the past 2 days she has had some increasing redness involving the distal foot. Yesterday he felt a bit unwell and had some labile blood pressure readings and tachycardia. Today he feels well without constitutional symptoms. He denies any fever or chills. Review of his chart reveals a history of cephalosporin allergy. He does not recall any allergies. He has taken penicillin but not in decades.  Past Medical History  Diagnosis Date  . ANEMIA, PERNICIOUS 05/14/2007  . Atrial fibrillation 08/08/2007  . BENIGN PROSTATIC HYPERTROPHY 03/05/2007  . CORONARY ARTERY DISEASE 03/05/2007  . DYSPLASTIC NEVUS 01/24/2008  . HYPERLIPIDEMIA 03/05/2007  . HYPERTENSION 03/05/2007  . MYOCARDIAL INFARCTION, HX OF 03/05/2007    1982  . NEUTROPENIA NOS 05/11/2007  . Rheumatoid arthritis 03/05/2007  . VITAMIN B12 DEFICIENCY 08/08/2007  . Current use of long term anticoagulation   . Rheumatoid arthritis   . Hernia     umbilical    History   Social History  . Marital Status: Married    Spouse Name: N/A    Number of Children: N/A  . Years of Education: N/A   Occupational History  . Not on file.   Social History Main Topics  . Smoking status: Former Smoker    Quit date: 08/22/1978  . Smokeless tobacco: Not on file  . Alcohol Use: No  . Drug Use: No  . Sexually Active: Not on file   Other Topics Concern  . Not on file   Social History Narrative  . No narrative on file    Past Surgical History  Procedure Date  . Laparoscopic cholecystectomy 11/09/2001    Dr Kendrick Ranch  . Nasal polyp surgery   . Ptca 15 yrs ago  . External ear surgery 2 years ago  . Laparoscopic inguinal  hernia repair 01/27/11    right direct, Dr Gaynelle Adu  . Hernia repair 2012    No family history on file.  Allergies  Allergen Reactions  . Cefpodoxime Proxetil     Current Outpatient Prescriptions on File Prior to Visit  Medication Sig Dispense Refill  . ascorbic Acid (VITAMIN C) 500 MG CPCR Take 500 mg by mouth daily.        . Ferrous Sulfate (IRON CR PO) Take 65 mg by mouth daily.        Marland Kitchen guaiFENesin (MUCINEX) 600 MG 12 hr tablet Take 600 mg by mouth daily.       . metoprolol tartrate (LOPRESSOR) 25 MG tablet 25 mg 3 (three) times daily. Two tablets in the morning, and one tablet in the afternoon       . Multiple Vitamins-Minerals (CENTRUM SILVER PO) Take by mouth daily.        . Omega-3 Fatty Acids (FISH OIL) 1000 MG CAPS Take 2 capsules by mouth daily.        . pravastatin (PRAVACHOL) 40 MG tablet Take 1 tablet (40 mg total) by mouth daily.  90 tablet  6  . Tamsulosin HCl (FLOMAX) 0.4 MG CAPS Take 0.4 mg by mouth daily.        Marland Kitchen warfarin (COUMADIN) 5 MG tablet Take 0.5 tablets (2.5 mg total) by  mouth daily.  90 tablet  6    BP 130/70  Temp(Src) 98.5 F (36.9 C) (Oral)  Wt 163 lb (73.936 kg)       Review of Systems  Constitutional: Negative for fever, chills, appetite change and fatigue.  HENT: Negative for hearing loss, ear pain, congestion, sore throat, trouble swallowing, neck stiffness, dental problem, voice change and tinnitus.   Eyes: Negative for pain, discharge and visual disturbance.  Respiratory: Negative for cough, chest tightness, wheezing and stridor.   Cardiovascular: Negative for chest pain, palpitations and leg swelling.  Gastrointestinal: Negative for nausea, vomiting, abdominal pain, diarrhea, constipation, blood in stool and abdominal distention.  Genitourinary: Negative for urgency, hematuria, flank pain, discharge, difficulty urinating and genital sores.  Musculoskeletal: Negative for myalgias, back pain, joint swelling, arthralgias and gait  problem.  Skin: Positive for rash.  Neurological: Negative for dizziness, syncope, speech difficulty, weakness, numbness and headaches.  Hematological: Negative for adenopathy. Does not bruise/bleed easily.  Psychiatric/Behavioral: Negative for behavioral problems and dysphoric mood. The patient is not nervous/anxious.        Objective:   Physical Exam  Constitutional: He appears well-developed and well-nourished. No distress.  Musculoskeletal:       Examination left foot revealed fluctuance and soft tissue swelling over the metatarsal joints of the left foot. He had a mild hallux valgus deformity as well as hammertoes. There is associated tenderness redness and excessive warmth but also involve the dorsal aspect of the distal foot and toes. Pedal pulses were full          Assessment & Plan:   Left foot abscess and associated cellulitis History of autoimmune neutropenia  We'll check a CBC today and placed on antibiotic therapy. There is a history of a cephalosporin allergy but the patient does not recall we'll place on Augmentin which the patient will start after I&D. We'll set up for orthopedic referral today for I&D with appropriate Gram stain and cultures

## 2012-01-02 NOTE — Patient Instructions (Signed)
Orthopedic evaluation as discussed   Take your antibiotic as prescribed until ALL of it is gone, but stop if you develop a rash, swelling, or any side effects of the medication.  Contact our office as soon as possible if  there are side effects of the medication.  Call or return to clinic prn if these symptoms worsen or fail to improve as anticipated.

## 2012-01-05 ENCOUNTER — Ambulatory Visit (INDEPENDENT_AMBULATORY_CARE_PROVIDER_SITE_OTHER): Payer: Medicare Other | Admitting: Internal Medicine

## 2012-01-05 ENCOUNTER — Encounter: Payer: Self-pay | Admitting: Internal Medicine

## 2012-01-05 VITALS — BP 110/70 | Temp 97.9°F | Wt 165.0 lb

## 2012-01-05 DIAGNOSIS — I10 Essential (primary) hypertension: Secondary | ICD-10-CM

## 2012-01-05 DIAGNOSIS — D709 Neutropenia, unspecified: Secondary | ICD-10-CM

## 2012-01-05 NOTE — Progress Notes (Signed)
  Subjective:    Patient ID: Kevin Rubio, male    DOB: 07/27/1922, 78 y.o.   MRN: [id number]  HPI 78 year old patient who is seen today in followup. He was seen 3 days ago with a suspected abscess involving the plantar aspect of his left the foot over the MTP joints. He was placed on Augmentin at that time and referred to orthopedics. It was felt that he might require I&D for a deep soft tissue abscess. He has done well without surgical intervention and did have a MRI of the left foot yesterday. He is scheduled for orthopedic followup in 4 days. He has a history of cephalosporin allergy but tolerates the Augmentin quite well. His left foot has improved dramatically with no significant pain and largely resolution of the inflammatory changes of erythema and swelling      Review of Systems  Constitutional: Negative for fever, chills and fatigue.       Objective:   Physical Exam  Constitutional: He appears well-nourished. No distress.       Afebrile no distress  Musculoskeletal:       Examination left foot revealed no significant inflammatory changes. Hammertoes noted;  the area of tenderness and fluctuance over the metatarsal head area largely resolved          Assessment & Plan:  Inflammatory changes left foot largely resolved. Patient had a MRI yesterday and is scheduled for orthopedic followup in 4 days;  we'll continue antibiotic therapy at this time. Orthopedic followup as scheduled hypertension stable Rheumatoid arthritis with associated autoimmune neutropenia

## 2012-01-05 NOTE — Patient Instructions (Signed)
Call or return to clinic prn if these symptoms worsen or fail to improve as anticipated.  Orthopedic followup as scheduled

## 2012-01-23 ENCOUNTER — Ambulatory Visit (INDEPENDENT_AMBULATORY_CARE_PROVIDER_SITE_OTHER): Payer: Medicare Other | Admitting: Internal Medicine

## 2012-01-23 DIAGNOSIS — D510 Vitamin B12 deficiency anemia due to intrinsic factor deficiency: Secondary | ICD-10-CM

## 2012-01-23 DIAGNOSIS — E538 Deficiency of other specified B group vitamins: Secondary | ICD-10-CM

## 2012-01-23 MED ORDER — CYANOCOBALAMIN 1000 MCG/ML IJ SOLN
1000.0000 ug | Freq: Once | INTRAMUSCULAR | Status: AC
  Administered 2012-01-23: 1000 ug via INTRAMUSCULAR

## 2012-02-20 ENCOUNTER — Ambulatory Visit (INDEPENDENT_AMBULATORY_CARE_PROVIDER_SITE_OTHER): Payer: Medicare Other | Admitting: Internal Medicine

## 2012-02-20 DIAGNOSIS — D649 Anemia, unspecified: Secondary | ICD-10-CM

## 2012-02-20 DIAGNOSIS — E538 Deficiency of other specified B group vitamins: Secondary | ICD-10-CM

## 2012-02-20 MED ORDER — CYANOCOBALAMIN 1000 MCG/ML IJ SOLN
1000.0000 ug | Freq: Once | INTRAMUSCULAR | Status: AC
  Administered 2012-02-20: 1000 ug via INTRAMUSCULAR

## 2012-03-02 ENCOUNTER — Ambulatory Visit (INDEPENDENT_AMBULATORY_CARE_PROVIDER_SITE_OTHER): Payer: Medicare Other | Admitting: Internal Medicine

## 2012-03-02 ENCOUNTER — Encounter: Payer: Self-pay | Admitting: Internal Medicine

## 2012-03-02 VITALS — BP 124/72 | Temp 98.1°F | Wt 161.0 lb

## 2012-03-02 DIAGNOSIS — I10 Essential (primary) hypertension: Secondary | ICD-10-CM

## 2012-03-02 DIAGNOSIS — E785 Hyperlipidemia, unspecified: Secondary | ICD-10-CM

## 2012-03-02 DIAGNOSIS — I4891 Unspecified atrial fibrillation: Secondary | ICD-10-CM

## 2012-03-02 DIAGNOSIS — R197 Diarrhea, unspecified: Secondary | ICD-10-CM

## 2012-03-02 DIAGNOSIS — E538 Deficiency of other specified B group vitamins: Secondary | ICD-10-CM

## 2012-03-02 MED ORDER — HYOSCYAMINE SULFATE 0.125 MG SL SUBL: 0.1250 mg | SUBLINGUAL_TABLET | SUBLINGUAL | Status: DC | PRN

## 2012-03-02 NOTE — Patient Instructions (Addendum)
ALIGN  One daily  Okay to use antidiarrheal as necessary  High Fiber Diet A high fiber diet changes your normal diet to include more whole grains, legumes, fruits, and vegetables. Changes in the diet involve replacing refined carbohydrates with unrefined foods. The calorie level of the diet is essentially unchanged. The Dietary Reference Intake (recommended amount) for adult males is 38 g per day. For adult females, it is 25 g per day. Pregnant and lactating women should consume 28 g of fiber per day. Fiber is the intact part of a plant that is not broken down during digestion. Functional fiber is fiber that has been isolated from the plant to provide a beneficial effect in the body. PURPOSE  Increase stool bulk.   Ease and regulate bowel movements.   Lower cholesterol.  INDICATIONS THAT YOU NEED MORE FIBER  Constipation and hemorrhoids.   Uncomplicated diverticulosis (intestine condition) and irritable bowel syndrome.   Weight management.   As a protective measure against hardening of the arteries (atherosclerosis), diabetes, and cancer.  NOTE OF CAUTION If you have a digestive or bowel problem, ask your caregiver for advice before adding high fiber foods to your diet. Some of the following medical problems are such that a high fiber diet should not be used without consulting your caregiver:  Acute diverticulitis (intestine infection).   Partial small bowel obstructions.   Complicated diverticular disease involving bleeding, rupture (perforation), or abscess (boil, furuncle).   Presence of autonomic neuropathy (nerve damage) or gastric paresis (stomach cannot empty itself).  GUIDELINES FOR INCREASING FIBER  Start adding fiber to the diet slowly. A gradual increase of about 5 more grams (2 slices of whole-wheat bread, 2 servings of most fruits or vegetables, or 1 bowl of high fiber cereal) per day is best. Too rapid an increase in fiber may result in constipation, flatulence, and  bloating.   Drink enough water and fluids to keep your urine clear or pale yellow. Water, juice, or caffeine-free drinks are recommended. Not drinking enough fluid may cause constipation.   Eat a variety of high fiber foods rather than one type of fiber.   Try to increase your intake of fiber through using high fiber foods rather than fiber pills or supplements that contain small amounts of fiber.   The goal is to change the types of food eaten. Do not supplement your present diet with high fiber foods, but replace foods in your present diet.  INCLUDE A VARIETY OF FIBER SOURCES  Replace refined and processed grains with whole grains, canned fruits with fresh fruits, and incorporate other fiber sources. White rice, white breads, and most bakery goods contain little or no fiber.   Brown whole-grain rice, buckwheat oats, and many fruits and vegetables are all good sources of fiber. These include: broccoli, Brussels sprouts, cabbage, cauliflower, beets, sweet potatoes, white potatoes (skin on), carrots, tomatoes, eggplant, squash, berries, fresh fruits, and dried fruits.   Cereals appear to be the richest source of fiber. Cereal fiber is found in whole grains and bran. Bran is the fiber-rich outer coat of cereal grain, which is largely removed in refining. In whole-grain cereals, the bran remains. In breakfast cereals, the largest amount of fiber is found in those with "bran" in their names. The fiber content is sometimes indicated on the label.   You may need to include additional fruits and vegetables each day.   In baking, for 1 cup white flour, you may use the following substitutions:   1 cup whole-wheat  flour minus 2 tbs.    cup white flour plus  cup whole-wheat flour.  Document Released: 08/08/2005 Document Revised: 07/28/2011 Document Reviewed: 06/16/2009 [facility] Patient Information 2012 [place], [place].

## 2012-03-02 NOTE — Progress Notes (Signed)
Subjective:    Patient ID: Kevin Rubio, male    DOB: 13-Jul-1922, 78 y.o.   MRN: [id number]  HPI  78 year old patient who has a history of hypertension B12 deficiency dyslipidemia and coronary artery disease. He has atrial fibrillation and has been on chronic Coumadin therapy. His general medical status has been stable He presents today with a four-week history of intermittent diarrhea. At times assaultiveness with the constipation for at least 2 or 3 days without bowel movements. Stool is described as loose but not watery.  His chief problem is abdominal cramping associated with uncontrollable diarrhea at times resulting in  Incontinence.  Past Medical History  Diagnosis Date  . ANEMIA, PERNICIOUS 05/14/2007  . Atrial fibrillation 08/08/2007  . BENIGN PROSTATIC HYPERTROPHY 03/05/2007  . CORONARY ARTERY DISEASE 03/05/2007  . DYSPLASTIC NEVUS 01/24/2008  . HYPERLIPIDEMIA 03/05/2007  . HYPERTENSION 03/05/2007  . MYOCARDIAL INFARCTION, HX OF 03/05/2007    1982  . NEUTROPENIA NOS 05/11/2007  . Rheumatoid arthritis 03/05/2007  . VITAMIN B12 DEFICIENCY 08/08/2007  . Current use of long term anticoagulation   . Rheumatoid arthritis   . Hernia     umbilical    History   Social History  . Marital Status: Married    Spouse Name: N/A    Number of Children: N/A  . Years of Education: N/A   Occupational History  . Not on file.   Social History Main Topics  . Smoking status: Former Smoker    Quit date: 08/22/1978  . Smokeless tobacco: Not on file  . Alcohol Use: No  . Drug Use: No  . Sexually Active: Not on file   Other Topics Concern  . Not on file   Social History Narrative  . No narrative on file    Past Surgical History  Procedure Date  . Laparoscopic cholecystectomy 11/09/2001    Dr Kendrick Ranch  . Nasal polyp surgery   . Ptca 15 yrs ago  . External ear surgery 2 years ago  . Laparoscopic inguinal hernia repair 01/27/11    right direct, Dr Gaynelle Adu  . Hernia repair 2012     No family history on file.  Allergies  Allergen Reactions  . Cefpodoxime Proxetil     Current Outpatient Prescriptions on File Prior to Visit  Medication Sig Dispense Refill  . amoxicillin-clavulanate (AUGMENTIN) 875-125 MG per tablet       . ascorbic Acid (VITAMIN C) 500 MG CPCR Take 500 mg by mouth daily.        . cyanocobalamin (,VITAMIN B-12,) 1000 MCG/ML injection Inject 1,000 mcg into the muscle every 30 (thirty) days.      . Ferrous Sulfate (IRON CR PO) Take 65 mg by mouth daily.        Marland Kitchen guaiFENesin (MUCINEX) 600 MG 12 hr tablet Take 600 mg by mouth daily.       . metoprolol tartrate (LOPRESSOR) 25 MG tablet 25 mg 3 (three) times daily. Two tablets in the morning, and one tablet in the afternoon       . Multiple Vitamins-Minerals (CENTRUM SILVER PO) Take by mouth daily.        . Omega-3 Fatty Acids (FISH OIL) 1000 MG CAPS Take 2 capsules by mouth daily.        . pravastatin (PRAVACHOL) 40 MG tablet Take 1 tablet (40 mg total) by mouth daily.  90 tablet  6  . Tamsulosin HCl (FLOMAX) 0.4 MG CAPS Take 0.4 mg by mouth daily.        Marland Kitchen  warfarin (COUMADIN) 5 MG tablet Take 0.5 tablets (2.5 mg total) by mouth daily.  90 tablet  6    BP 124/72  Temp 98.1 F (36.7 C) (Oral)  Wt 161 lb (73.029 kg)       Review of Systems  Constitutional: Negative for fever, chills, appetite change and fatigue.  HENT: Negative for hearing loss, ear pain, congestion, sore throat, trouble swallowing, neck stiffness, dental problem, voice change and tinnitus.   Eyes: Negative for pain, discharge and visual disturbance.  Respiratory: Negative for cough, chest tightness, wheezing and stridor.   Cardiovascular: Negative for chest pain, palpitations and leg swelling.  Gastrointestinal: Positive for abdominal pain and diarrhea. Negative for nausea, vomiting, constipation, blood in stool and abdominal distention.  Genitourinary: Negative for urgency, hematuria, flank pain, discharge, difficulty  urinating and genital sores.  Musculoskeletal: Negative for myalgias, back pain, joint swelling, arthralgias and gait problem.  Skin: Negative for rash.  Neurological: Negative for dizziness, syncope, speech difficulty, weakness, numbness and headaches.  Hematological: Negative for adenopathy. Does not bruise/bleed easily.  Psychiatric/Behavioral: Negative for behavioral problems and dysphoric mood. The patient is not nervous/anxious.        Objective:   Physical Exam  Constitutional: He is oriented to person, place, and time. He appears well-developed.  HENT:  Head: Normocephalic.  Right Ear: External ear normal.  Left Ear: External ear normal.  Eyes: Conjunctivae and EOM are normal.  Neck: Normal range of motion.  Cardiovascular: Normal rate and normal heart sounds.   Pulmonary/Chest: Breath sounds normal.  Abdominal: Soft. Bowel sounds are normal. He exhibits no distension. There is no tenderness. There is no rebound and no guarding.  Musculoskeletal: Normal range of motion. He exhibits no edema and no tenderness.  Neurological: He is alert and oriented to person, place, and time.  Psychiatric: He has a normal mood and affect. His behavior is normal.          Assessment & Plan:   Episodic abdominal cramping with diarrhea. We'll treat with ALIGN for 3 weeks and placed on a high fiber diet. We'll give a prescription for Levsin to take when necessary. Will recheck in one month Coronary artery disease stable Hypertension stable

## 2012-03-22 ENCOUNTER — Ambulatory Visit (INDEPENDENT_AMBULATORY_CARE_PROVIDER_SITE_OTHER): Payer: Medicare Other | Admitting: Internal Medicine

## 2012-03-22 DIAGNOSIS — E538 Deficiency of other specified B group vitamins: Secondary | ICD-10-CM

## 2012-03-22 MED ORDER — CYANOCOBALAMIN 1000 MCG/ML IJ SOLN
1000.0000 ug | Freq: Once | INTRAMUSCULAR | Status: AC
  Administered 2012-03-22: 1000 ug via INTRAMUSCULAR

## 2012-03-26 ENCOUNTER — Ambulatory Visit: Payer: Medicare Other | Admitting: Internal Medicine

## 2012-04-17 ENCOUNTER — Encounter: Payer: Medicare Other | Admitting: Internal Medicine

## 2012-04-19 ENCOUNTER — Encounter: Payer: Self-pay | Admitting: Internal Medicine

## 2012-04-19 ENCOUNTER — Ambulatory Visit (INDEPENDENT_AMBULATORY_CARE_PROVIDER_SITE_OTHER): Payer: Medicare Other | Admitting: Internal Medicine

## 2012-04-19 VITALS — BP 130/68 | Temp 97.9°F | Ht 69.0 in | Wt 161.0 lb

## 2012-04-19 DIAGNOSIS — E785 Hyperlipidemia, unspecified: Secondary | ICD-10-CM

## 2012-04-19 DIAGNOSIS — M069 Rheumatoid arthritis, unspecified: Secondary | ICD-10-CM

## 2012-04-19 DIAGNOSIS — Z0000 Encounter for general adult medical examination without abnormal findings: Secondary | ICD-10-CM

## 2012-04-19 DIAGNOSIS — I2510 Atherosclerotic heart disease of native coronary artery without angina pectoris: Secondary | ICD-10-CM

## 2012-04-19 DIAGNOSIS — E538 Deficiency of other specified B group vitamins: Secondary | ICD-10-CM

## 2012-04-19 DIAGNOSIS — I10 Essential (primary) hypertension: Secondary | ICD-10-CM

## 2012-04-19 DIAGNOSIS — I4891 Unspecified atrial fibrillation: Secondary | ICD-10-CM

## 2012-04-19 DIAGNOSIS — D709 Neutropenia, unspecified: Secondary | ICD-10-CM

## 2012-04-19 LAB — COMPREHENSIVE METABOLIC PANEL
ALT: 18 U/L (ref 0–53)
AST: 28 U/L (ref 0–37)
Albumin: 3.6 g/dL (ref 3.5–5.2)
Calcium: 8.9 mg/dL (ref 8.4–10.5)
Chloride: 103 mEq/L (ref 96–112)
Potassium: 3.6 mEq/L (ref 3.5–5.1)
Sodium: 137 mEq/L (ref 135–145)

## 2012-04-19 LAB — CBC WITH DIFFERENTIAL/PLATELET
Basophils Absolute: 0 10*3/uL (ref 0.0–0.1)
Eosinophils Absolute: 0 10*3/uL (ref 0.0–0.7)
Hemoglobin: 16.5 g/dL (ref 13.0–17.0)
Lymphocytes Relative: 25.9 % (ref 12.0–46.0)
MCHC: 33.4 g/dL (ref 30.0–36.0)
Monocytes Absolute: 0.3 10*3/uL (ref 0.1–1.0)
Neutro Abs: 0.9 10*3/uL — ABNORMAL LOW (ref 1.4–7.7)
Neutrophils Relative %: 54.5 % (ref 43.0–77.0)
RDW: 14.9 % — ABNORMAL HIGH (ref 11.5–14.6)

## 2012-04-19 LAB — TSH: TSH: 4.6 u[IU]/mL (ref 0.35–5.50)

## 2012-04-19 NOTE — Progress Notes (Signed)
Subjective:    Patient ID: Kevin Rubio, male    DOB: 06/16/1922, 78 y.o.   MRN: [id number]  HPI  78 year old patient who is seen today for a wellness exam. He has atrial for ablation and is followed by cardiology. Approximately once every 6-8 weeks he notes some irregularity associated with weakness. Symptoms last approximately 3 hours. He remains on chronic Coumadin anticoagulation. As well as beta blocker therapy  Past Medical History  Diagnosis Date  . ANEMIA, PERNICIOUS 05/14/2007  . Atrial fibrillation 08/08/2007  . BENIGN PROSTATIC HYPERTROPHY 03/05/2007  . CORONARY ARTERY DISEASE 03/05/2007  . DYSPLASTIC NEVUS 01/24/2008  . HYPERLIPIDEMIA 03/05/2007  . HYPERTENSION 03/05/2007  . MYOCARDIAL INFARCTION, HX OF 03/05/2007    1982  . NEUTROPENIA NOS 05/11/2007  . Rheumatoid arthritis 03/05/2007  . VITAMIN B12 DEFICIENCY 08/08/2007  . Current use of long term anticoagulation   . Rheumatoid arthritis   . Hernia     umbilical    History   Social History  . Marital Status: Married    Spouse Name: N/A    Number of Children: N/A  . Years of Education: N/A   Occupational History  . Not on file.   Social History Main Topics  . Smoking status: Former Smoker    Quit date: 08/22/1978  . Smokeless tobacco: Not on file  . Alcohol Use: No  . Drug Use: No  . Sexually Active: Not on file   Other Topics Concern  . Not on file   Social History Narrative  . No narrative on file    Past Surgical History  Procedure Date  . Laparoscopic cholecystectomy 11/09/2001    Dr Kendrick Ranch  . Nasal polyp surgery   . Ptca 15 yrs ago  . External ear surgery 2 years ago  . Laparoscopic inguinal hernia repair 01/27/11    right direct, Dr Gaynelle Adu  . Hernia repair 2012    No family history on file.  Allergies  Allergen Reactions  . Cefpodoxime Proxetil     Current Outpatient Prescriptions on File Prior to Visit  Medication Sig Dispense Refill  . ascorbic Acid (VITAMIN C) 500 MG CPCR Take  500 mg by mouth daily.        . cyanocobalamin (,VITAMIN B-12,) 1000 MCG/ML injection Inject 1,000 mcg into the muscle every 30 (thirty) days.      . Ferrous Sulfate (IRON CR PO) Take 65 mg by mouth daily.        Marland Kitchen guaiFENesin (MUCINEX) 600 MG 12 hr tablet Take 600 mg by mouth daily.       . metoprolol tartrate (LOPRESSOR) 25 MG tablet 25 mg 3 (three) times daily. Two tablets in the morning, and one tablet in the afternoon       . Multiple Vitamins-Minerals (CENTRUM SILVER PO) Take by mouth daily.        . Omega-3 Fatty Acids (FISH OIL) 1000 MG CAPS Take 2 capsules by mouth daily.        . pravastatin (PRAVACHOL) 40 MG tablet Take 1 tablet (40 mg total) by mouth daily.  90 tablet  6  . Tamsulosin HCl (FLOMAX) 0.4 MG CAPS Take 0.4 mg by mouth daily.        Marland Kitchen warfarin (COUMADIN) 5 MG tablet Take 0.5 tablets (2.5 mg total) by mouth daily.  90 tablet  6  . hyoscyamine (LEVSIN/SL) 0.125 MG SL tablet Place 1 tablet (0.125 mg total) under the tongue every 4 (four) hours as needed for  cramping.  30 tablet  0    BP 130/68  Temp 97.9 F (36.6 C) (Oral)  Ht 5\' 9"  (1.753 m)  Wt 161 lb (73.029 kg)  BMI 23.78 kg/m2  Medicare Wellness:  1. Risk factors, based on past  M,S,F history-  patient has known coronary artery disease as well as dyslipidemia he has chronic atrial fibrillation  2.  Physical activities: Remains fairly active in spite of his age and rheumatoid arthritis  3.  Depression/mood: No history of depression or mood disorder  4.  Hearing: Moderately severe impairment. Uses hearing aids  5.  ADL's: Independent in all aspects of daily living  6.  Fall risk: Moderate due to age and arthritis  7.  Home safety: No problems identified  8.  Height weight, and visual acuity;  9.  Counseling: Continue monthly INRs active lifestyle and restricted sodium diet  10. Lab orders based on risk factors: Has had a recent INR. Has had recent lab data to his inguinal hernia repair 11. Referral :  Followup cardiology  12. Care plan: Continue present medical regimen 13. Cognitive assessment: Alert and oriented normal affect. No cognitive dysfunction        Review of Systems  Constitutional: Positive for fatigue. Negative for fever, chills, activity change and appetite change.  HENT: Negative for hearing loss, ear pain, congestion, rhinorrhea, sneezing, mouth sores, trouble swallowing, neck pain, neck stiffness, dental problem, voice change, sinus pressure and tinnitus.   Eyes: Negative for photophobia, pain, redness and visual disturbance.  Respiratory: Negative for apnea, cough, choking, chest tightness, shortness of breath and wheezing.   Cardiovascular: Positive for palpitations. Negative for chest pain and leg swelling.  Gastrointestinal: Negative for nausea, vomiting, abdominal pain, diarrhea, constipation, blood in stool, abdominal distention, anal bleeding and rectal pain.  Genitourinary: Negative for dysuria, urgency, frequency, hematuria, flank pain, decreased urine volume, discharge, penile swelling, scrotal swelling, difficulty urinating, genital sores and testicular pain.  Musculoskeletal: Negative for myalgias, back pain, joint swelling, arthralgias and gait problem.  Skin: Negative for color change, rash and wound.  Neurological: Positive for weakness. Negative for dizziness, tremors, seizures, syncope, facial asymmetry, speech difficulty, light-headedness, numbness and headaches.  Hematological: Negative for adenopathy. Bruises/bleeds easily.  Psychiatric/Behavioral: Negative for suicidal ideas, hallucinations, behavioral problems, confusion, disturbed wake/sleep cycle, self-injury, dysphoric mood, decreased concentration and agitation. The patient is not nervous/anxious.        Objective:   Physical Exam  Constitutional: He appears well-developed and well-nourished.  HENT:  Head: Normocephalic and atraumatic.  Right Ear: External ear normal.  Left Ear: External  ear normal.  Nose: Nose normal.  Mouth/Throat: Oropharynx is clear and moist.  Eyes: Conjunctivae and EOM are normal. Pupils are equal, round, and reactive to light. No scleral icterus.  Neck: Normal range of motion. Neck supple. No JVD present. No thyromegaly present.  Cardiovascular: Normal rate, regular rhythm, normal heart sounds and intact distal pulses.  Exam reveals no gallop and no friction rub.   No murmur heard. Pulmonary/Chest: Effort normal and breath sounds normal. He exhibits no tenderness.  Abdominal: Soft. Bowel sounds are normal. He exhibits no distension and no mass. There is no tenderness.  Genitourinary: Penis normal. Guaiac negative stool.       Prostate enlargement with  prominent left lobe  Musculoskeletal: Normal range of motion. He exhibits no edema and no tenderness.       Joint deformities of RA with ulnar deviation  Lymphadenopathy:    He has no cervical adenopathy.  Neurological: He is alert. He has normal reflexes. No cranial nerve deficit. Coordination normal.  Skin: Skin is warm and dry. No rash noted.       Scattered ecchymoses over the arms  Psychiatric: He has a normal mood and affect. His behavior is normal.          Assessment & Plan:  Preventive health examination Rheumatoid arthritis Atrial fibrillation Autoimmune neutropenia and thrombocytopenia coronary artery disease Hypertension Pernicious anemia   Medical regimen unchanged Laboratory update will be reviewed Recheck 4 months

## 2012-04-19 NOTE — Patient Instructions (Signed)
Limit your sodium (Salt) intake  Return in 4 months for follow-up  

## 2012-05-22 ENCOUNTER — Ambulatory Visit (INDEPENDENT_AMBULATORY_CARE_PROVIDER_SITE_OTHER): Payer: Medicare Other | Admitting: Internal Medicine

## 2012-05-22 DIAGNOSIS — E538 Deficiency of other specified B group vitamins: Secondary | ICD-10-CM

## 2012-05-22 DIAGNOSIS — Z23 Encounter for immunization: Secondary | ICD-10-CM

## 2012-05-22 DIAGNOSIS — D510 Vitamin B12 deficiency anemia due to intrinsic factor deficiency: Secondary | ICD-10-CM

## 2012-05-22 MED ORDER — CYANOCOBALAMIN 1000 MCG/ML IJ SOLN
1000.0000 ug | Freq: Once | INTRAMUSCULAR | Status: AC
  Administered 2012-05-22: 1000 ug via INTRAMUSCULAR

## 2012-06-15 ENCOUNTER — Ambulatory Visit (HOSPITAL_BASED_OUTPATIENT_CLINIC_OR_DEPARTMENT_OTHER): Payer: Medicare Other | Admitting: Oncology

## 2012-06-15 ENCOUNTER — Other Ambulatory Visit (HOSPITAL_BASED_OUTPATIENT_CLINIC_OR_DEPARTMENT_OTHER): Payer: Medicare Other

## 2012-06-15 ENCOUNTER — Telehealth: Payer: Self-pay | Admitting: Oncology

## 2012-06-15 VITALS — BP 138/65 | HR 54 | Temp 97.1°F | Resp 18 | Ht 69.0 in | Wt 161.7 lb

## 2012-06-15 DIAGNOSIS — D696 Thrombocytopenia, unspecified: Secondary | ICD-10-CM

## 2012-06-15 DIAGNOSIS — D510 Vitamin B12 deficiency anemia due to intrinsic factor deficiency: Secondary | ICD-10-CM

## 2012-06-15 DIAGNOSIS — D709 Neutropenia, unspecified: Secondary | ICD-10-CM

## 2012-06-15 DIAGNOSIS — E538 Deficiency of other specified B group vitamins: Secondary | ICD-10-CM

## 2012-06-15 DIAGNOSIS — M069 Rheumatoid arthritis, unspecified: Secondary | ICD-10-CM

## 2012-06-15 LAB — COMPREHENSIVE METABOLIC PANEL (CC13)
AST: 25 U/L (ref 5–34)
Albumin: 3.6 g/dL (ref 3.5–5.0)
Alkaline Phosphatase: 80 U/L (ref 40–150)
BUN: 20 mg/dL (ref 7.0–26.0)
Creatinine: 1.1 mg/dL (ref 0.7–1.3)
Glucose: 109 mg/dl — ABNORMAL HIGH (ref 70–99)
Total Bilirubin: 1.1 mg/dL (ref 0.20–1.20)

## 2012-06-15 LAB — CBC WITH DIFFERENTIAL/PLATELET
BASO%: 1 % (ref 0.0–2.0)
Basophils Absolute: 0 10*3/uL (ref 0.0–0.1)
EOS%: 2.6 % (ref 0.0–7.0)
HGB: 16.6 g/dL (ref 13.0–17.1)
MCH: 32.5 pg (ref 27.2–33.4)
RBC: 5.1 10*6/uL (ref 4.20–5.82)
RDW: 14.4 % (ref 11.0–14.6)
lymph#: 0.8 10*3/uL — ABNORMAL LOW (ref 0.9–3.3)
nRBC: 0 % (ref 0–0)

## 2012-06-15 NOTE — Progress Notes (Signed)
Hematology and Oncology Follow Up Visit  Kevin Rubio [id number] 09/02/1921 77 y.o. 06/15/2012 1:29 PM  CC: Kevin Savers, MD  Kevin Rubio, M.D.  Kevin Rubio, M.D.    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  Current therapy: Observation and surveillance. He is S/P bone marrow biopsy done in 06/2009 that was unrevealing.   Interim History:  Kevin Rubio presents today for a followup visit.  He has continued to really have no complications related to his neutropenia and thrombocytopenia.  He had not had any fevers, had not had any chills, had not had any opportunistic infection or hospitalization.  He had not had any epistaxis.  His performance status and activity level remain at reasonable range.  His arthritis has been under reasonable control. No major changes since last visit. He is not reporting any decline in energy or performance status.   Medications: I have reviewed the patient's current medications. Current outpatient prescriptions:ascorbic Acid (VITAMIN C) 500 MG CPCR, Take 500 mg by mouth daily.  , Disp: , Rfl: ;  cyanocobalamin (,VITAMIN B-12,) 1000 MCG/ML injection, Inject 1,000 mcg into the muscle every 30 (thirty) days., Disp: , Rfl: ;  Ferrous Sulfate (IRON CR PO), Take 65 mg by mouth daily.  , Disp: , Rfl: ;  guaiFENesin (MUCINEX) 600 MG 12 hr tablet, Take 600 mg by mouth daily. , Disp: , Rfl:  metoprolol tartrate (LOPRESSOR) 25 MG tablet, 25 mg 3 (three) times daily. Two tablets in the morning, and one tablet in the afternoon , Disp: , Rfl: ;  Multiple Vitamins-Minerals (CENTRUM SILVER PO), Take by mouth daily.  , Disp: , Rfl: ;  Omega-3 Fatty Acids (FISH OIL) 1000 MG CAPS, Take 2 capsules by mouth daily.  , Disp: , Rfl: ;  pravastatin (PRAVACHOL) 40 MG tablet, Take 1 tablet (40 mg total) by mouth daily., Disp: 90 tablet, Rfl: 6 Tamsulosin HCl  (FLOMAX) 0.4 MG CAPS, Take 0.4 mg by mouth daily.  , Disp: , Rfl: ;  warfarin (COUMADIN) 5 MG tablet, Take 0.5 tablets (2.5 mg total) by mouth daily., Disp: 90 tablet, Rfl: 6;  hyoscyamine (LEVSIN/SL) 0.125 MG SL tablet, Place 1 tablet (0.125 mg total) under the tongue every 4 (four) hours as needed for cramping., Disp: 30 tablet, Rfl: 0  Allergies:  Allergies  Allergen Reactions  . Cefpodoxime Proxetil     Past Medical History, Surgical history, Social history, and Family History were reviewed and updated.  Review of Systems: Constitutional:  Negative for fever, chills, night sweats, anorexia, weight loss, pain. Cardiovascular: no chest pain or dyspnea on exertion Respiratory: negative Neurological: negative Dermatological: negative ENT: negative Skin: Negative. Gastrointestinal: negative Genito-Urinary: negative Hematological and Lymphatic: negative Breast: negative Musculoskeletal: negative Remaining ROS negative. Physical Exam: Blood pressure 138/65, pulse 54, temperature 97.1 F (36.2 C), temperature source Oral, resp. rate 18, height 5\' 9"  (1.753 m), weight 161 lb 11.2 oz (73.347 kg). ECOG: 1 General appearance: alert Head: Normocephalic, without obvious abnormality, atraumatic Neck: no adenopathy, no carotid bruit, no JVD, supple, symmetrical, trachea midline and thyroid not enlarged, symmetric, no tenderness/mass/nodules Lymph nodes: Cervical, supraclavicular, and axillary nodes normal. Heart:regular rate and rhythm, S1, S2 normal, no murmur, click, rub or gallop Lung:chest clear, no wheezing, rales, normal symmetric air entry Abdomin: soft, non-tender, without masses or organomegaly EXT:no erythema, induration, or nodules   Lab Results: Lab Results  Component Value Date   WBC 1.9*  06/15/2012   HGB 16.6 06/15/2012   HCT 46.7 06/15/2012   MCV 91.6 06/15/2012   PLT 55* 06/15/2012     Chemistry      Component Value Date/Time   NA 137 04/19/2012 1020   K 3.6  04/19/2012 1020   CL 103 04/19/2012 1020   CO2 26 04/19/2012 1020   BUN 15 04/19/2012 1020   CREATININE 0.9 04/19/2012 1020      Component Value Date/Time   CALCIUM 8.9 04/19/2012 1020   ALKPHOS 69 04/19/2012 1020   AST 28 04/19/2012 1020   ALT 18 04/19/2012 1020   BILITOT 0.9 04/19/2012 1020     Impression and Plan:  This is a pleasant 78 year old gentleman with the following issues: 1. Thrombocytopenia and neutropenia, most likely autoimmune in nature vs MDS (although bone marrow biopsy was normal in 2010).  He had not had any recurrent sinopulmonary infection, had not had any bleeding, so the plan is to continue with observation at this time. Certainly, a trial of steroids would be needed if there is any evidence of any infection or bleeding.  We definitely could consider growth factor support such as Neupogen down the line. If he has further decline in his counts in the future, we can consider repeat bone marrow biopsy.  2. Rheumatoid arthritis, seems to be stable.   Kevin Banks, MD 10/25/20131:29 PM

## 2012-06-15 NOTE — Telephone Encounter (Signed)
gv and printed appt schedule for pt for April 2014 ° °

## 2012-06-22 ENCOUNTER — Ambulatory Visit (INDEPENDENT_AMBULATORY_CARE_PROVIDER_SITE_OTHER): Payer: Medicare Other | Admitting: Internal Medicine

## 2012-06-22 DIAGNOSIS — D518 Other vitamin B12 deficiency anemias: Secondary | ICD-10-CM

## 2012-06-22 DIAGNOSIS — D519 Vitamin B12 deficiency anemia, unspecified: Secondary | ICD-10-CM

## 2012-06-22 MED ORDER — CYANOCOBALAMIN 1000 MCG/ML IJ SOLN
1000.0000 ug | Freq: Once | INTRAMUSCULAR | Status: AC
  Administered 2012-06-22: 1000 ug via INTRAMUSCULAR

## 2012-07-23 ENCOUNTER — Ambulatory Visit (INDEPENDENT_AMBULATORY_CARE_PROVIDER_SITE_OTHER): Payer: Medicare Other | Admitting: Internal Medicine

## 2012-07-23 DIAGNOSIS — E538 Deficiency of other specified B group vitamins: Secondary | ICD-10-CM

## 2012-07-23 MED ORDER — CYANOCOBALAMIN 1000 MCG/ML IJ SOLN
1000.0000 ug | Freq: Once | INTRAMUSCULAR | Status: AC
  Administered 2012-07-23: 1000 ug via INTRAMUSCULAR

## 2012-08-20 ENCOUNTER — Ambulatory Visit (INDEPENDENT_AMBULATORY_CARE_PROVIDER_SITE_OTHER): Payer: Medicare Other | Admitting: Internal Medicine

## 2012-08-20 ENCOUNTER — Encounter: Payer: Self-pay | Admitting: Internal Medicine

## 2012-08-20 VITALS — BP 110/70 | HR 64 | Temp 97.5°F | Resp 18 | Wt 162.0 lb

## 2012-08-20 DIAGNOSIS — I2510 Atherosclerotic heart disease of native coronary artery without angina pectoris: Secondary | ICD-10-CM

## 2012-08-20 DIAGNOSIS — E538 Deficiency of other specified B group vitamins: Secondary | ICD-10-CM

## 2012-08-20 DIAGNOSIS — I4891 Unspecified atrial fibrillation: Secondary | ICD-10-CM

## 2012-08-20 DIAGNOSIS — D709 Neutropenia, unspecified: Secondary | ICD-10-CM

## 2012-08-20 DIAGNOSIS — I10 Essential (primary) hypertension: Secondary | ICD-10-CM

## 2012-08-20 MED ORDER — CYANOCOBALAMIN 1000 MCG/ML IJ SOLN
1000.0000 ug | Freq: Once | INTRAMUSCULAR | Status: AC
  Administered 2012-08-20: 1000 ug via INTRAMUSCULAR

## 2012-08-20 NOTE — Progress Notes (Signed)
Subjective:    Patient ID: Kevin Rubio, male    DOB: [birth year], 78 y.o.   MRN: [id number]  HPI  78 year old patient who is seen today for followup. He has coronary artery disease which has been stable. He is followed by cardiology for atrial for ablation and remains on chronic Coumadin anticoagulation. Cardiology follows anticoagulation. He has hypertension and a history of rheumatoid arthritis. He has autoimmune neutropenia which has been stable. Remains on pravastatin for dyslipidemia. No cardiac symptoms. In general doing quite well. He continues to receive monthly B12 injections for B12 deficiency  Past Medical History  Diagnosis Date  . ANEMIA, PERNICIOUS 05/14/2007  . Atrial fibrillation 08/08/2007  . BENIGN PROSTATIC HYPERTROPHY 03/05/2007  . CORONARY ARTERY DISEASE 03/05/2007  . DYSPLASTIC NEVUS 01/24/2008  . HYPERLIPIDEMIA 03/05/2007  . HYPERTENSION 03/05/2007  . MYOCARDIAL INFARCTION, HX OF 03/05/2007    1982  . NEUTROPENIA NOS 05/11/2007  . Rheumatoid arthritis 03/05/2007  . VITAMIN B12 DEFICIENCY 08/08/2007  . Current use of long term anticoagulation   . Rheumatoid arthritis   . Hernia     umbilical    History   Social History  . Marital Status: Married    Spouse Name: N/A    Number of Children: N/A  . Years of Education: N/A   Occupational History  . Not on file.   Social History Main Topics  . Smoking status: Former Smoker    Quit date: 08/22/1978  . Smokeless tobacco: Not on file  . Alcohol Use: No  . Drug Use: No  . Sexually Active: Not on file   Other Topics Concern  . Not on file   Social History Narrative  . No narrative on file    Past Surgical History  Procedure Date  . Laparoscopic cholecystectomy 11/09/2001    Dr Kendrick Ranch  . Nasal polyp surgery   . Ptca 15 yrs ago  . External ear surgery 2 years ago  . Laparoscopic inguinal hernia repair 01/27/11    right direct, Dr Gaynelle Adu  . Hernia repair 2012    No family history on  file.  Allergies  Allergen Reactions  . Cefpodoxime Proxetil     Current Outpatient Prescriptions on File Prior to Visit  Medication Sig Dispense Refill  . ascorbic Acid (VITAMIN C) 500 MG CPCR Take 500 mg by mouth daily.        . cyanocobalamin (,VITAMIN B-12,) 1000 MCG/ML injection Inject 1,000 mcg into the muscle every 30 (thirty) days.      . Ferrous Sulfate (IRON CR PO) Take 65 mg by mouth daily.        Marland Kitchen guaiFENesin (MUCINEX) 600 MG 12 hr tablet Take 600 mg by mouth daily.       . metoprolol tartrate (LOPRESSOR) 25 MG tablet 25 mg 3 (three) times daily. Two tablets in the morning, and one tablet in the afternoon       . Multiple Vitamins-Minerals (CENTRUM SILVER PO) Take by mouth daily.        . Omega-3 Fatty Acids (FISH OIL) 1000 MG CAPS Take 2 capsules by mouth daily.        . pravastatin (PRAVACHOL) 40 MG tablet Take 1 tablet (40 mg total) by mouth daily.  90 tablet  6  . warfarin (COUMADIN) 5 MG tablet Take 0.5 tablets (2.5 mg total) by mouth daily.  90 tablet  6  . hyoscyamine (LEVSIN/SL) 0.125 MG SL tablet Place 1 tablet (0.125 mg total) under the tongue every  4 (four) hours as needed for cramping.  30 tablet  0  . Tamsulosin HCl (FLOMAX) 0.4 MG CAPS Take 0.4 mg by mouth daily.          BP 110/70  Pulse 64  Temp 97.5 F (36.4 C) (Oral)  Resp 18  Wt 162 lb (73.483 kg)       Review of Systems  Constitutional: Negative for fever, chills, appetite change and fatigue.  HENT: Negative for hearing loss, ear pain, congestion, sore throat, trouble swallowing, neck stiffness, dental problem, voice change and tinnitus.   Eyes: Negative for pain, discharge and visual disturbance.  Respiratory: Negative for cough, chest tightness, wheezing and stridor.   Cardiovascular: Negative for chest pain, palpitations and leg swelling.  Gastrointestinal: Negative for nausea, vomiting, abdominal pain, diarrhea, constipation, blood in stool and abdominal distention.  Genitourinary:  Negative for urgency, hematuria, flank pain, discharge, difficulty urinating and genital sores.  Musculoskeletal: Negative for myalgias, back pain, joint swelling, arthralgias and gait problem.  Skin: Negative for rash.  Neurological: Negative for dizziness, syncope, speech difficulty, weakness, numbness and headaches.  Hematological: Negative for adenopathy. Does not bruise/bleed easily.  Psychiatric/Behavioral: Negative for behavioral problems and dysphoric mood. The patient is not nervous/anxious.        Objective:   Physical Exam  Constitutional: He is oriented to person, place, and time. He appears well-developed.  HENT:  Head: Normocephalic.  Right Ear: External ear normal.  Left Ear: External ear normal.  Eyes: Conjunctivae normal and EOM are normal.  Neck: Normal range of motion.  Cardiovascular: Normal rate and normal heart sounds.   Pulmonary/Chest: Breath sounds normal.  Abdominal: Bowel sounds are normal.  Musculoskeletal: Normal range of motion. He exhibits no edema and no tenderness.  Neurological: He is alert and oriented to person, place, and time.  Psychiatric: He has a normal mood and affect. His behavior is normal.          Assessment & Plan:   Coronary artery disease stable Atrial fibrillation. Continue chronic Coumadin anticoagulation Hypertension well controlled Dyslipidemia B12 deficiency. Monthly B12 shot administered today  Recheck 4 months

## 2012-08-20 NOTE — Patient Instructions (Signed)
Limit your sodium (Salt) intake    It is important that you exercise regularly, at least 20 minutes 3 to 4 times per week.  If you develop chest pain or shortness of breath seek  medical attention. 

## 2012-09-24 ENCOUNTER — Ambulatory Visit (INDEPENDENT_AMBULATORY_CARE_PROVIDER_SITE_OTHER): Payer: Medicare Other | Admitting: *Deleted

## 2012-09-24 DIAGNOSIS — E538 Deficiency of other specified B group vitamins: Secondary | ICD-10-CM

## 2012-09-24 MED ORDER — CYANOCOBALAMIN 1000 MCG/ML IJ SOLN
1000.0000 ug | Freq: Once | INTRAMUSCULAR | Status: AC
  Administered 2012-09-24: 1000 ug via INTRAMUSCULAR

## 2012-10-16 ENCOUNTER — Encounter: Payer: Self-pay | Admitting: Cardiology

## 2012-10-16 DIAGNOSIS — Z7901 Long term (current) use of anticoagulants: Secondary | ICD-10-CM | POA: Insufficient documentation

## 2012-10-16 NOTE — Progress Notes (Signed)
Patient ID: Kevin Rubio, male   DOB: April 27, 1922, 79 y.o.   MRN: [id number]   Kevin, Rubio  Date of visit:  10/16/2012 DOB:  Oct 27, 1921    Age:  79 yrs. Medical record number:  [id number]     Account number:  [id number] Primary Care Provider: Eleonore Chiquito Rubio ____________________________ CURRENT DIAGNOSES  1. CAD,Native  2. Arrhythmia-Atrial Fibrillation  3. Long-term (current) use of anticoagulants  4. Hyperlipidemia  5. MI-S/P Anterior  6. Surgery-PTCA ____________________________ ALLERGIES  Niaspan Extended-Release, Flushing  virus vaccination ____________________________ MEDICATIONS  1. Fish Oil 300-1,000 mg capsule, bid  2. Mucinex 600 mg tablet extended release, 1 p.o. q.d.  3. multivitamin tablet, 1 p.o. q.d.  4. Vitamin C 1,000 mg tablet, 1 p.o. q.d.  5. Vitamin B-12 1,000 mcg/mL Solution, q month  6. Iron (Ferrous Gluconate) 325 mg Tab, one po daily  7. pravastatin 40 mg Tablet, 1 p.o. daily  8. metoprolol tartrate 25 mg tablet, 2 po qam 1 qpm  9. warfarin 3 mg tablet, one qd or Take as directed  10. fluorometholone 0.1 % drops,suspension, 1 gtt os qd  11. doxycycline monohydrate 100 mg tablet, 1 p.o. daily ____________________________ HISTORY OF PRESENT ILLNESS  He patient seen for cardiac followup. He has had a good 6 months from a cardiac viewpoint since he was here. He denies angina and has no PND, orthopnea or edema. He has no bleeding complications from warfarin. He has had a productive cough for several weeks and wants to know what he can have for a cough. He notes only occasional irregularity in his pulse rate. ____________________________ PAST HISTORY  Past Medical Illnesses:  hyperlipidemia, hiatal hernia, rhematoid arthritis, anemia-pernicious, shingles, urosepsis 4/09, pancytopenia;  Cardiovascular Illnesses:  atrial fibrillation, CAD, S/P MI-anterior 1982;  Surgical Procedures:  nasal polyps,wisdom teeth extracted,, cholecystectomy (lap), hernia repair;   Cardiology Procedures-Invasive:  cardiac cath (left) 1997 1986, PTCA of the RCA 1997;  Cardiology Procedures-Noninvasive:  echocardiogram October 2008, echocardiogram February 2012;  Cardiac Cath Results:  normal Left main, occluded mid LAD, luminal irregualrities CFX, subtotal occlusion distal RCA, PTCA of RCA;  LVEF of 60% documented via echocardiogram on 09/22/2010   CHADS Score:  1  CHA2DS2-VASC Score:  3 ____________________________ CARDIO-PULMONARY TEST DATES EKG Date:  10/16/2012;   Cardiac Cath Date:  01/12/1996;  Stent Placement Date: 01/12/1996;  Nuclear Study Date:  08/05/1996;   Echocardiography Date: 09/22/2010;   ____________________________ SOCIAL HISTORY Alcohol Use:  does not use alcohol;  Smoking:  used to smoke but quit 1982;  Diet:  fat modified diet;  Lifestyle:  married;  Exercise:  some exercise;  Occupation:  retired and worked for [employer];  Residence:  lives with wife;   ____________________________ REVIEW OF SYSTEMS General:  denies recent weight change, fatique or change in exercise tolerance.  Eyes:  cataract extraction bilaterally, wears eye glasses/contact lenses  Ears, Nose, Throat, Mouth:  nasal polyps, seasonal sinusitis Respiratory:  cough  Cardiovascular:  please review HPI  Abdominal:  constipation  Genitourinary-Male:  nocturia  Musculoskeletal:  arthritis of the hands ____________________________ PHYSICAL EXAMINATION VITAL SIGNS  Blood Pressure:  124/70 Sitting, Right arm, regular cuff  , 118/66 Standing, Right arm and regular cuff   Pulse:  64/min. Weight:  161.00 lbs. Height:  71"BMI: 22  Constitutional:  pleasant white male in no acute distress Skin:  warm and dry to touch, no apparent skin lesions, or masses noted. Head:  normocephalic, balding male hair pattern ENT:  ears, nose and  throat reveal no gross abnormalities.  Dentition good. Neck:  supple, without massess. No JVD, thyromegaly or carotid bruits. Carotid upstroke  normal. Chest:  normal symmetry, clear to auscultation and percussion. Cardiac:  irregularly irregular rhythm, normal S1 and S2, no S3 or S4, no murmur Peripheral Pulses:  the femoral,dorsalis pedis, and posterior tibial pulses are full and equal bilaterally with no bruits auscultated. Extremities & Back:  ullnar deviation of the fingers, more pronounced on the right hand, swelling and thickening of the hands consistent with rheumatoid arthritis Neurological:  no gross motor or sensory deficits noted, affect appropriate, oriented x3. ____________________________ MOST RECENT LIPID PANEL 08/04/11  CHOL TOTL 98 mg/dl, LDL 47 calc, HDL 27 mg/dl and TRIGLYCER 782 mg/dl ____________________________ IMPRESSIONS/PLAN  1. Coronary artery disease with previous PTCA with no angina 2. Hyperlipidemia currently at goal 3. Atrial fibrillation currently in sinus rhythm 4. Long-term anticoagulation with warfarin without complications 5. Upper respiratory infection with significant productive cough  Recommendations:  Prescribed doxycycline 100 mg daily for upper respiratory infection. Warfarin reviewed with him. Followup in 6 months. Call if recurrent problems. Would like his primary doctor to get a lipid panel on his next visit. ____________________________ TODAYS ORDERS  1. Coag Clinic Visit: Coag OV 2 weeks  2. 12 Lead EKG: Today  3. Return Visit: 6 months                       ____________________________ Cardiology Physician:  Kevin Palmer MD [facility]

## 2012-10-24 ENCOUNTER — Ambulatory Visit (INDEPENDENT_AMBULATORY_CARE_PROVIDER_SITE_OTHER): Payer: Medicare Other | Admitting: Internal Medicine

## 2012-10-24 ENCOUNTER — Other Ambulatory Visit: Payer: Self-pay | Admitting: *Deleted

## 2012-10-24 DIAGNOSIS — E538 Deficiency of other specified B group vitamins: Secondary | ICD-10-CM

## 2012-10-24 MED ORDER — CYANOCOBALAMIN 1000 MCG/ML IJ SOLN
1000.0000 ug | Freq: Once | INTRAMUSCULAR | Status: AC
  Administered 2012-10-24: 1000 ug via INTRAMUSCULAR

## 2012-10-24 MED ORDER — PRAVASTATIN SODIUM 40 MG PO TABS: 40.0000 mg | ORAL_TABLET | Freq: Every day | ORAL | Status: DC

## 2012-11-22 ENCOUNTER — Ambulatory Visit (INDEPENDENT_AMBULATORY_CARE_PROVIDER_SITE_OTHER): Payer: Medicare Other | Admitting: Internal Medicine

## 2012-11-22 DIAGNOSIS — E538 Deficiency of other specified B group vitamins: Secondary | ICD-10-CM

## 2012-11-22 MED ORDER — CYANOCOBALAMIN 1000 MCG/ML IJ SOLN
1000.0000 ug | Freq: Once | INTRAMUSCULAR | Status: AC
  Administered 2012-11-22: 1000 ug via INTRAMUSCULAR

## 2012-12-13 ENCOUNTER — Other Ambulatory Visit (HOSPITAL_BASED_OUTPATIENT_CLINIC_OR_DEPARTMENT_OTHER): Payer: Medicare Other | Admitting: Lab

## 2012-12-13 ENCOUNTER — Ambulatory Visit (HOSPITAL_BASED_OUTPATIENT_CLINIC_OR_DEPARTMENT_OTHER): Payer: Medicare Other | Admitting: Oncology

## 2012-12-13 ENCOUNTER — Telehealth: Payer: Self-pay | Admitting: Oncology

## 2012-12-13 VITALS — BP 145/61 | HR 60 | Temp 97.3°F | Resp 20 | Ht 69.0 in | Wt 162.3 lb

## 2012-12-13 DIAGNOSIS — E538 Deficiency of other specified B group vitamins: Secondary | ICD-10-CM

## 2012-12-13 DIAGNOSIS — D510 Vitamin B12 deficiency anemia due to intrinsic factor deficiency: Secondary | ICD-10-CM

## 2012-12-13 DIAGNOSIS — D709 Neutropenia, unspecified: Secondary | ICD-10-CM

## 2012-12-13 DIAGNOSIS — D696 Thrombocytopenia, unspecified: Secondary | ICD-10-CM

## 2012-12-13 DIAGNOSIS — M069 Rheumatoid arthritis, unspecified: Secondary | ICD-10-CM

## 2012-12-13 LAB — CBC WITH DIFFERENTIAL/PLATELET
Basophils Absolute: 0 10*3/uL (ref 0.0–0.1)
Eosinophils Absolute: 0 10*3/uL (ref 0.0–0.5)
HCT: 45.8 % (ref 38.4–49.9)
HGB: 16.1 g/dL (ref 13.0–17.1)
LYMPH%: 38.6 % (ref 14.0–49.0)
MCHC: 35.2 g/dL (ref 32.0–36.0)
MONO#: 0.2 10*3/uL (ref 0.1–0.9)
NEUT%: 43.3 % (ref 39.0–75.0)
Platelets: 57 10*3/uL — ABNORMAL LOW (ref 140–400)
WBC: 1.3 10*3/uL — ABNORMAL LOW (ref 4.0–10.3)
lymph#: 0.5 10*3/uL — ABNORMAL LOW (ref 0.9–3.3)

## 2012-12-13 LAB — COMPREHENSIVE METABOLIC PANEL (CC13)
AST: 25 U/L (ref 5–34)
Albumin: 3.3 g/dL — ABNORMAL LOW (ref 3.5–5.0)
BUN: 13.4 mg/dL (ref 7.0–26.0)
CO2: 26 mEq/L (ref 22–29)
Calcium: 9.1 mg/dL (ref 8.4–10.4)
Chloride: 106 mEq/L (ref 98–107)
Creatinine: 1.1 mg/dL (ref 0.7–1.3)
Glucose: 146 mg/dl — ABNORMAL HIGH (ref 70–99)
Potassium: 4.6 mEq/L (ref 3.5–5.1)

## 2012-12-13 NOTE — Addendum Note (Signed)
Addended by: Reesa Chew on: 12/13/2012 11:09 AM   Modules accepted: Orders

## 2012-12-13 NOTE — Progress Notes (Signed)
Hematology and Oncology Follow Up Visit  NASHAUN HILLMER [id number] 1922/07/30 79 y.o. 12/13/2012 10:42 AM  CC: Gordy Savers, MD  Georga Hacking, M.D.  Hermelinda Medicus, M.D.    Principle Diagnosis: This is an 79 year old gentleman with neutropenia and thrombocytopenia, most likely autoimmune in nature related to rheumatoid arthritis.  Current therapy: Observation and surveillance. He is S/P bone marrow biopsy done in 06/2009 that was unrevealing.   Interim History:  Mr. Fortin presents today for a followup visit.  He has continued to really have no complications related to his neutropenia and thrombocytopenia.  He had not had any fevers, had not had any chills, had not had any opportunistic infection or hospitalization.  He had not had any epistaxis.  His performance status and activity level remain at reasonable range.  His arthritis has been under reasonable control. No major changes since last visit. He is not reporting any decline in energy or performance status. No bleeding or hospitalizations.   Medications: I have reviewed the patient's current medications. Current outpatient prescriptions:ascorbic Acid (VITAMIN C) 500 MG CPCR, Take 500 mg by mouth daily.  , Disp: , Rfl: ;  cyanocobalamin (,VITAMIN B-12,) 1000 MCG/ML injection, Inject 1,000 mcg into the muscle every 30 (thirty) days., Disp: , Rfl: ;  Ferrous Sulfate (IRON CR PO), Take 65 mg by mouth daily.  , Disp: , Rfl: ;  guaiFENesin (MUCINEX) 600 MG 12 hr tablet, Take 600 mg by mouth daily. , Disp: , Rfl:  hyoscyamine (LEVSIN/SL) 0.125 MG SL tablet, Place 1 tablet (0.125 mg total) under the tongue every 4 (four) hours as needed for cramping., Disp: 30 tablet, Rfl: 0;  metoprolol tartrate (LOPRESSOR) 25 MG tablet, 25 mg 3 (three) times daily. Two tablets in the morning, and one tablet in the afternoon , Disp: , Rfl: ;  Multiple Vitamins-Minerals (CENTRUM SILVER PO), Take by mouth daily.  , Disp: , Rfl:  Omega-3 Fatty Acids (FISH  OIL) 1000 MG CAPS, Take 2 capsules by mouth daily.  , Disp: , Rfl: ;  pravastatin (PRAVACHOL) 40 MG tablet, Take 1 tablet (40 mg total) by mouth daily., Disp: 90 tablet, Rfl: 3;  Tamsulosin HCl (FLOMAX) 0.4 MG CAPS, Take 0.4 mg by mouth daily.  , Disp: , Rfl: ;  warfarin (COUMADIN) 5 MG tablet, Take 0.5 tablets (2.5 mg total) by mouth daily., Disp: 90 tablet, Rfl: 6  Allergies:  Allergies  Allergen Reactions  . Cefpodoxime Proxetil     Past Medical History, Surgical history, Social history, and Family History were reviewed and updated.  Review of Systems: Constitutional:  Negative for fever, chills, night sweats, anorexia, weight loss, pain. Cardiovascular: no chest pain or dyspnea on exertion Respiratory: negative Neurological: negative Dermatological: negative ENT: negative Skin: Negative. Gastrointestinal: negative Genito-Urinary: negative Hematological and Lymphatic: negative Breast: negative Musculoskeletal: negative Remaining ROS negative. Physical Exam: Blood pressure 145/61, pulse 60, temperature 97.3 F (36.3 C), temperature source Oral, resp. rate 20, height 5\' 9"  (1.753 m), weight 162 lb 4.8 oz (73.619 kg). ECOG: 1 General appearance: alert Head: Normocephalic, without obvious abnormality, atraumatic Neck: no adenopathy, no carotid bruit, no JVD, supple, symmetrical, trachea midline and thyroid not enlarged, symmetric, no tenderness/mass/nodules Lymph nodes: Cervical, supraclavicular, and axillary nodes normal. Heart:regular rate and rhythm, S1, S2 normal, no murmur, click, rub or gallop Lung:chest clear, no wheezing, rales, normal symmetric air entry Abdomin: soft, non-tender, without masses or organomegaly EXT:no erythema, induration, or nodules   Lab Results: Lab Results  Component Value Date  WBC 1.3* 12/13/2012   HGB 16.1 12/13/2012   HCT 45.8 12/13/2012   MCV 90.5 12/13/2012   PLT 57* 12/13/2012     Chemistry      Component Value Date/Time   NA 139  06/15/2012 1257   NA 137 04/19/2012 1020   K 4.4 06/15/2012 1257   K 3.6 04/19/2012 1020   CL 108* 06/15/2012 1257   CL 103 04/19/2012 1020   CO2 25 06/15/2012 1257   CO2 26 04/19/2012 1020   BUN 20.0 06/15/2012 1257   BUN 15 04/19/2012 1020   CREATININE 1.1 06/15/2012 1257   CREATININE 0.9 04/19/2012 1020      Component Value Date/Time   CALCIUM 9.4 06/15/2012 1257   CALCIUM 8.9 04/19/2012 1020   ALKPHOS 80 06/15/2012 1257   ALKPHOS 69 04/19/2012 1020   AST 25 06/15/2012 1257   AST 28 04/19/2012 1020   ALT 14 06/15/2012 1257   ALT 18 04/19/2012 1020   BILITOT 1.10 06/15/2012 1257   BILITOT 0.9 04/19/2012 1020     Impression and Plan:  This is a pleasant 79 year old gentleman with the following issues: 1. Thrombocytopenia and neutropenia, most likely autoimmune in nature vs MDS (although bone marrow biopsy was normal in 2010).  He had not had any recurrent sinopulmonary infection, had not had any bleeding, so the plan is to continue with observation at this time. Certainly, a trial of steroids would be needed if there is any evidence of any infection or bleeding.  We definitely could consider growth factor support such as Neupogen down the line. If he has further decline in his counts in the future, we can consider repeat bone marrow biopsy.  2. Rheumatoid arthritis, seems to be stable.   [facility], MD 4/24/201410:42 AM

## 2012-12-17 ENCOUNTER — Encounter: Payer: Self-pay | Admitting: Internal Medicine

## 2012-12-17 ENCOUNTER — Ambulatory Visit (INDEPENDENT_AMBULATORY_CARE_PROVIDER_SITE_OTHER): Payer: Medicare Other | Admitting: Internal Medicine

## 2012-12-17 VITALS — BP 128/80 | HR 70 | Temp 97.7°F | Resp 20 | Wt 162.0 lb

## 2012-12-17 DIAGNOSIS — E538 Deficiency of other specified B group vitamins: Secondary | ICD-10-CM

## 2012-12-17 DIAGNOSIS — I10 Essential (primary) hypertension: Secondary | ICD-10-CM

## 2012-12-17 DIAGNOSIS — I2510 Atherosclerotic heart disease of native coronary artery without angina pectoris: Secondary | ICD-10-CM

## 2012-12-17 DIAGNOSIS — I4891 Unspecified atrial fibrillation: Secondary | ICD-10-CM

## 2012-12-17 DIAGNOSIS — M069 Rheumatoid arthritis, unspecified: Secondary | ICD-10-CM

## 2012-12-17 DIAGNOSIS — D709 Neutropenia, unspecified: Secondary | ICD-10-CM

## 2012-12-17 DIAGNOSIS — Z7901 Long term (current) use of anticoagulants: Secondary | ICD-10-CM

## 2012-12-17 MED ORDER — CYANOCOBALAMIN 1000 MCG/ML IJ SOLN
1000.0000 ug | Freq: Once | INTRAMUSCULAR | Status: AC
  Administered 2012-12-17: 1000 ug via INTRAMUSCULAR

## 2012-12-17 NOTE — Progress Notes (Signed)
Subjective:    Patient ID: Kevin Rubio, male    DOB: 09-18-1921, 79 y.o.   MRN: [id number]  HPI  79 year old patient who is seen today for followup. He is followed by cardiology due 2 coronary artery disease and chronic atrial fibrillation. He remains on chronic Coumadin anticoagulation. He has a history of RA, by autoimmune neutropenia and thrombocytopenia. He is followed by hematology and has had no infectious or bleeding complications. In general he is doing quite well. Denies any cardiopulmonary complaints. Cardiology follows anticoagulation He is treated hypertension and is maintained on pravastatin for dyslipidemia. He continues to receive monthly B12 shots for B12 deficiency.  Past Medical History  Diagnosis Date  . ANEMIA, PERNICIOUS 05/14/2007  . Atrial fibrillation 08/08/2007  . BENIGN PROSTATIC HYPERTROPHY 03/05/2007  . CORONARY ARTERY DISEASE 03/05/2007  . DYSPLASTIC NEVUS 01/24/2008  . HYPERLIPIDEMIA 03/05/2007  . HYPERTENSION 03/05/2007  . MYOCARDIAL INFARCTION, HX OF 03/05/2007    1982  . NEUTROPENIA NOS 05/11/2007  . Rheumatoid arthritis 03/05/2007  . VITAMIN B12 DEFICIENCY 08/08/2007  . Current use of long term anticoagulation   . Rheumatoid arthritis   . Hernia     umbilical    History   Social History  . Marital Status: Married    Spouse Name: N/A    Number of Children: N/A  . Years of Education: N/A   Occupational History  . Not on file.   Social History Main Topics  . Smoking status: Former Smoker    Quit date: 08/22/1978  . Smokeless tobacco: Not on file  . Alcohol Use: No  . Drug Use: No  . Sexually Active: Not on file   Other Topics Concern  . Not on file   Social History Narrative  . No narrative on file    Past Surgical History  Procedure Laterality Date  . Laparoscopic cholecystectomy  11/09/2001    Dr Kendrick Ranch  . Nasal polyp surgery    . Ptca  15 yrs ago  . External ear surgery  2 years ago  . Laparoscopic inguinal hernia repair  01/27/11     right direct, Dr Gaynelle Adu  . Hernia repair  2012    History reviewed. No pertinent family history.  Allergies  Allergen Reactions  . Cefpodoxime Proxetil     Current Outpatient Prescriptions on File Prior to Visit  Medication Sig Dispense Refill  . ascorbic Acid (VITAMIN C) 500 MG CPCR Take 500 mg by mouth daily.        . cyanocobalamin (,VITAMIN B-12,) 1000 MCG/ML injection Inject 1,000 mcg into the muscle every 30 (thirty) days.      . Ferrous Sulfate (IRON CR PO) Take 65 mg by mouth daily.        Marland Kitchen guaiFENesin (MUCINEX) 600 MG 12 hr tablet Take 600 mg by mouth daily.       . metoprolol tartrate (LOPRESSOR) 25 MG tablet 25 mg 3 (three) times daily. Two tablets in the morning, and one tablet in the afternoon       . Multiple Vitamins-Minerals (CENTRUM SILVER PO) Take by mouth daily.        . Omega-3 Fatty Acids (FISH OIL) 1000 MG CAPS Take 2 capsules by mouth daily.        . pravastatin (PRAVACHOL) 40 MG tablet Take 1 tablet (40 mg total) by mouth daily.  90 tablet  3  . Tamsulosin HCl (FLOMAX) 0.4 MG CAPS Take 0.4 mg by mouth daily.        Marland Kitchen  warfarin (COUMADIN) 4 MG tablet Take 4 mg by mouth daily.       No current facility-administered medications on file prior to visit.    BP 128/80  Pulse 70  Temp(Src) 97.7 F (36.5 C) (Oral)  Resp 20  Wt 162 lb (73.483 kg)  BMI 23.91 kg/m2  SpO2 97%       Review of Systems  Constitutional: Negative for fever, chills, appetite change and fatigue.  HENT: Negative for hearing loss, ear pain, congestion, sore throat, trouble swallowing, neck stiffness, dental problem, voice change and tinnitus.   Eyes: Negative for pain, discharge and visual disturbance.  Respiratory: Negative for cough, chest tightness, wheezing and stridor.   Cardiovascular: Negative for chest pain, palpitations and leg swelling.  Gastrointestinal: Negative for nausea, vomiting, abdominal pain, diarrhea, constipation, blood in stool and abdominal distention.   Genitourinary: Negative for urgency, hematuria, flank pain, discharge, difficulty urinating and genital sores.  Musculoskeletal: Negative for myalgias, back pain, joint swelling, arthralgias and gait problem.  Skin: Negative for rash.  Neurological: Negative for dizziness, syncope, speech difficulty, weakness, numbness and headaches.  Hematological: Negative for adenopathy. Does not bruise/bleed easily.  Psychiatric/Behavioral: Negative for behavioral problems and dysphoric mood. The patient is not nervous/anxious.        Objective:   Physical Exam  Constitutional: He is oriented to person, place, and time. He appears well-developed.  Repeat blood pressure 120/70  HENT:  Head: Normocephalic.  Right Ear: External ear normal.  Left Ear: External ear normal.  Eyes: Conjunctivae and EOM are normal.  Neck: Normal range of motion.  Cardiovascular: Normal rate and normal heart sounds.   Controlled ventricular response  Pulmonary/Chest: Effort normal. No respiratory distress. He has no wheezes. He has rales.  Bibasilar rales right greater than the left  Abdominal: Bowel sounds are normal.  Musculoskeletal: Normal range of motion. He exhibits no edema and no tenderness.  Rheumatoid hands right greater than the left. Right hand with severe ulnar subluxation of the fingers  Neurological: He is alert and oriented to person, place, and time.  Psychiatric: He has a normal mood and affect. His behavior is normal.          Assessment & Plan:   Chronic atrial fibrillation Chronic Coumadin anti-coagulation-monitoring per cardiology Anemia neutropenia/thrombocytopenia Hypertension stable RA- quiescent Dyslipidemia. Continue pravastatin  Recheck 6 months

## 2012-12-17 NOTE — Patient Instructions (Signed)
Limit your sodium (Salt) intake    It is important that you exercise regularly, at least 20 minutes 3 to 4 times per week.  If you develop chest pain or shortness of breath seek  medical attention.  Return in 6 months for follow-up  

## 2013-02-07 ENCOUNTER — Ambulatory Visit (INDEPENDENT_AMBULATORY_CARE_PROVIDER_SITE_OTHER): Payer: Medicare Other | Admitting: Internal Medicine

## 2013-02-07 DIAGNOSIS — E538 Deficiency of other specified B group vitamins: Secondary | ICD-10-CM

## 2013-02-07 MED ORDER — CYANOCOBALAMIN 1000 MCG/ML IJ SOLN
1000.0000 ug | Freq: Once | INTRAMUSCULAR | Status: AC
  Administered 2013-02-07: 1000 ug via INTRAMUSCULAR

## 2013-03-07 ENCOUNTER — Ambulatory Visit (INDEPENDENT_AMBULATORY_CARE_PROVIDER_SITE_OTHER): Payer: Medicare Other | Admitting: Internal Medicine

## 2013-03-07 DIAGNOSIS — E538 Deficiency of other specified B group vitamins: Secondary | ICD-10-CM

## 2013-03-07 MED ORDER — CYANOCOBALAMIN 1000 MCG/ML IJ SOLN
1000.0000 ug | Freq: Once | INTRAMUSCULAR | Status: AC
  Administered 2013-03-07: 1000 ug via INTRAMUSCULAR

## 2013-04-08 ENCOUNTER — Ambulatory Visit (INDEPENDENT_AMBULATORY_CARE_PROVIDER_SITE_OTHER): Payer: Medicare Other | Admitting: *Deleted

## 2013-04-08 DIAGNOSIS — E538 Deficiency of other specified B group vitamins: Secondary | ICD-10-CM

## 2013-04-08 MED ORDER — CYANOCOBALAMIN 1000 MCG/ML IJ SOLN
1000.0000 ug | Freq: Once | INTRAMUSCULAR | Status: AC
  Administered 2013-04-08: 1000 ug via INTRAMUSCULAR

## 2013-05-01 ENCOUNTER — Encounter: Payer: Self-pay | Admitting: Cardiology

## 2013-05-01 NOTE — Progress Notes (Signed)
Patient ID: Kevin Rubio, male   DOB: Jan 29, 1922, 79 y.o.   MRN: [id number]  Kevin Rubio, Kevin Rubio  Date of visit:  04/30/2013 DOB:  1921-12-13    Age:  79 yrs. Medical record number:  [id number]     Account number:  [id number] Primary Care Provider: Eleonore Chiquito F ____________________________ CURRENT DIAGNOSES  1. CAD,Native  2. Arrhythmia-Atrial Fibrillation  3. Long-term (current) use of anticoagulants  4. Hyperlipidemia  5. MI-S/P Anterior  6. Surgery-PTCA ____________________________ ALLERGIES  Flucelvax, Intolerance-unknown  Niacin, Flushing ____________________________ MEDICATIONS  1. Fish Oil 300-1,000 mg capsule, bid  2. Mucinex 600 mg tablet extended release, 1 p.o. q.d.  3. multivitamin tablet, 1 p.o. q.d.  4. Vitamin C 1,000 mg tablet, 1 p.o. q.d.  5. Vitamin B-12 1,000 mcg/mL Solution, q month  6. Iron (Ferrous Gluconate) 325 mg Tab, one po daily  7. pravastatin 40 mg Tablet, 1 p.o. daily  8. fluorometholone 0.1 % drops,suspension, 1 gtt os qd  9. warfarin 4 mg tablet, 1 p.o. daily or as directed  10. metoprolol tartrate 25 mg tablet, 2 po qam 1 qpm  11. Lubrifresh PM 83-15 % ointment, ou q hs ____________________________ CHIEF COMPLAINTS  Followup of Arrhythmia-Atrial Fibrillation  Followup of CAD,Native ____________________________ HISTORY OF PRESENT ILLNESS Patient seen for cardiac followup. He has been doing well since he was previously here. He has had no bleeding complications from warfarin. He denies angina and has no PND, orthopnea or edema. ___________________________ PAST HISTORY  Past Medical Illnesses:  hyperlipidemia, hiatal hernia, rhematoid arthritis, anemia-pernicious, shingles, urosepsis 4/09, pancytopenia;  Cardiovascular Illnesses:  atrial fibrillation, CAD, S/P MI-anterior 1982;  Surgical Procedures:  nasal polyps,wisdom teeth extracted,, cholecystectomy (lap), hernia repair;  Cardiology Procedures-Invasive:  cardiac cath (left) 1997 1986, PTCA of the RCA  1997;  Cardiology Procedures-Noninvasive:  echocardiogram October 2008, echocardiogram February 2012;  Cardiac Cath Results:  normal Left main, occluded mid LAD, luminal irregualrities CFX, subtotal occlusion distal RCA, PTCA of RCA;  LVEF of 60% documented via echocardiogram on 09/22/2010,  CHADS Score:  1,  CHA2DS2-VASC Score:  3 ____________________________ CARDIO-PULMONARY TEST DATES EKG Date:  10/16/2012;   Cardiac Cath Date:  01/12/1996;  Stent Placement Date: 01/12/1996;  Nuclear Study Date:  08/05/1996;  Echocardiography Date: 09/22/2010;   ____________________________ FAMILY HISTORY Father -- Cancer, Deceased Mother -- Diabetes mellitus, Coronary Artery Disease, Deceased ____________________________ SOCIAL HISTORY Alcohol Use:  does not use alcohol;  Smoking:  used to smoke but quit 1982;  Diet:  fat modified diet;  Lifestyle:  married;  Exercise:  some exercise;  Occupation:  retired and worked for [employer];  Residence:  lives with wife;   ____________________________ REVIEW OF SYSTEMS General:  denies recent weight change, fatique or change in exercise tolerance. Eyes: cataract extraction bilaterally, wears eye glasses/contact lenses Ears, Nose, Throat, Mouth:  nasal polyps, seasonal sinusitis Respiratory: denies dyspnea, cough, wheezing or hemoptysis. Cardiovascular:  please review HPI Abdominal: constipation Genitourinary-Male: nocturia  Musculoskeletal:  arthritis of the hands  ____________________________ PHYSICAL EXAMINATION VITAL SIGNS  Blood Pressure:  110/60 Sitting, Left arm, regular cuff   Pulse:  68/min. Weight:  162.00 lbs. Height:  71"BMI: 22  Constitutional:  pleasant white male in no acute distress Skin:  warm and dry to touch, no apparent skin lesions, or masses noted. Head:  normocephalic, balding male hair pattern ENT:  ears, nose and throat reveal no gross abnormalities.  Dentition good. Neck:  supple, without massess. No JVD, thyromegaly or  carotid bruits. Carotid upstroke normal. Chest:  normal symmetry, clear to auscultation and percussion. Cardiac:  regular rhythm, normal S1 and S2, no S3 or S4, no murmurs,clicks, or rub heard Peripheral Pulses:  the femoral,dorsalis pedis, and posterior tibial pulses are full and equal bilaterally with no bruits auscultated. Extremities & Back:  ullnar deviation of the fingers, more pronounced on the right hand, swelling and thickening of the hands consistent with rheumatoid arthritis Neurological:  no gross motor or sensory deficits noted, affect appropriate, oriented x3. ____________________________ MOST RECENT LIPID PANEL 08/04/11  CHOL TOTL 98 mg/dl, LDL 47 calc, HDL 27 mg/dl and TRIGLYCER 409 mg/dl ____________________________ IMPRESSIONS/PLAN  1. Coronary artery disease with previous PTCA with no angina 2. Hyperlipidemia currently at goal 3. Atrial fibrillation currently in sinus rhythm 4. Long-term anticoagulation with warfarin without complications  Recommendations:  Clinically he is doing well at this time with no recurrence of angina. He has no bleeding complications from warfarin. Recommended followup in 6 months and call if problems. ____________________________ TODAYS ORDERS  1. Coag Clinic Visit: Coag OV 1 month  2. Return Visit: 6 months  3. 12 Lead EKG: 6 months                       ____________________________ Cardiology Physician:  Darden Palmer [facility]

## 2013-05-09 ENCOUNTER — Ambulatory Visit (INDEPENDENT_AMBULATORY_CARE_PROVIDER_SITE_OTHER): Payer: Medicare Other | Admitting: *Deleted

## 2013-05-09 DIAGNOSIS — E538 Deficiency of other specified B group vitamins: Secondary | ICD-10-CM

## 2013-05-09 MED ORDER — CYANOCOBALAMIN 1000 MCG/ML IJ SOLN
1000.0000 ug | Freq: Once | INTRAMUSCULAR | Status: AC
  Administered 2013-05-09: 1000 ug via INTRAMUSCULAR

## 2013-05-27 ENCOUNTER — Ambulatory Visit (INDEPENDENT_AMBULATORY_CARE_PROVIDER_SITE_OTHER): Payer: Medicare Other | Admitting: Internal Medicine

## 2013-05-27 ENCOUNTER — Encounter: Payer: Self-pay | Admitting: Internal Medicine

## 2013-05-27 VITALS — BP 126/80 | HR 60 | Temp 97.7°F | Resp 20 | Ht 68.25 in | Wt 158.0 lb

## 2013-05-27 DIAGNOSIS — I10 Essential (primary) hypertension: Secondary | ICD-10-CM

## 2013-05-27 DIAGNOSIS — M069 Rheumatoid arthritis, unspecified: Secondary | ICD-10-CM

## 2013-05-27 DIAGNOSIS — M0500 Felty's syndrome, unspecified site: Secondary | ICD-10-CM | POA: Insufficient documentation

## 2013-05-27 DIAGNOSIS — Z0000 Encounter for general adult medical examination without abnormal findings: Secondary | ICD-10-CM

## 2013-05-27 DIAGNOSIS — Z7901 Long term (current) use of anticoagulants: Secondary | ICD-10-CM

## 2013-05-27 DIAGNOSIS — E785 Hyperlipidemia, unspecified: Secondary | ICD-10-CM

## 2013-05-27 DIAGNOSIS — Z23 Encounter for immunization: Secondary | ICD-10-CM

## 2013-05-27 DIAGNOSIS — I4891 Unspecified atrial fibrillation: Secondary | ICD-10-CM

## 2013-05-27 DIAGNOSIS — E538 Deficiency of other specified B group vitamins: Secondary | ICD-10-CM

## 2013-05-27 DIAGNOSIS — I2510 Atherosclerotic heart disease of native coronary artery without angina pectoris: Secondary | ICD-10-CM

## 2013-05-27 DIAGNOSIS — N400 Enlarged prostate without lower urinary tract symptoms: Secondary | ICD-10-CM

## 2013-05-27 NOTE — Patient Instructions (Signed)
Limit your sodium (Salt) intake    It is important that you exercise regularly, at least 20 minutes 3 to 4 times per week.  If you develop chest pain or shortness of breath seek  medical attention.  Cardiology and oncology followup as scheduled  Return in one year for follow-up

## 2013-05-27 NOTE — Progress Notes (Signed)
Patient ID: Kevin Rubio, male   DOB: 1921/12/22, 79 y.o.   MRN: [id number]  Subjective:    Patient ID: Kevin Rubio, male    DOB: Oct 26, 1921, 79 y.o.   MRN: [id number]  HPI  79 year-old patient who is seen today for a wellness exam. He has atrial fibrillation and is followed by cardiology. 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  He is followed by oncology and cardiology every 6 months and has monthly INRs. He continues to have fast irregular heart rhythm occasionally lasting 2-3 hours. During this period of time he feels slightly weak. In general doing quite well.  Past Medical History  Diagnosis Date  . ANEMIA, PERNICIOUS 05/14/2007  . Atrial fibrillation 08/08/2007  . BENIGN PROSTATIC HYPERTROPHY 03/05/2007  . CORONARY ARTERY DISEASE 03/05/2007  . DYSPLASTIC NEVUS 01/24/2008  . HYPERLIPIDEMIA 03/05/2007  . HYPERTENSION 03/05/2007  . MYOCARDIAL INFARCTION, HX OF 03/05/2007    1982  . NEUTROPENIA NOS 05/11/2007  . Rheumatoid arthritis(714.0) 03/05/2007  . VITAMIN B12 DEFICIENCY 08/08/2007  . Current use of long term anticoagulation   . Rheumatoid arthritis(714.0)   . Hernia     umbilical    History   Social History  . Marital Status: Married    Spouse Name: N/A    Number of Children: N/A  . Years of Education: N/A   Occupational History  . Not on file.   Social History Main Topics  . Smoking status: Former Smoker    Quit date: 08/22/1978  . Smokeless tobacco: Not on file  . Alcohol Use: No  . Drug Use: No  . Sexual Activity: Not on file   Other Topics Concern  . Not on file   Social History Narrative  . No narrative on file    Past Surgical History  Procedure Laterality Date  . Laparoscopic cholecystectomy  11/09/2001    Dr Kendrick Ranch  . Nasal polyp surgery    . Ptca  15 yrs ago  . External ear surgery  2 years ago  . Laparoscopic inguinal hernia repair  01/27/11    right direct, Dr Gaynelle Adu  . Hernia repair  2012    History  reviewed. No pertinent family history.  Allergies  Allergen Reactions  . Cefpodoxime Proxetil     Current Outpatient Prescriptions on File Prior to Visit  Medication Sig Dispense Refill  . ascorbic Acid (VITAMIN C) 500 MG CPCR Take 500 mg by mouth daily.        . cyanocobalamin (,VITAMIN B-12,) 1000 MCG/ML injection Inject 1,000 mcg into the muscle every 30 (thirty) days.      . Ferrous Sulfate (IRON CR PO) Take 65 mg by mouth daily.        Marland Kitchen guaiFENesin (MUCINEX) 600 MG 12 hr tablet Take 600 mg by mouth daily.       . metoprolol tartrate (LOPRESSOR) 25 MG tablet 25 mg 3 (three) times daily. Two tablets in the morning, and one tablet in the afternoon       . Multiple Vitamins-Minerals (CENTRUM SILVER PO) Take by mouth daily.        . Omega-3 Fatty Acids (FISH OIL) 1000 MG CAPS Take 2 capsules by mouth daily.        . pravastatin (PRAVACHOL) 40 MG tablet Take 1 tablet (40 mg total) by mouth daily.  90 tablet  3  . Tamsulosin HCl (FLOMAX) 0.4 MG CAPS Take 0.4 mg by mouth daily.        Marland Kitchen  warfarin (COUMADIN) 4 MG tablet Take 4 mg by mouth daily.       No current facility-administered medications on file prior to visit.    BP 126/80  Pulse 60  Temp(Src) 97.7 F (36.5 C) (Oral)  Resp 20  Ht 5' 8.25" (1.734 m)  Wt 158 lb (71.668 kg)  BMI 23.84 kg/m2  Medicare Wellness:  1. Risk factors, based on past  M,S,F history-  patient has known coronary artery disease as well as dyslipidemia he has chronic atrial fibrillation  2.  Physical activities: Remains fairly active in spite of his age and rheumatoid arthritis  3.  Depression/mood: No history of depression or mood disorder  4.  Hearing: Moderately severe impairment. Uses hearing aids  5.  ADL's: Independent in all aspects of daily living  6.  Fall risk: Moderate due to age and arthritis  7.  Home safety: No problems identified  8.  Height weight, and visual acuity;  9.  Counseling: Continue monthly INRs active lifestyle and  restricted sodium diet  10. Lab orders based on risk factors: Has had a recent INR. Has had recent lab data to his inguinal hernia repair 11. Referral : Followup cardiology  12. Care plan: Continue present medical regimen 13. Cognitive assessment: Alert and oriented normal affect. No cognitive dysfunction        Review of Systems  Constitutional: Positive for fatigue. Negative for fever, chills, activity change and appetite change.  HENT: Negative for hearing loss, ear pain, congestion, rhinorrhea, sneezing, mouth sores, trouble swallowing, neck pain, neck stiffness, dental problem, voice change, sinus pressure and tinnitus.   Eyes: Negative for photophobia, pain, redness and visual disturbance.  Respiratory: Negative for apnea, cough, choking, chest tightness, shortness of breath and wheezing.   Cardiovascular: Positive for palpitations. Negative for chest pain and leg swelling.  Gastrointestinal: Negative for nausea, vomiting, abdominal pain, diarrhea, constipation, blood in stool, abdominal distention, anal bleeding and rectal pain.  Genitourinary: Negative for dysuria, urgency, frequency, hematuria, flank pain, decreased urine volume, discharge, penile swelling, scrotal swelling, difficulty urinating, genital sores and testicular pain.  Musculoskeletal: Negative for myalgias, back pain, joint swelling, arthralgias and gait problem.  Skin: Negative for color change, rash and wound.  Neurological: Positive for weakness. Negative for dizziness, tremors, seizures, syncope, facial asymmetry, speech difficulty, light-headedness, numbness and headaches.  Hematological: Negative for adenopathy. Bruises/bleeds easily.  Psychiatric/Behavioral: Negative for suicidal ideas, hallucinations, behavioral problems, confusion, sleep disturbance, self-injury, dysphoric mood, decreased concentration and agitation. The patient is not nervous/anxious.        Objective:   Physical Exam  Constitutional:  He appears well-developed and well-nourished.  HENT:  Head: Normocephalic and atraumatic.  Right Ear: External ear normal.  Left Ear: External ear normal.  Nose: Nose normal.  Mouth/Throat: Oropharynx is clear and moist.  Edentulous  Eyes: Conjunctivae and EOM are normal. Pupils are equal, round, and reactive to light. No scleral icterus.  Neck: Normal range of motion. Neck supple. No JVD present. No thyromegaly present.  Cardiovascular: Normal rate, regular rhythm and normal heart sounds.  Exam reveals no gallop and no friction rub.   No murmur heard. Dorsalis pedis pulses are intact;  the posterior tibial pulses not easily palpable  Pulmonary/Chest: Effort normal and breath sounds normal. He exhibits no tenderness.  Abdominal: Soft. Bowel sounds are normal. He exhibits no distension and no mass. There is no tenderness.  Genitourinary: Penis normal. Guaiac negative stool.  Prostate enlargement with  prominent left lobe  Musculoskeletal: Normal  range of motion. He exhibits no edema and no tenderness.  Joint deformities of RA with ulnar deviation  Lymphadenopathy:    He has no cervical adenopathy.  Neurological: He is alert. He has normal reflexes. No cranial nerve deficit. Coordination normal.  Skin: Skin is warm and dry. No rash noted.  Scattered ecchymoses over the arms  Psychiatric: He has a normal mood and affect. His behavior is normal.          Assessment & Plan:  Preventive health examination Rheumatoid arthritis Atrial fibrillation Autoimmune neutropenia and thrombocytopenia  coronary artery disease Hypertension Pernicious anemia   Medical regimen unchanged Laboratory update will be reviewed Recheck 4 months

## 2013-05-28 ENCOUNTER — Telehealth: Payer: Self-pay | Admitting: Oncology

## 2013-05-28 ENCOUNTER — Other Ambulatory Visit (HOSPITAL_BASED_OUTPATIENT_CLINIC_OR_DEPARTMENT_OTHER): Payer: Medicare Other

## 2013-05-28 ENCOUNTER — Ambulatory Visit (HOSPITAL_BASED_OUTPATIENT_CLINIC_OR_DEPARTMENT_OTHER): Payer: Medicare Other | Admitting: Oncology

## 2013-05-28 VITALS — BP 127/70 | HR 61 | Temp 96.9°F | Resp 20 | Ht 68.28 in | Wt 159.6 lb

## 2013-05-28 DIAGNOSIS — M069 Rheumatoid arthritis, unspecified: Secondary | ICD-10-CM

## 2013-05-28 DIAGNOSIS — D696 Thrombocytopenia, unspecified: Secondary | ICD-10-CM

## 2013-05-28 DIAGNOSIS — D709 Neutropenia, unspecified: Secondary | ICD-10-CM

## 2013-05-28 LAB — COMPREHENSIVE METABOLIC PANEL (CC13)
ALT: 16 U/L (ref 0–55)
Albumin: 3.3 g/dL — ABNORMAL LOW (ref 3.5–5.0)
Alkaline Phosphatase: 75 U/L (ref 40–150)
CO2: 27 mEq/L (ref 22–29)
Glucose: 94 mg/dl (ref 70–140)
Potassium: 4.8 mEq/L (ref 3.5–5.1)
Sodium: 139 mEq/L (ref 136–145)
Total Protein: 7.7 g/dL (ref 6.4–8.3)

## 2013-05-28 LAB — CBC WITH DIFFERENTIAL/PLATELET
Basophils Absolute: 0 10*3/uL (ref 0.0–0.1)
EOS%: 1.5 % (ref 0.0–7.0)
Eosinophils Absolute: 0 10*3/uL (ref 0.0–0.5)
HGB: 15.8 g/dL (ref 13.0–17.1)
NEUT#: 0.4 10*3/uL — CL (ref 1.5–6.5)
RDW: 15.2 % — ABNORMAL HIGH (ref 11.0–14.6)
lymph#: 0.7 10*3/uL — ABNORMAL LOW (ref 0.9–3.3)
nRBC: 0 % (ref 0–0)

## 2013-05-28 NOTE — Telephone Encounter (Signed)
Gave pt appt for lab and MD on May 2015 °

## 2013-05-28 NOTE — Progress Notes (Signed)
Hematology and Oncology Follow Up Visit  Kevin Rubio [id number] February 19, 1922 79 y.o. 05/28/2013 12:07 PM  CC: Kevin Savers, MD  Kevin Rubio, M.D.  Kevin Rubio, M.D.    Principle Diagnosis: This is an 79 year old gentleman with neutropenia and thrombocytopenia, most likely autoimmune in nature related to rheumatoid arthritis.  Current therapy: Observation and surveillance. He is S/P bone marrow biopsy done in 06/2009 that was unrevealing.   Interim History:  Kevin Rubio presents today for a followup visit.  He reports no issues since his last visit. He has continued to really have no complications related to his neutropenia and thrombocytopenia.  He had not had any fevers, had not had any chills, had not had any opportunistic infection or hospitalization.  He had not had any epistaxis.  His performance status and activity level remain at reasonable range.  His arthritis has been under reasonable control. No major changes since last visit. He is not reporting any decline in energy or performance status. No bleeding or hospitalizations.   Medications: I have reviewed the patient's current medications.   Current Outpatient Prescriptions  Medication Sig Dispense Refill  . ascorbic Acid (VITAMIN C) 500 MG CPCR Take 500 mg by mouth daily.        . cyanocobalamin (,VITAMIN B-12,) 1000 MCG/ML injection Inject 1,000 mcg into the muscle every 30 (thirty) days.      . Ferrous Sulfate (IRON CR PO) Take 65 mg by mouth daily.        Marland Kitchen guaiFENesin (MUCINEX) 600 MG 12 hr tablet Take 600 mg by mouth daily.       . metoprolol tartrate (LOPRESSOR) 25 MG tablet 25 mg 3 (three) times daily. Two tablets in the morning, and one tablet in the afternoon       . Multiple Vitamins-Minerals (CENTRUM SILVER PO) Take by mouth daily.        . Omega-3 Fatty Acids (FISH OIL) 1000 MG CAPS Take 2 capsules by mouth daily.        . pravastatin (PRAVACHOL) 40 MG tablet Take 1 tablet (40 mg total) by mouth daily.   90 tablet  3  . Tamsulosin HCl (FLOMAX) 0.4 MG CAPS Take 0.4 mg by mouth daily.        Marland Kitchen warfarin (COUMADIN) 4 MG tablet Take 4 mg by mouth daily.       No current facility-administered medications for this visit.    Allergies:  Allergies  Allergen Reactions  . Cefpodoxime Proxetil     Past Medical History, Surgical history, Social history, and Family History were reviewed and updated.  Review of Systems:  Remaining ROS negative. Physical Exam: Blood pressure 127/70, pulse 61, temperature 96.9 F (36.1 C), temperature source Oral, resp. rate 20, height 5' 8.28" (1.734 m), weight 159 lb 9.6 oz (72.394 kg). ECOG: 1 General appearance: alert Head: Normocephalic, without obvious abnormality, atraumatic Neck: no adenopathy, no carotid bruit, no JVD, supple, symmetrical, trachea midline and thyroid not enlarged, symmetric, no tenderness/mass/nodules Lymph nodes: Cervical, supraclavicular, and axillary nodes normal. Heart:regular rate and rhythm, S1, S2 normal, no murmur, click, rub or gallop Lung:chest clear, no wheezing, rales, normal symmetric air entry Abdomin: soft, non-tender, without masses or organomegaly EXT:no erythema, induration, or nodules   Lab Results: Lab Results  Component Value Date   WBC 1.3* 05/28/2013   HGB 15.8 05/28/2013   HCT 44.8 05/28/2013   MCV 92.2 05/28/2013   PLT 45* 05/28/2013     Chemistry  Component Value Date/Time   NA 138 12/13/2012 0959   NA 137 04/19/2012 1020   K 4.6 12/13/2012 0959   K 3.6 04/19/2012 1020   CL 106 12/13/2012 0959   CL 103 04/19/2012 1020   CO2 26 12/13/2012 0959   CO2 26 04/19/2012 1020   BUN 13.4 12/13/2012 0959   BUN 15 04/19/2012 1020   CREATININE 1.1 12/13/2012 0959   CREATININE 0.9 04/19/2012 1020      Component Value Date/Time   CALCIUM 9.1 12/13/2012 0959   CALCIUM 8.9 04/19/2012 1020   ALKPHOS 80 12/13/2012 0959   ALKPHOS 69 04/19/2012 1020   AST 25 12/13/2012 0959   AST 28 04/19/2012 1020   ALT 13 12/13/2012 0959    ALT 18 04/19/2012 1020   BILITOT 0.68 12/13/2012 0959   BILITOT 0.9 04/19/2012 1020     Impression and Plan:  This is a pleasant 79 year old gentleman with the following issues: 1. Thrombocytopenia and neutropenia, most likely autoimmune in nature vs MDS (although bone marrow biopsy was normal in 2010).  He had not had any recurrent sinopulmonary infection, had not had any bleeding, so the plan is to continue with observation at this time. Certainly, a trial of steroids would be needed if there is any evidence of any infection or bleeding.  We definitely could consider growth factor support such as Neupogen down the line. If he has further decline in his counts in the future, we can consider repeat bone marrow biopsy.  2. Rheumatoid arthritis, seems to be stable. 3. Followup will be in May of 2015.   Kevin Hose, MD 10/7/201412:07 PM

## 2013-06-07 ENCOUNTER — Ambulatory Visit (INDEPENDENT_AMBULATORY_CARE_PROVIDER_SITE_OTHER): Payer: Medicare Other | Admitting: *Deleted

## 2013-06-07 DIAGNOSIS — E538 Deficiency of other specified B group vitamins: Secondary | ICD-10-CM

## 2013-06-07 MED ORDER — CYANOCOBALAMIN 1000 MCG/ML IJ SOLN
1000.0000 ug | Freq: Once | INTRAMUSCULAR | Status: AC
  Administered 2013-06-07: 1000 ug via INTRAMUSCULAR

## 2013-07-08 ENCOUNTER — Ambulatory Visit (INDEPENDENT_AMBULATORY_CARE_PROVIDER_SITE_OTHER): Payer: Medicare Other | Admitting: *Deleted

## 2013-07-08 DIAGNOSIS — E538 Deficiency of other specified B group vitamins: Secondary | ICD-10-CM

## 2013-07-08 MED ORDER — CYANOCOBALAMIN 1000 MCG/ML IJ SOLN
1000.0000 ug | Freq: Once | INTRAMUSCULAR | Status: AC
  Administered 2013-07-08: 1000 ug via INTRAMUSCULAR

## 2013-08-07 ENCOUNTER — Ambulatory Visit (INDEPENDENT_AMBULATORY_CARE_PROVIDER_SITE_OTHER): Payer: Medicare Other | Admitting: *Deleted

## 2013-08-07 DIAGNOSIS — E538 Deficiency of other specified B group vitamins: Secondary | ICD-10-CM

## 2013-08-07 MED ORDER — CYANOCOBALAMIN 1000 MCG/ML IJ SOLN
1000.0000 ug | Freq: Once | INTRAMUSCULAR | Status: AC
  Administered 2013-08-07: 1000 ug via INTRAMUSCULAR

## 2013-09-09 ENCOUNTER — Ambulatory Visit (INDEPENDENT_AMBULATORY_CARE_PROVIDER_SITE_OTHER): Payer: Medicare Other | Admitting: *Deleted

## 2013-09-09 DIAGNOSIS — E538 Deficiency of other specified B group vitamins: Secondary | ICD-10-CM

## 2013-09-09 MED ORDER — CYANOCOBALAMIN 1000 MCG/ML IJ SOLN
1000.0000 ug | Freq: Once | INTRAMUSCULAR | Status: AC
  Administered 2013-09-09: 1000 ug via INTRAMUSCULAR

## 2013-10-10 ENCOUNTER — Ambulatory Visit (INDEPENDENT_AMBULATORY_CARE_PROVIDER_SITE_OTHER): Payer: Medicare Other | Admitting: *Deleted

## 2013-10-10 DIAGNOSIS — E538 Deficiency of other specified B group vitamins: Secondary | ICD-10-CM

## 2013-10-10 MED ORDER — CYANOCOBALAMIN 1000 MCG/ML IJ SOLN
1000.0000 ug | Freq: Once | INTRAMUSCULAR | Status: AC
  Administered 2013-10-10: 1000 ug via INTRAMUSCULAR

## 2013-10-16 ENCOUNTER — Other Ambulatory Visit: Payer: Self-pay | Admitting: Internal Medicine

## 2013-11-07 ENCOUNTER — Ambulatory Visit (INDEPENDENT_AMBULATORY_CARE_PROVIDER_SITE_OTHER): Payer: Medicare Other | Admitting: *Deleted

## 2013-11-07 DIAGNOSIS — E538 Deficiency of other specified B group vitamins: Secondary | ICD-10-CM

## 2013-11-07 MED ORDER — CYANOCOBALAMIN 1000 MCG/ML IJ SOLN
1000.0000 ug | Freq: Once | INTRAMUSCULAR | Status: AC
  Administered 2013-11-07: 1000 ug via INTRAMUSCULAR

## 2013-11-25 ENCOUNTER — Ambulatory Visit (INDEPENDENT_AMBULATORY_CARE_PROVIDER_SITE_OTHER): Payer: Medicare Other | Admitting: Internal Medicine

## 2013-11-25 ENCOUNTER — Encounter: Payer: Self-pay | Admitting: Internal Medicine

## 2013-11-25 VITALS — BP 130/80 | HR 66 | Temp 97.9°F | Resp 20 | Ht 68.5 in | Wt 159.0 lb

## 2013-11-25 DIAGNOSIS — I10 Essential (primary) hypertension: Secondary | ICD-10-CM

## 2013-11-25 DIAGNOSIS — D709 Neutropenia, unspecified: Secondary | ICD-10-CM

## 2013-11-25 DIAGNOSIS — I4891 Unspecified atrial fibrillation: Secondary | ICD-10-CM

## 2013-11-25 DIAGNOSIS — E785 Hyperlipidemia, unspecified: Secondary | ICD-10-CM

## 2013-11-25 DIAGNOSIS — I2510 Atherosclerotic heart disease of native coronary artery without angina pectoris: Secondary | ICD-10-CM

## 2013-11-25 DIAGNOSIS — M069 Rheumatoid arthritis, unspecified: Secondary | ICD-10-CM

## 2013-11-25 NOTE — Patient Instructions (Signed)
Limit your sodium (Salt) intake  Return in 6 months for follow-up  

## 2013-11-25 NOTE — Progress Notes (Signed)
Subjective:    Patient ID: Kevin Rubio, male    DOB: 1922-01-04, 80 y.o.   MRN: [id number]  HPI Wt Readings from Last 3 Encounters:  11/25/13 159 lb (72.122 kg)  05/28/13 159 lb 9.6 oz (72.394 kg)  05/27/13 158 lb (71.85 kg)   80 year old patient who is seen today for his biannual followup.  Medical problems include coronary artery disease.  He's had a recent cardiology followup.  He has a history of paroxysmal atrial fibrillation and remains on chronic Coumadin anticoagulation.  He has a history also of B12 deficiency, Ambien, mediated neutropenia, and history of rheumatoid arthritis.  He has done quite well.  No cardiac concerns.  Arthritis remains quiescent.  Past Medical History  Diagnosis Date  . ANEMIA, PERNICIOUS 05/14/2007  . Atrial fibrillation 08/08/2007  . BENIGN PROSTATIC HYPERTROPHY 03/05/2007  . CORONARY ARTERY DISEASE 03/05/2007  . DYSPLASTIC NEVUS 01/24/2008  . HYPERLIPIDEMIA 03/05/2007  . HYPERTENSION 03/05/2007  . MYOCARDIAL INFARCTION, HX OF 03/05/2007    1982  . NEUTROPENIA NOS 05/11/2007  . Rheumatoid arthritis(714.0) 03/05/2007  . VITAMIN B12 DEFICIENCY 08/08/2007  . Current use of long term anticoagulation   . Rheumatoid arthritis(714.0)   . Hernia     umbilical    History   Social History  . Marital Status: Married    Spouse Name: N/A    Number of Children: N/A  . Years of Education: N/A   Occupational History  . Not on file.   Social History Main Topics  . Smoking status: Former Smoker    Quit date: 08/22/1978  . Smokeless tobacco: Not on file  . Alcohol Use: No  . Drug Use: No  . Sexual Activity: Not on file   Other Topics Concern  . Not on file   Social History Narrative  . No narrative on file    Past Surgical History  Procedure Laterality Date  . Laparoscopic cholecystectomy  11/09/2001    Dr Lennie Hummer  . Nasal polyp surgery    . Ptca  15 yrs ago  . External ear surgery  2 years ago  . Laparoscopic inguinal hernia repair  01/27/11      right direct, Dr Greer Pickerel  . Hernia repair  2012    History reviewed. No pertinent family history.  Allergies  Allergen Reactions  . Cefpodoxime Proxetil     Current Outpatient Prescriptions on File Prior to Visit  Medication Sig Dispense Refill  . ascorbic Acid (VITAMIN C) 500 MG CPCR Take 500 mg by mouth daily.        . cyanocobalamin (,VITAMIN B-12,) 1000 MCG/ML injection Inject 1,000 mcg into the muscle every 30 (thirty) days.      . Ferrous Sulfate (IRON CR PO) Take 65 mg by mouth daily.        Marland Kitchen guaiFENesin (MUCINEX) 600 MG 12 hr tablet Take 600 mg by mouth daily.       . metoprolol tartrate (LOPRESSOR) 25 MG tablet 25 mg 3 (three) times daily. Two tablets in the morning, and one tablet in the afternoon       . Multiple Vitamins-Minerals (CENTRUM SILVER PO) Take by mouth daily.        . Omega-3 Fatty Acids (FISH OIL) 1000 MG CAPS Take 2 capsules by mouth daily.        . pravastatin (PRAVACHOL) 40 MG tablet TAKE ONE TABLET BY MOUTH ONCE DAILY  90 tablet  1  . Tamsulosin HCl (FLOMAX) 0.4 MG CAPS Take  0.4 mg by mouth daily.        Marland Kitchen warfarin (COUMADIN) 4 MG tablet Take 4 mg by mouth daily.       No current facility-administered medications on file prior to visit.    BP 130/80  Pulse 66  Temp(Src) 97.9 F (36.6 C) (Oral)  Resp 20  Ht 5' 8.5" (1.74 m)  Wt 159 lb (72.122 kg)  BMI 23.82 kg/m2  SpO2 95%     Review of Systems  Constitutional: Negative for fever, chills, appetite change and fatigue.  HENT: Negative for congestion, dental problem, ear pain, hearing loss, sore throat, tinnitus, trouble swallowing and voice change.   Eyes: Negative for pain, discharge and visual disturbance.  Respiratory: Negative for cough, chest tightness, wheezing and stridor.   Cardiovascular: Negative for chest pain, palpitations and leg swelling.  Gastrointestinal: Negative for nausea, vomiting, abdominal pain, diarrhea, constipation, blood in stool and abdominal distention.   Genitourinary: Negative for urgency, hematuria, flank pain, discharge, difficulty urinating and genital sores.  Musculoskeletal: Positive for gait problem. Negative for arthralgias, back pain, joint swelling, myalgias and neck stiffness.  Skin: Negative for rash.  Neurological: Negative for dizziness, syncope, speech difficulty, weakness, numbness and headaches.  Hematological: Negative for adenopathy. Does not bruise/bleed easily.  Psychiatric/Behavioral: Negative for behavioral problems and dysphoric mood. The patient is not nervous/anxious.        Objective:   Physical Exam  Constitutional: He is oriented to person, place, and time. He appears well-developed.  HENT:  Head: Normocephalic.  Right Ear: External ear normal.  Left Ear: External ear normal.  Eyes: Conjunctivae and EOM are normal.  Neck: Normal range of motion.  Cardiovascular: Normal rate and normal heart sounds.   Pulmonary/Chest: He has rales.  A few crackles, right base  Abdominal: Bowel sounds are normal.  Musculoskeletal: Normal range of motion. He exhibits no edema and no tenderness.  Neurological: He is alert and oriented to person, place, and time.  Psychiatric: He has a normal mood and affect. His behavior is normal.          Assessment & Plan:   Coronary artery disease.  Clinically stable Hypertension well controlled Atrial fibrillation.  Continue Coumadin anticoagulation because deficiency History of Felty's syndrome.  Hematology followup next month as scheduled  Recheck in 6 months or as needed

## 2013-11-25 NOTE — Progress Notes (Signed)
Pre-visit discussion using our clinic review tool. No additional management support is needed unless otherwise documented below in the visit note.  

## 2013-11-26 ENCOUNTER — Telehealth: Payer: Self-pay | Admitting: Internal Medicine

## 2013-11-26 NOTE — Telephone Encounter (Signed)
Relevant patient education mailed to patient.  

## 2013-12-09 ENCOUNTER — Ambulatory Visit (INDEPENDENT_AMBULATORY_CARE_PROVIDER_SITE_OTHER): Payer: Medicare Other | Admitting: *Deleted

## 2013-12-09 DIAGNOSIS — E538 Deficiency of other specified B group vitamins: Secondary | ICD-10-CM

## 2013-12-09 MED ORDER — CYANOCOBALAMIN 1000 MCG/ML IJ SOLN
1000.0000 ug | Freq: Once | INTRAMUSCULAR | Status: AC
  Administered 2013-12-09: 1000 ug via INTRAMUSCULAR

## 2013-12-26 ENCOUNTER — Ambulatory Visit (INDEPENDENT_AMBULATORY_CARE_PROVIDER_SITE_OTHER): Payer: Medicare Other | Admitting: Internal Medicine

## 2013-12-26 ENCOUNTER — Telehealth: Payer: Self-pay | Admitting: General Practice

## 2013-12-26 ENCOUNTER — Encounter: Payer: Self-pay | Admitting: Internal Medicine

## 2013-12-26 VITALS — BP 120/70 | HR 68 | Temp 97.8°F | Resp 20 | Ht 68.5 in | Wt 160.0 lb

## 2013-12-26 DIAGNOSIS — Z7901 Long term (current) use of anticoagulants: Secondary | ICD-10-CM

## 2013-12-26 DIAGNOSIS — T148XXA Other injury of unspecified body region, initial encounter: Secondary | ICD-10-CM

## 2013-12-26 DIAGNOSIS — I4891 Unspecified atrial fibrillation: Secondary | ICD-10-CM

## 2013-12-26 DIAGNOSIS — M0500 Felty's syndrome, unspecified site: Secondary | ICD-10-CM

## 2013-12-26 NOTE — Progress Notes (Signed)
Subjective:    Patient ID: Kevin Rubio, male    DOB: Sep 13, 1921, 80 y.o.   MRN: [id number]  HPI   80 year old patient who has a history of atrial fibrillation and has been on chronic Coumadin anticoagulation.  He also has a history of immune mediated thrombocytopenia and rheumatoid arthritis.  Complaints today include bruising and discomfort involving his right leg.  Past Medical History  Diagnosis Date  . ANEMIA, PERNICIOUS 05/14/2007  . Atrial fibrillation 08/08/2007  . BENIGN PROSTATIC HYPERTROPHY 03/05/2007  . CORONARY ARTERY DISEASE 03/05/2007  . DYSPLASTIC NEVUS 01/24/2008  . HYPERLIPIDEMIA 03/05/2007  . HYPERTENSION 03/05/2007  . MYOCARDIAL INFARCTION, HX OF 03/05/2007    1982  . NEUTROPENIA NOS 05/11/2007  . Rheumatoid arthritis(714.0) 03/05/2007  . VITAMIN B12 DEFICIENCY 08/08/2007  . Current use of long term anticoagulation   . Rheumatoid arthritis(714.0)   . Hernia     umbilical    History   Social History  . Marital Status: Married    Spouse Name: N/A    Number of Children: N/A  . Years of Education: N/A   Occupational History  . Not on file.   Social History Main Topics  . Smoking status: Former Smoker    Quit date: 08/22/1978  . Smokeless tobacco: Not on file  . Alcohol Use: No  . Drug Use: No  . Sexual Activity: Not on file   Other Topics Concern  . Not on file   Social History Narrative  . No narrative on file    Past Surgical History  Procedure Laterality Date  . Laparoscopic cholecystectomy  11/09/2001    Dr Lennie Hummer  . Nasal polyp surgery    . Ptca  15 yrs ago  . External ear surgery  2 years ago  . Laparoscopic inguinal hernia repair  01/27/11    right direct, Dr Greer Pickerel  . Hernia repair  2012    History reviewed. No pertinent family history.  Allergies  Allergen Reactions  . Cefpodoxime Proxetil     Current Outpatient Prescriptions on File Prior to Visit  Medication Sig Dispense Refill  . ascorbic Acid (VITAMIN C) 500 MG CPCR  Take 500 mg by mouth daily.        . cyanocobalamin (,VITAMIN B-12,) 1000 MCG/ML injection Inject 1,000 mcg into the muscle every 30 (thirty) days.      . Ferrous Sulfate (IRON CR PO) Take 65 mg by mouth daily.        Marland Kitchen guaiFENesin (MUCINEX) 600 MG 12 hr tablet Take 600 mg by mouth daily.       . metoprolol tartrate (LOPRESSOR) 25 MG tablet 25 mg 3 (three) times daily. Two tablets in the morning, and one tablet in the afternoon       . Multiple Vitamins-Minerals (CENTRUM SILVER PO) Take by mouth daily.        . Omega-3 Fatty Acids (FISH OIL) 1000 MG CAPS Take 2 capsules by mouth daily.        . pravastatin (PRAVACHOL) 40 MG tablet TAKE ONE TABLET BY MOUTH ONCE DAILY  90 tablet  1  . Tamsulosin HCl (FLOMAX) 0.4 MG CAPS Take 0.4 mg by mouth daily.        Marland Kitchen warfarin (COUMADIN) 4 MG tablet Take 4 mg by mouth daily.       No current facility-administered medications on file prior to visit.    BP 120/70  Pulse 68  Temp(Src) 97.8 F (36.6 C) (Oral)  Resp 20  Ht 5' 8.5" (1.74 m)  Wt 160 lb (72.576 kg)  BMI 23.97 kg/m2  SpO2 97%      Review of Systems  Skin: Positive for wound.       Objective:   Physical Exam  Constitutional: He appears well-nourished. No distress.  Musculoskeletal:  Ecchymosis involving the right lower leg from the popliteal area and also involving the calf, ankle and dorsal aspect of the foot Very mild fullness in discomfort involving the right calf          Assessment & Plan:   Calf hematoma in the setting of chr coumadin anticoagulation  Wound care discussed;  Will check INR

## 2013-12-26 NOTE — Patient Instructions (Signed)
Hematoma A hematoma is a collection of blood under the skin, in an organ, in a body space, in a joint space, or in other tissue. The blood can clot to form a lump that you can see and feel. The lump is often firm and may sometimes become sore and tender. Most hematomas get better in a few days to weeks. However, some hematomas may be serious and require medical care. Hematomas can range in size from very small to very large. CAUSES  A hematoma can be caused by a blunt or penetrating injury. It can also be caused by spontaneous leakage from a blood vessel under the skin. Spontaneous leakage from a blood vessel is more likely to occur in older people, especially those taking blood thinners. Sometimes, a hematoma can develop after certain medical procedures. SIGNS AND SYMPTOMS   A firm lump on the body.  Possible pain and tenderness in the area.  Bruising.Blue, dark blue, purple-red, or yellowish skin may appear at the site of the hematoma if the hematoma is close to the surface of the skin. For hematomas in deeper tissues or body spaces, the signs and symptoms may be subtle. For example, an intra-abdominal hematoma may cause abdominal pain, weakness, fainting, and shortness of breath. An intracranial hematoma may cause a headache or symptoms such as weakness, trouble speaking, or a change in consciousness. DIAGNOSIS  A hematoma can usually be diagnosed based on your medical history and a physical exam. Imaging tests may be needed if your health care provider suspects a hematoma in deeper tissues or body spaces, such as the abdomen, head, or chest. These tests may include ultrasonography or a CT scan.  TREATMENT  Hematomas usually go away on their own over time. Rarely does the blood need to be drained out of the body. Large hematomas or those that may affect vital organs will sometimes need surgical drainage or monitoring. HOME CARE INSTRUCTIONS   Apply ice to the injured area:   Put ice in a  plastic bag.   Place a towel between your skin and the bag.   Leave the ice on for 20 minutes, 2 3 times a day for the first 1 to 2 days.   After the first 2 days, switch to using warm compresses on the hematoma.   Elevate the injured area to help decrease pain and swelling. Wrapping the area with an elastic bandage may also be helpful. Compression helps to reduce swelling and promotes shrinking of the hematoma. Make sure the bandage is not wrapped too tight.   If your hematoma is on a lower extremity and is painful, crutches may be helpful for a couple days.   Only take over-the-counter or prescription medicines as directed by your health care provider. SEEK IMMEDIATE MEDICAL CARE IF:   You have increasing pain, or your pain is not controlled with medicine.   You have a fever.   You have worsening swelling or discoloration.   Your skin over the hematoma breaks or starts bleeding.   Your hematoma is in your chest or abdomen and you have weakness, shortness of breath, or a change in consciousness.  Your hematoma is on your scalp (caused by a fall or injury) and you have a worsening headache or a change in alertness or consciousness. MAKE SURE YOU:   Understand these instructions.  Will watch your condition.  Will get help right away if you are not doing well or get worse. Document Released: 03/22/2004 Document Revised: 04/10/2013 Document Reviewed:   01/16/2013 [employer] Patient Information 2014 [employer]  

## 2013-12-26 NOTE — Telephone Encounter (Signed)
Message copied by Warden Fillers on Thu Dec 26, 2013  3:19 PM ------      Message from: Marletta Lor      Created: Thu Dec 26, 2013  3:10 PM       Patient followed by Dr Wynonia Lawman      ----- Message -----         From: Warden Fillers, RN         Sent: 12/26/2013   1:38 PM           To: Marletta Lor, MD            Hi Dr. Raliegh Ip.            Patient's INR is 2.2 today.  Do you know who is following patient for coumadin?  I don't see any coag visits in epic.            Thanks,      Jenny Reichmann       ------

## 2013-12-26 NOTE — Progress Notes (Signed)
Pre-visit discussion using our clinic review tool. No additional management support is needed unless otherwise documented below in the visit note.  

## 2014-01-06 ENCOUNTER — Ambulatory Visit (INDEPENDENT_AMBULATORY_CARE_PROVIDER_SITE_OTHER): Payer: Medicare Other | Admitting: *Deleted

## 2014-01-06 DIAGNOSIS — E538 Deficiency of other specified B group vitamins: Secondary | ICD-10-CM

## 2014-01-06 MED ORDER — CYANOCOBALAMIN 1000 MCG/ML IJ SOLN
1000.0000 ug | Freq: Once | INTRAMUSCULAR | Status: AC
  Administered 2014-01-06: 1000 ug via INTRAMUSCULAR

## 2014-01-07 ENCOUNTER — Ambulatory Visit: Payer: Medicare Other | Admitting: *Deleted

## 2014-01-08 ENCOUNTER — Encounter: Payer: Self-pay | Admitting: Oncology

## 2014-01-08 ENCOUNTER — Other Ambulatory Visit (HOSPITAL_BASED_OUTPATIENT_CLINIC_OR_DEPARTMENT_OTHER): Payer: Medicare Other

## 2014-01-08 ENCOUNTER — Telehealth: Payer: Self-pay | Admitting: Oncology

## 2014-01-08 ENCOUNTER — Ambulatory Visit (HOSPITAL_BASED_OUTPATIENT_CLINIC_OR_DEPARTMENT_OTHER): Payer: Medicare Other | Admitting: Oncology

## 2014-01-08 VITALS — BP 129/53 | HR 64 | Temp 97.7°F | Resp 20 | Ht 68.5 in | Wt 158.2 lb

## 2014-01-08 DIAGNOSIS — M069 Rheumatoid arthritis, unspecified: Secondary | ICD-10-CM

## 2014-01-08 DIAGNOSIS — D709 Neutropenia, unspecified: Secondary | ICD-10-CM

## 2014-01-08 DIAGNOSIS — D696 Thrombocytopenia, unspecified: Secondary | ICD-10-CM

## 2014-01-08 LAB — COMPREHENSIVE METABOLIC PANEL (CC13)
ALT: 15 U/L (ref 0–55)
AST: 26 U/L (ref 5–34)
Albumin: 3.3 g/dL — ABNORMAL LOW (ref 3.5–5.0)
Alkaline Phosphatase: 80 U/L (ref 40–150)
Anion Gap: 9 mEq/L (ref 3–11)
BUN: 15.3 mg/dL (ref 7.0–26.0)
CHLORIDE: 106 meq/L (ref 98–109)
CO2: 23 mEq/L (ref 22–29)
CREATININE: 1.1 mg/dL (ref 0.7–1.3)
Calcium: 9 mg/dL (ref 8.4–10.4)
Glucose: 146 mg/dl — ABNORMAL HIGH (ref 70–140)
Potassium: 4.2 mEq/L (ref 3.5–5.1)
Sodium: 138 mEq/L (ref 136–145)
Total Bilirubin: 0.9 mg/dL (ref 0.20–1.20)
Total Protein: 7.9 g/dL (ref 6.4–8.3)

## 2014-01-08 LAB — CBC WITH DIFFERENTIAL/PLATELET
BASO%: 1.1 % (ref 0.0–2.0)
Basophils Absolute: 0 10*3/uL (ref 0.0–0.1)
EOS%: 1.1 % (ref 0.0–7.0)
Eosinophils Absolute: 0 10*3/uL (ref 0.0–0.5)
HCT: 42.7 % (ref 38.4–49.9)
HGB: 14.8 g/dL (ref 13.0–17.1)
LYMPH#: 0.3 10*3/uL — AB (ref 0.9–3.3)
LYMPH%: 31.9 % (ref 14.0–49.0)
MCH: 31.8 pg (ref 27.2–33.4)
MCHC: 34.7 g/dL (ref 32.0–36.0)
MCV: 91.6 fL (ref 79.3–98.0)
MONO#: 0.2 10*3/uL (ref 0.1–0.9)
MONO%: 20.9 % — ABNORMAL HIGH (ref 0.0–14.0)
NEUT#: 0.4 10*3/uL — CL (ref 1.5–6.5)
NEUT%: 45 % (ref 39.0–75.0)
Platelets: 41 10*3/uL — ABNORMAL LOW (ref 140–400)
RBC: 4.66 10*6/uL (ref 4.20–5.82)
RDW: 15.2 % — ABNORMAL HIGH (ref 11.0–14.6)
WBC: 0.9 10*3/uL — CL (ref 4.0–10.3)

## 2014-01-08 NOTE — Progress Notes (Signed)
Hematology and Oncology Follow Up Visit  Kevin Rubio [id number] October 21, 1921 80 y.o. 01/08/2014 10:37 AM  CC: Kevin Lor, MD  Kevin Rubio, M.D.  Kevin Rubio, M.D.    Principle Diagnosis: This is an 80 year old gentleman with neutropenia and thrombocytopenia, most likely autoimmune in nature related to rheumatoid arthritis.  Current therapy: Observation and surveillance. He is S/P bone marrow biopsy done in 06/2009 which did not show any abnormalities.  Interim History:  Kevin Rubio presents today for a followup visit by himself. He reports no issues since his last visit. He has continued to really have no complications related to his neutropenia and thrombocytopenia.  He continues to live independently with his wife. He had not had any fevers, had not had any chills, had not had any opportunistic infection or hospitalization.  He had not had any epistaxis.  His performance status and activity level remain at reasonable range.  His arthritis has been under reasonable control. He does not report any headaches blurred vision or seizures. Has not reported any chest pain shortness of breath or difficulty breathing. Has not reported any cough or hemoptysis. Has not reported any nausea or vomiting or abdominal pain. Has not reported any frequency urgency or hesitancy. Rest or view of system is unremarkable.  Medications: I have reviewed the patient's current medications.   Current Outpatient Prescriptions  Medication Sig Dispense Refill  . ascorbic Acid (VITAMIN C) 500 MG CPCR Take 500 mg by mouth daily.        . cyanocobalamin (,VITAMIN B-12,) 1000 MCG/ML injection Inject 1,000 mcg into the muscle every 30 (thirty) days.      . Ferrous Sulfate (IRON CR PO) Take 65 mg by mouth daily.        Marland Kitchen guaiFENesin (MUCINEX) 600 MG 12 hr tablet Take 600 mg by mouth daily.       . metoprolol tartrate (LOPRESSOR) 25 MG tablet 25 mg 3 (three) times daily. Two tablets in the morning, and one  tablet in the afternoon       . Multiple Vitamins-Minerals (CENTRUM SILVER PO) Take by mouth daily.        . Omega-3 Fatty Acids (FISH OIL) 1000 MG CAPS Take 2 capsules by mouth daily.        . pravastatin (PRAVACHOL) 40 MG tablet TAKE ONE TABLET BY MOUTH ONCE DAILY  90 tablet  1  . Tamsulosin HCl (FLOMAX) 0.4 MG CAPS Take 0.4 mg by mouth daily.        Marland Kitchen warfarin (COUMADIN) 4 MG tablet Take 4 mg by mouth daily.       No current facility-administered medications for this visit.    Allergies:  Allergies  Allergen Reactions  . Cefpodoxime Proxetil     Past Medical History, Surgical history, Social history, and Family History were reviewed and updated.   Physical Exam: Blood pressure 129/53, pulse 64, temperature 97.7 F (36.5 C), temperature source Oral, resp. rate 20, height 5' 8.5" (1.74 m), weight 158 lb 3.2 oz (71.759 kg). ECOG: 1 General appearance: alert awake not in any distress. Head: Normocephalic, without obvious abnormality, atraumatic Neck: no adenopathy, no thyroid masses. Lymph nodes: Cervical, supraclavicular, and axillary nodes normal. Heart:regular rate and rhythm, S1, S2 normal, no murmur, click, rub or gallop Lung:chest clear, no wheezing, rales, normal symmetric air entry Abdomin: soft, non-tender, without masses or organomegaly EXT:no erythema, induration, or nodules   Lab Results: Lab Results  Component Value Date   WBC 0.9* 01/08/2014  HGB 14.8 01/08/2014   HCT 42.7 01/08/2014   MCV 91.6 01/08/2014   PLT 41* 01/08/2014     Chemistry      Component Value Date/Time   NA 139 05/28/2013 1137   NA 137 04/19/2012 1020   K 4.8 05/28/2013 1137   K 3.6 04/19/2012 1020   CL 106 12/13/2012 0959   CL 103 04/19/2012 1020   CO2 27 05/28/2013 1137   CO2 26 04/19/2012 1020   BUN 18.6 05/28/2013 1137   BUN 15 04/19/2012 1020   CREATININE 1.0 05/28/2013 1137   CREATININE 0.9 04/19/2012 1020      Component Value Date/Time   CALCIUM 9.1 05/28/2013 1137   CALCIUM 8.9  04/19/2012 1020   ALKPHOS 75 05/28/2013 1137   ALKPHOS 69 04/19/2012 1020   AST 28 05/28/2013 1137   AST 28 04/19/2012 1020   ALT 16 05/28/2013 1137   ALT 18 04/19/2012 1020   BILITOT 1.18 05/28/2013 1137   BILITOT 0.9 04/19/2012 1020     Impression and Plan:  This is a pleasant 80 year old gentleman with the following issues: 1. Thrombocytopenia and neutropenia, most likely autoimmune in nature vs MDS (although bone marrow biopsy was normal in 2010).  His counts are slightly lower and the last few years. He had not had any recurrent sinopulmonary infection, had not had any bleeding, so the plan is to continue with observation at this time. Certainly, a trial of steroids would be needed if there is any evidence of any infection or bleeding.  We definitely could consider growth factor support such as Neupogen down the line if he develops any recurrent infections. 2. Rheumatoid arthritis, seems to be stable. 3. Followup will be in 06/2014.    Kevin Portela, MD 5/20/201510:37 AM

## 2014-01-08 NOTE — Telephone Encounter (Signed)
Gave pt appt for lab and MD for november 2015

## 2014-02-07 ENCOUNTER — Ambulatory Visit (INDEPENDENT_AMBULATORY_CARE_PROVIDER_SITE_OTHER): Payer: Medicare Other | Admitting: *Deleted

## 2014-02-07 DIAGNOSIS — E538 Deficiency of other specified B group vitamins: Secondary | ICD-10-CM

## 2014-02-07 MED ORDER — CYANOCOBALAMIN 1000 MCG/ML IJ SOLN
1000.0000 ug | Freq: Once | INTRAMUSCULAR | Status: AC
  Administered 2014-02-07: 1000 ug via INTRAMUSCULAR

## 2014-03-10 ENCOUNTER — Ambulatory Visit (INDEPENDENT_AMBULATORY_CARE_PROVIDER_SITE_OTHER): Payer: Medicare Other | Admitting: *Deleted

## 2014-03-10 DIAGNOSIS — E538 Deficiency of other specified B group vitamins: Secondary | ICD-10-CM

## 2014-03-10 MED ORDER — CYANOCOBALAMIN 1000 MCG/ML IJ SOLN
1000.0000 ug | Freq: Once | INTRAMUSCULAR | Status: AC
  Administered 2014-03-10: 1000 ug via INTRAMUSCULAR

## 2014-04-10 ENCOUNTER — Ambulatory Visit (INDEPENDENT_AMBULATORY_CARE_PROVIDER_SITE_OTHER): Payer: Medicare Other | Admitting: *Deleted

## 2014-04-10 DIAGNOSIS — E538 Deficiency of other specified B group vitamins: Secondary | ICD-10-CM

## 2014-04-10 MED ORDER — CYANOCOBALAMIN 1000 MCG/ML IJ SOLN
1000.0000 ug | Freq: Once | INTRAMUSCULAR | Status: AC
  Administered 2014-04-10: 1000 ug via INTRAMUSCULAR

## 2014-04-18 ENCOUNTER — Other Ambulatory Visit: Payer: Self-pay | Admitting: Internal Medicine

## 2014-05-12 ENCOUNTER — Ambulatory Visit (INDEPENDENT_AMBULATORY_CARE_PROVIDER_SITE_OTHER): Payer: Medicare Other | Admitting: *Deleted

## 2014-05-12 DIAGNOSIS — E538 Deficiency of other specified B group vitamins: Secondary | ICD-10-CM

## 2014-05-12 MED ORDER — CYANOCOBALAMIN 1000 MCG/ML IJ SOLN
1000.0000 ug | Freq: Once | INTRAMUSCULAR | Status: AC
  Administered 2014-05-12: 1000 ug via INTRAMUSCULAR

## 2014-06-05 ENCOUNTER — Ambulatory Visit (INDEPENDENT_AMBULATORY_CARE_PROVIDER_SITE_OTHER): Payer: Medicare Other | Admitting: Internal Medicine

## 2014-06-05 ENCOUNTER — Encounter: Payer: Self-pay | Admitting: Internal Medicine

## 2014-06-05 ENCOUNTER — Other Ambulatory Visit: Payer: Self-pay | Admitting: *Deleted

## 2014-06-05 VITALS — BP 118/70 | HR 60 | Temp 97.6°F | Resp 20 | Ht 68.0 in | Wt 152.0 lb

## 2014-06-05 DIAGNOSIS — I252 Old myocardial infarction: Secondary | ICD-10-CM

## 2014-06-05 DIAGNOSIS — I10 Essential (primary) hypertension: Secondary | ICD-10-CM

## 2014-06-05 DIAGNOSIS — Z0000 Encounter for general adult medical examination without abnormal findings: Secondary | ICD-10-CM

## 2014-06-05 DIAGNOSIS — R7989 Other specified abnormal findings of blood chemistry: Secondary | ICD-10-CM

## 2014-06-05 DIAGNOSIS — M069 Rheumatoid arthritis, unspecified: Secondary | ICD-10-CM

## 2014-06-05 DIAGNOSIS — Z7901 Long term (current) use of anticoagulants: Secondary | ICD-10-CM

## 2014-06-05 DIAGNOSIS — I482 Chronic atrial fibrillation, unspecified: Secondary | ICD-10-CM

## 2014-06-05 DIAGNOSIS — E538 Deficiency of other specified B group vitamins: Secondary | ICD-10-CM

## 2014-06-05 DIAGNOSIS — Z23 Encounter for immunization: Secondary | ICD-10-CM

## 2014-06-05 DIAGNOSIS — R7302 Impaired glucose tolerance (oral): Secondary | ICD-10-CM

## 2014-06-05 LAB — COMPREHENSIVE METABOLIC PANEL
ALBUMIN: 3.2 g/dL — AB (ref 3.5–5.2)
ALT: 18 U/L (ref 0–53)
AST: 32 U/L (ref 0–37)
Alkaline Phosphatase: 80 U/L (ref 39–117)
BUN: 23 mg/dL (ref 6–23)
CHLORIDE: 103 meq/L (ref 96–112)
CO2: 25 mEq/L (ref 19–32)
Calcium: 8.9 mg/dL (ref 8.4–10.5)
Creatinine, Ser: 1.3 mg/dL (ref 0.4–1.5)
GFR: 56.59 mL/min — ABNORMAL LOW (ref >60.00)
Glucose, Bld: 103 mg/dL — ABNORMAL HIGH (ref 70–99)
Potassium: 3.6 mEq/L (ref 3.5–5.1)
Sodium: 137 mEq/L (ref 135–145)
Total Bilirubin: 1.1 mg/dL (ref 0.2–1.2)
Total Protein: 8.7 g/dL — ABNORMAL HIGH (ref 6.0–8.3)

## 2014-06-05 LAB — CBC WITH DIFFERENTIAL/PLATELET
Basophils Absolute: 0 10*3/uL (ref 0.0–0.1)
Basophils Relative: 0.2 % (ref 0.0–3.0)
EOS ABS: 0 10*3/uL (ref 0.0–0.7)
EOS PCT: 0.8 % (ref 0.0–5.0)
HEMATOCRIT: 48.2 % (ref 39.0–52.0)
Hemoglobin: 16.2 g/dL (ref 13.0–17.0)
LYMPHS ABS: 0.3 10*3/uL — AB (ref 0.7–4.0)
Lymphocytes Relative: 28.3 % (ref 12.0–46.0)
MCHC: 33.6 g/dL (ref 30.0–36.0)
MCV: 94.1 fl (ref 78.0–100.0)
MONO ABS: 0.3 10*3/uL (ref 0.1–1.0)
Monocytes Relative: 25 % — ABNORMAL HIGH (ref 3.0–12.0)
Neutro Abs: 0.5 10*3/uL — ABNORMAL LOW (ref 1.4–7.7)
Neutrophils Relative %: 45.7 % (ref 43.0–77.0)
RBC: 5.11 Mil/uL (ref 4.22–5.81)
RDW: 15.5 % (ref 11.5–15.5)
WBC: 1.2 10*3/uL — CL (ref 4.0–10.5)

## 2014-06-05 LAB — HEMOGLOBIN A1C: Hgb A1c MFr Bld: 4.6 % (ref 4.6–6.5)

## 2014-06-05 LAB — TSH: TSH: 11.97 u[IU]/mL — AB (ref 0.35–4.50)

## 2014-06-05 MED ORDER — LEVOTHYROXINE SODIUM 25 MCG PO TABS: 25.0000 ug | ORAL_TABLET | Freq: Every day | ORAL | Status: DC

## 2014-06-05 NOTE — Patient Instructions (Addendum)
Limit your sodium (Salt) intake  Cardiology and oncology followup  Continue monthly INR and B12 injections  Return in one year for follow-up Health Maintenance A healthy lifestyle and preventative care can promote health and wellness.  Maintain regular health, dental, and eye exams.  Eat a healthy diet. Foods like vegetables, fruits, whole grains, low-fat dairy products, and lean protein foods contain the nutrients you need and are low in calories. Decrease your intake of foods high in solid fats, added sugars, and salt. Get information about a proper diet from your health care provider, if necessary.  Regular physical exercise is one of the most important things you can do for your health. Most adults should get at least 150 minutes of moderate-intensity exercise (any activity that increases your heart rate and causes you to sweat) each week. In addition, most adults need muscle-strengthening exercises on 2 or more days a week.   Maintain a healthy weight. The body mass index (BMI) is a screening tool to identify possible weight problems. It provides an estimate of body fat based on height and weight. Your health care provider can find your BMI and can help you achieve or maintain a healthy weight. For males 20 years and older:  A BMI below 18.5 is considered underweight.  A BMI of 18.5 to 24.9 is normal.  A BMI of 25 to 29.9 is considered overweight.  A BMI of 30 and above is considered obese.  Maintain normal blood lipids and cholesterol by exercising and minimizing your intake of saturated fat. Eat a balanced diet with plenty of fruits and vegetables. Blood tests for lipids and cholesterol should begin at age 38 and be repeated every 5 years. If your lipid or cholesterol levels are high, you are over age 75, or you are at high risk for heart disease, you may need your cholesterol levels checked more frequently.Ongoing high lipid and cholesterol levels should be treated with medicines  if diet and exercise are not working.  If you smoke, find out from your health care provider how to quit. If you do not use tobacco, do not start.  Lung cancer screening is recommended for adults aged 12-80 years who are at high risk for developing lung cancer because of a history of smoking. A yearly low-dose CT scan of the lungs is recommended for people who have at least a 30-pack-year history of smoking and are current smokers or have quit within the past 15 years. A pack year of smoking is smoking an average of 1 pack of cigarettes a day for 1 year (for example, a 30-pack-year history of smoking could mean smoking 1 pack a day for 30 years or 2 packs a day for 15 years). Yearly screening should continue until the smoker has stopped smoking for at least 15 years. Yearly screening should be stopped for people who develop a health problem that would prevent them from having lung cancer treatment.  If you choose to drink alcohol, do not have more than 2 drinks per day. One drink is considered to be 12 oz (360 mL) of beer, 5 oz (150 mL) of wine, or 1.5 oz (45 mL) of liquor.  Avoid the use of street drugs. Do not share needles with anyone. Ask for help if you need support or instructions about stopping the use of drugs.  High blood pressure causes heart disease and increases the risk of stroke. Blood pressure should be checked at least every 1-2 years. Ongoing high blood pressure should be  treated with medicines if weight loss and exercise are not effective.  If you are 36-37 years old, ask your health care provider if you should take aspirin to prevent heart disease.  Diabetes screening involves taking a blood sample to check your fasting blood sugar level. This should be done once every 3 years after age 57 if you are at a normal weight and without risk factors for diabetes. Testing should be considered at a younger age or be carried out more frequently if you are overweight and have at least 1 risk  factor for diabetes.  Colorectal cancer can be detected and often prevented. Most routine colorectal cancer screening begins at the age of 33 and continues through age 62. However, your health care provider may recommend screening at an earlier age if you have risk factors for colon cancer. On a yearly basis, your health care provider may provide home test kits to check for hidden blood in the stool. A small camera at the end of a tube may be used to directly examine the colon (sigmoidoscopy or colonoscopy) to detect the earliest forms of colorectal cancer. Talk to your health care provider about this at age 68 when routine screening begins. A direct exam of the colon should be repeated every 5-10 years through age 68, unless early forms of precancerous polyps or small growths are found.  People who are at an increased risk for hepatitis B should be screened for this virus. You are considered at high risk for hepatitis B if:  You were born in a country where hepatitis B occurs often. Talk with your health care provider about which countries are considered high risk.  Your parents were born in a high-risk country and you have not received a shot to protect against hepatitis B (hepatitis B vaccine).  You have HIV or AIDS.  You use needles to inject street drugs.  You live with, or have sex with, someone who has hepatitis B.  You are a man who has sex with other men (MSM).  You get hemodialysis treatment.  You take certain medicines for conditions like cancer, organ transplantation, and autoimmune conditions.  Hepatitis C blood testing is recommended for all people born from [birth year] through 1963 and any individual with known risk factors for hepatitis C.  Healthy men should no longer receive prostate-specific antigen (PSA) blood tests as part of routine cancer screening. Talk to your health care provider about prostate cancer screening.  Testicular cancer screening is not recommended for  adolescents or adult males who have no symptoms. Screening includes self-exam, a health care provider exam, and other screening tests. Consult with your health care provider about any symptoms you have or any concerns you have about testicular cancer.  Practice safe sex. Use condoms and avoid high-risk sexual practices to reduce the spread of sexually transmitted infections (STIs).  You should be screened for STIs, including gonorrhea and chlamydia if:  You are sexually active and are younger than 24 years.  You are older than 24 years, and your health care provider tells you that you are at risk for this type of infection.  Your sexual activity has changed since you were last screened, and you are at an increased risk for chlamydia or gonorrhea. Ask your health care provider if you are at risk.  If you are at risk of being infected with HIV, it is recommended that you take a prescription medicine daily to prevent HIV infection. This is called pre-exposure prophylaxis (PrEP). You  are considered at risk if:  You are a man who has sex with other men (MSM).  You are a heterosexual man who is sexually active with multiple partners.  You take drugs by injection.  You are sexually active with a partner who has HIV.  Talk with your health care provider about whether you are at high risk of being infected with HIV. If you choose to begin PrEP, you should first be tested for HIV. You should then be tested every 3 months for as long as you are taking PrEP.  Use sunscreen. Apply sunscreen liberally and repeatedly throughout the day. You should seek shade when your shadow is shorter than you. Protect yourself by wearing long sleeves, pants, a wide-brimmed hat, and sunglasses year round whenever you are outdoors.  Tell your health care provider of new moles or changes in moles, especially if there is a change in shape or color. Also, tell your health care provider if a mole is larger than the size of a  pencil eraser.  A one-time screening for abdominal aortic aneurysm (AAA) and surgical repair of large AAAs by ultrasound is recommended for men aged 12-75 years who are current or former smokers.  Stay current with your vaccines (immunizations). Document Released: 02/04/2008 Document Revised: 08/13/2013 Document Reviewed: 01/03/2011 [facility] Patient Information 2015 [place], [place]. This information is not intended to replace advice given to you by your health care provider. Make sure you discuss any questions you have with your health care provider.

## 2014-06-05 NOTE — Progress Notes (Signed)
Patient ID: Kevin Rubio, male   DOB: 30-Oct-1921, 80 y.o.   MRN: [id number]  Subjective:    Patient ID: Kevin Rubio, male    DOB: [birth year], 80 y.o.   MRN: [id number]  HPI 80 year-old patient who is seen today for a wellness exam.  He has atrial fibrillation and is followed by cardiology. 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   has monthly INRs.  In general doing quite well.  His only complaint is occasional constipation alternating with diarrhea  Past Medical History  Diagnosis Date  . ANEMIA, PERNICIOUS 05/14/2007  . Atrial fibrillation 08/08/2007  . BENIGN PROSTATIC HYPERTROPHY 03/05/2007  . CORONARY ARTERY DISEASE 03/05/2007  . DYSPLASTIC NEVUS 01/24/2008  . HYPERLIPIDEMIA 03/05/2007  . HYPERTENSION 03/05/2007  . MYOCARDIAL INFARCTION, HX OF 03/05/2007    1982  . NEUTROPENIA NOS 05/11/2007  . Rheumatoid arthritis(714.0) 03/05/2007  . VITAMIN B12 DEFICIENCY 08/08/2007  . Current use of long term anticoagulation   . Rheumatoid arthritis(714.0)   . Hernia     umbilical    History   Social History  . Marital Status: Married    Spouse Name: N/A    Number of Children: N/A  . Years of Education: N/A   Occupational History  . Not on file.   Social History Main Topics  . Smoking status: Former Smoker    Quit date: 08/22/1978  . Smokeless tobacco: Not on file  . Alcohol Use: No  . Drug Use: No  . Sexual Activity: Not on file   Other Topics Concern  . Not on file   Social History Narrative  . No narrative on file    Past Surgical History  Procedure Laterality Date  . Laparoscopic cholecystectomy  11/09/2001    Dr Lennie Hummer  . Nasal polyp surgery    . Ptca  15 yrs ago  . External ear surgery  2 years ago  . Laparoscopic inguinal hernia repair  01/27/11    right direct, Dr Greer Pickerel  . Hernia repair  2012    History reviewed. No pertinent family history.  Allergies  Allergen Reactions  . Cefpodoxime Proxetil     Current  Outpatient Prescriptions on File Prior to Visit  Medication Sig Dispense Refill  . ascorbic Acid (VITAMIN C) 500 MG CPCR Take 500 mg by mouth daily.        . cyanocobalamin (,VITAMIN B-12,) 1000 MCG/ML injection Inject 1,000 mcg into the muscle every 30 (thirty) days.      . Ferrous Sulfate (IRON CR PO) Take 65 mg by mouth daily.        Marland Kitchen guaiFENesin (MUCINEX) 600 MG 12 hr tablet Take 600 mg by mouth daily.       . metoprolol tartrate (LOPRESSOR) 25 MG tablet 25 mg 3 (three) times daily. Two tablets in the morning, and one tablet in the afternoon       . Multiple Vitamins-Minerals (CENTRUM SILVER PO) Take by mouth daily.        . Omega-3 Fatty Acids (FISH OIL) 1000 MG CAPS Take 2 capsules by mouth daily.        . pravastatin (PRAVACHOL) 40 MG tablet TAKE ONE TABLET BY MOUTH ONCE DAILY  90 tablet  1  . Tamsulosin HCl (FLOMAX) 0.4 MG CAPS Take 0.4 mg by mouth daily.        Marland Kitchen warfarin (COUMADIN) 4 MG tablet Take 4 mg by mouth daily.  No current facility-administered medications on file prior to visit.    BP 118/70  Pulse 60  Temp(Src) 97.6 F (36.4 C) (Oral)  Resp 20  Ht 5\' 8"  (1.727 m)  Wt 152 lb (68.947 kg)  BMI 23.12 kg/m2  Medicare Wellness:  1. Risk factors, based on past  M,S,F history-  patient has known coronary artery disease as well as dyslipidemia he has chronic atrial fibrillation  2.  Physical activities: Remains fairly active in spite of his age and rheumatoid arthritis  3.  Depression/mood: No history of depression or mood disorder  4.  Hearing: Moderately severe impairment. Uses hearing aids  5.  ADL's: Independent in all aspects of daily living  6.  Fall risk: Moderate due to age and arthritis  7.  Home safety: No problems identified  8.  Height weight, and visual acuity;  9.  Counseling: Continue monthly INRs active lifestyle and restricted sodium diet  10. Lab orders based on risk factors: Has had a recent INR. Has had recent lab data to his inguinal  hernia repair 44. Referral : Followup cardiology  12. Care plan: Continue present medical regimen 13. Cognitive assessment: Alert and oriented normal affect. No cognitive dysfunction 14.  Preventive services include annual health assessment and exam with screening lab.  Patient was provided with a written and personalized care plan 15.  Provider list updated.  Includes cardiology primary care and oncology, as well as ophthalmology         Review of Systems  Constitutional: Positive for fatigue. Negative for fever, chills, activity change and appetite change.  HENT: Negative for congestion, dental problem, ear pain, hearing loss, mouth sores, rhinorrhea, sinus pressure, sneezing, tinnitus, trouble swallowing and voice change.   Eyes: Negative for photophobia, pain, redness and visual disturbance.  Respiratory: Negative for apnea, cough, choking, chest tightness, shortness of breath and wheezing.   Cardiovascular: Positive for palpitations. Negative for chest pain and leg swelling.  Gastrointestinal: Negative for nausea, vomiting, abdominal pain, diarrhea, constipation, blood in stool, abdominal distention, anal bleeding and rectal pain.  Genitourinary: Negative for dysuria, urgency, frequency, hematuria, flank pain, decreased urine volume, discharge, penile swelling, scrotal swelling, difficulty urinating, genital sores and testicular pain.  Musculoskeletal: Negative for arthralgias, back pain, gait problem, joint swelling, myalgias, neck pain and neck stiffness.  Skin: Negative for color change, rash and wound.  Neurological: Positive for weakness. Negative for dizziness, tremors, seizures, syncope, facial asymmetry, speech difficulty, light-headedness, numbness and headaches.  Hematological: Negative for adenopathy. Bruises/bleeds easily.  Psychiatric/Behavioral: Negative for suicidal ideas, hallucinations, behavioral problems, confusion, sleep disturbance, self-injury, dysphoric mood,  decreased concentration and agitation. The patient is not nervous/anxious.        Objective:   Physical Exam  Constitutional: He appears well-developed and well-nourished.  HENT:  Head: Normocephalic and atraumatic.  Right Ear: External ear normal.  Left Ear: External ear normal.  Nose: Nose normal.  Mouth/Throat: Oropharynx is clear and moist.  Eyes: Conjunctivae and EOM are normal. Pupils are equal, round, and reactive to light. No scleral icterus.  Neck: Normal range of motion. Neck supple. No JVD present. No thyromegaly present.  Cardiovascular: Normal rate and normal heart sounds.  Exam reveals no gallop and no friction rub.   No murmur heard. Dorsalis pedis pulses are intact;  the posterior tibial pulses are faint   Irregular rhythm with a controlled ventricular response  Pulmonary/Chest: Effort normal and breath sounds normal. He exhibits no tenderness.  Abdominal: Soft. Bowel sounds are normal. He  exhibits no distension and no mass. There is no tenderness.  Genitourinary: Penis normal. Guaiac negative stool.  Prostate enlargement with  prominent left lobe  Musculoskeletal: Normal range of motion. He exhibits no edema and no tenderness.  Joint deformities of RA with ulnar deviation  Lymphadenopathy:    He has no cervical adenopathy.  Neurological: He is alert. He has normal reflexes. No cranial nerve deficit. Coordination normal.  Decreased vibratory sensation distally  Skin: Skin is warm and dry. No rash noted.  Scattered ecchymoses over the arms Stasis changes distal lower extremities  Psychiatric: He has a normal mood and affect. His behavior is normal.          Assessment & Plan:  Preventive health examination Rheumatoid arthritis Atrial fibrillation Autoimmune neutropenia and thrombocytopenia  coronary artery disease Hypertension Pernicious anemia Impaired glucose tolerance.  We'll check a hemoglobin A1c   Medical regimen unchanged Laboratory update  will be reviewed Recheck 4 months In

## 2014-06-05 NOTE — Progress Notes (Signed)
Pre visit review using our clinic review tool, if applicable. No additional management support is needed unless otherwise documented below in the visit note. 

## 2014-06-11 ENCOUNTER — Ambulatory Visit (INDEPENDENT_AMBULATORY_CARE_PROVIDER_SITE_OTHER): Payer: Medicare Other | Admitting: Internal Medicine

## 2014-06-11 DIAGNOSIS — E538 Deficiency of other specified B group vitamins: Secondary | ICD-10-CM

## 2014-06-11 MED ORDER — CYANOCOBALAMIN 1000 MCG/ML IJ SOLN
1000.0000 ug | Freq: Once | INTRAMUSCULAR | Status: AC
  Administered 2014-06-11: 1000 ug via INTRAMUSCULAR

## 2014-07-07 ENCOUNTER — Ambulatory Visit: Payer: Medicare Other | Admitting: Internal Medicine

## 2014-07-10 ENCOUNTER — Telehealth: Payer: Self-pay | Admitting: Oncology

## 2014-07-10 ENCOUNTER — Ambulatory Visit (HOSPITAL_BASED_OUTPATIENT_CLINIC_OR_DEPARTMENT_OTHER): Payer: Medicare Other | Admitting: Oncology

## 2014-07-10 ENCOUNTER — Other Ambulatory Visit (HOSPITAL_BASED_OUTPATIENT_CLINIC_OR_DEPARTMENT_OTHER): Payer: Medicare Other

## 2014-07-10 VITALS — BP 137/58 | HR 69 | Temp 98.0°F | Resp 18 | Ht 68.0 in | Wt 153.8 lb

## 2014-07-10 DIAGNOSIS — M069 Rheumatoid arthritis, unspecified: Secondary | ICD-10-CM

## 2014-07-10 DIAGNOSIS — D696 Thrombocytopenia, unspecified: Secondary | ICD-10-CM

## 2014-07-10 DIAGNOSIS — D709 Neutropenia, unspecified: Secondary | ICD-10-CM

## 2014-07-10 DIAGNOSIS — D61818 Other pancytopenia: Secondary | ICD-10-CM

## 2014-07-10 LAB — COMPREHENSIVE METABOLIC PANEL (CC13)
ALT: 12 U/L (ref 0–55)
ANION GAP: 5 meq/L (ref 3–11)
AST: 27 U/L (ref 5–34)
Albumin: 3 g/dL — ABNORMAL LOW (ref 3.5–5.0)
Alkaline Phosphatase: 107 U/L (ref 40–150)
BUN: 16.8 mg/dL (ref 7.0–26.0)
CO2: 26 meq/L (ref 22–29)
CREATININE: 1.1 mg/dL (ref 0.7–1.3)
Calcium: 9 mg/dL (ref 8.4–10.4)
Chloride: 103 mEq/L (ref 98–109)
Glucose: 100 mg/dl (ref 70–140)
Potassium: 4.1 mEq/L (ref 3.5–5.1)
SODIUM: 134 meq/L — AB (ref 136–145)
Total Bilirubin: 0.8 mg/dL (ref 0.20–1.20)
Total Protein: 8 g/dL (ref 6.4–8.3)

## 2014-07-10 LAB — CBC WITH DIFFERENTIAL/PLATELET
BASO%: 1 % (ref 0.0–2.0)
Basophils Absolute: 0 10*3/uL (ref 0.0–0.1)
EOS%: 2.1 % (ref 0.0–7.0)
Eosinophils Absolute: 0 10*3/uL (ref 0.0–0.5)
HEMATOCRIT: 40.6 % (ref 38.4–49.9)
HGB: 14 g/dL (ref 13.0–17.1)
LYMPH%: 27.8 % (ref 14.0–49.0)
MCH: 31.5 pg (ref 27.2–33.4)
MCHC: 34.5 g/dL (ref 32.0–36.0)
MCV: 91.2 fL (ref 79.3–98.0)
MONO#: 0.2 10*3/uL (ref 0.1–0.9)
MONO%: 20.6 % — AB (ref 0.0–14.0)
NEUT#: 0.5 10*3/uL — CL (ref 1.5–6.5)
NEUT%: 48.5 % (ref 39.0–75.0)
PLATELETS: 43 10*3/uL — AB (ref 140–400)
RBC: 4.45 10*6/uL (ref 4.20–5.82)
RDW: 14.7 % — ABNORMAL HIGH (ref 11.0–14.6)
WBC: 1 10*3/uL — ABNORMAL LOW (ref 4.0–10.3)
lymph#: 0.3 10*3/uL — ABNORMAL LOW (ref 0.9–3.3)

## 2014-07-10 NOTE — Telephone Encounter (Signed)
Pt confirmed labs/ov per 11/19 POF, gave pt AVS.... KJ

## 2014-07-10 NOTE — Progress Notes (Signed)
Hematology and Oncology Follow Up Visit  Kevin Rubio [id number] [birth year] 80 y.o. 07/10/2014 10:15 AM  CC: Marletta Lor, MD  Ezzard Standing, M.D.  Minna Merritts, M.D.    Principle Diagnosis: This is an 80 year old gentleman with neutropenia and thrombocytopenia, most likely autoimmune in nature related to rheumatoid arthritis.  Current therapy: Observation and surveillance. He is S/P bone marrow biopsy done in 06/2009 which did not show any abnormalities.  Interim History:  Kevin Rubio presents today for a followup visit by himself. Since the last visit, he has no complications or complaints. He has not reported any opportunistic infections or bleeding.  He continues to live independently with his wife. He had not had any fevers, had not had any chills, had not had any hospitalization.  He had not had any epistaxis.  His performance status and activity level remain at reasonable range. He is able to drive short distances and continues to do so. His arthritis has been under reasonable control. He does not report any headaches blurred vision or seizures. Has not reported any chest pain shortness of breath or difficulty breathing. Has not reported any cough or hemoptysis. Has not reported any nausea or vomiting or abdominal pain. Has not reported any frequency urgency or hesitancy. Rest or view of system is unremarkable.  Medications: I have reviewed the patient's current medications.   Current Outpatient Prescriptions  Medication Sig Dispense Refill  . ascorbic Acid (VITAMIN C) 500 MG CPCR Take 500 mg by mouth daily.      . cyanocobalamin (,VITAMIN B-12,) 1000 MCG/ML injection Inject 1,000 mcg into the muscle every 30 (thirty) days.    . Ferrous Sulfate (IRON CR PO) Take 65 mg by mouth daily.      Marland Kitchen guaiFENesin (MUCINEX) 600 MG 12 hr tablet Take 600 mg by mouth daily.     Marland Kitchen levothyroxine (SYNTHROID, LEVOTHROID) 25 MCG tablet Take 1 tablet (25 mcg total) by mouth daily before  breakfast. 90 tablet 1  . metoprolol tartrate (LOPRESSOR) 25 MG tablet 25 mg 3 (three) times daily. Two tablets in the morning, and one tablet in the afternoon     . Multiple Vitamins-Minerals (CENTRUM SILVER PO) Take by mouth daily.      . Omega-3 Fatty Acids (FISH OIL) 1000 MG CAPS Take 2 capsules by mouth daily.      . pravastatin (PRAVACHOL) 40 MG tablet TAKE ONE TABLET BY MOUTH ONCE DAILY 90 tablet 1  . Tamsulosin HCl (FLOMAX) 0.4 MG CAPS Take 0.4 mg by mouth daily.      Marland Kitchen warfarin (COUMADIN) 4 MG tablet Take 4 mg by mouth daily.     No current facility-administered medications for this visit.    Allergies:  Allergies  Allergen Reactions  . Cefpodoxime Proxetil     Past Medical History, Surgical history, Social history, and Family History were reviewed and updated.   Physical Exam: Blood pressure 137/58, pulse 69, temperature 98 F (36.7 C), temperature source Oral, resp. rate 18, height 5' 8"  (1.727 m), weight 153 lb 12.8 oz (69.763 kg), SpO2 97 %. ECOG: 1 General appearance: alert awake not in any distress. Head: Normocephalic, without obvious abnormality, atraumatic Neck: no adenopathy, no thyroid masses. Lymph nodes: Cervical, supraclavicular, and axillary nodes normal. Heart:regular rate and rhythm, S1, S2 normal, no murmur, click, rub or gallop Lung:chest clear, no wheezing, rales, normal symmetric air entry Abdomin: soft, non-tender, without masses or organomegaly EXT:no erythema, induration, or nodules   Lab Results: Lab Results  Component Value Date   WBC 1.0* 07/10/2014   HGB 14.0 07/10/2014   HCT 40.6 07/10/2014   MCV 91.2 07/10/2014   PLT 43* 07/10/2014     Chemistry      Component Value Date/Time   NA 137 06/05/2014 0941   NA 138 01/08/2014 1003   K 3.6 06/05/2014 0941   K 4.2 01/08/2014 1003   CL 103 06/05/2014 0941   CL 106 12/13/2012 0959   CO2 25 06/05/2014 0941   CO2 23 01/08/2014 1003   BUN 23 06/05/2014 0941   BUN 15.3 01/08/2014 1003    CREATININE 1.3 06/05/2014 0941   CREATININE 1.1 01/08/2014 1003      Component Value Date/Time   CALCIUM 8.9 06/05/2014 0941   CALCIUM 9.0 01/08/2014 1003   ALKPHOS 80 06/05/2014 0941   ALKPHOS 80 01/08/2014 1003   AST 32 06/05/2014 0941   AST 26 01/08/2014 1003   ALT 18 06/05/2014 0941   ALT 15 01/08/2014 1003   BILITOT 1.1 06/05/2014 0941   BILITOT 0.90 01/08/2014 1003     Impression and Plan:  This is a pleasant 80 year old gentleman with the following issues: 1. Thrombocytopenia and neutropenia, most likely autoimmune in nature vs MDS (although bone marrow biopsy was normal in 2010).  His counts are relatively stable at this time and I see no need to repeat a bone marrow biopsy. I feel it will not change our management approach at this time. Even if he does have myelodysplastic syndrome, take use a 4 candidate for any aggressive treatment. I see no need for any growth factor support or transfusions. 2. Rheumatoid arthritis, seems to be stable. 3. Followup will be in 6 months.    NWMGEE,ATVVL, MD 11/19/201510:15 AM

## 2014-07-11 ENCOUNTER — Ambulatory Visit: Payer: Medicare Other | Admitting: Oncology

## 2014-07-11 ENCOUNTER — Ambulatory Visit (INDEPENDENT_AMBULATORY_CARE_PROVIDER_SITE_OTHER): Payer: Medicare Other | Admitting: Internal Medicine

## 2014-07-11 ENCOUNTER — Ambulatory Visit: Payer: Medicare Other | Admitting: Family Medicine

## 2014-07-11 ENCOUNTER — Ambulatory Visit: Payer: Medicare Other | Admitting: *Deleted

## 2014-07-11 ENCOUNTER — Encounter: Payer: Self-pay | Admitting: Internal Medicine

## 2014-07-11 VITALS — BP 120/70 | HR 72 | Temp 98.0°F | Resp 20 | Ht 68.0 in | Wt 152.0 lb

## 2014-07-11 DIAGNOSIS — E039 Hypothyroidism, unspecified: Secondary | ICD-10-CM

## 2014-07-11 DIAGNOSIS — I10 Essential (primary) hypertension: Secondary | ICD-10-CM

## 2014-07-11 DIAGNOSIS — E538 Deficiency of other specified B group vitamins: Secondary | ICD-10-CM

## 2014-07-11 MED ORDER — CYANOCOBALAMIN 1000 MCG/ML IJ SOLN
1000.0000 ug | Freq: Once | INTRAMUSCULAR | Status: AC
  Administered 2014-07-11: 1000 ug via INTRAMUSCULAR

## 2014-07-11 NOTE — Progress Notes (Signed)
Pre visit review using our clinic review tool, if applicable. No additional management support is needed unless otherwise documented below in the visit note. 

## 2014-07-11 NOTE — Progress Notes (Signed)
Subjective:    Patient ID: Kevin Rubio, male    DOB: [birth year], 80 y.o.   MRN: [id number]  HPI  80 year old patient who is seen today for follow-up of hypothyroidism.  He was noted have a recently elevated TSH.  He was started on levothyroxine 25 g daily.  He has tolerated this medication well.  He generally feels better.  He was having some constipation issues that have improved. Recent hematology follow-up yesterday  Past Medical History  Diagnosis Date  . ANEMIA, PERNICIOUS 05/14/2007  . Atrial fibrillation 08/08/2007  . BENIGN PROSTATIC HYPERTROPHY 03/05/2007  . CORONARY ARTERY DISEASE 03/05/2007  . DYSPLASTIC NEVUS 01/24/2008  . HYPERLIPIDEMIA 03/05/2007  . HYPERTENSION 03/05/2007  . MYOCARDIAL INFARCTION, HX OF 03/05/2007    1982  . NEUTROPENIA NOS 05/11/2007  . Rheumatoid arthritis(714.0) 03/05/2007  . VITAMIN B12 DEFICIENCY 08/08/2007  . Current use of long term anticoagulation   . Rheumatoid arthritis(714.0)   . Hernia     umbilical    History   Social History  . Marital Status: Married    Spouse Name: N/A    Number of Children: N/A  . Years of Education: N/A   Occupational History  . Not on file.   Social History Main Topics  . Smoking status: Former Smoker    Quit date: 08/22/1978  . Smokeless tobacco: Not on file  . Alcohol Use: No  . Drug Use: No  . Sexual Activity: Not on file   Other Topics Concern  . Not on file   Social History Narrative    Past Surgical History  Procedure Laterality Date  . Laparoscopic cholecystectomy  11/09/2001    Dr Lennie Hummer  . Nasal polyp surgery    . Ptca  15 yrs ago  . External ear surgery  2 years ago  . Laparoscopic inguinal hernia repair  01/27/11    right direct, Dr Greer Pickerel  . Hernia repair  2012    No family history on file.  Allergies  Allergen Reactions  . Cefpodoxime Proxetil     Current Outpatient Prescriptions on File Prior to Visit  Medication Sig Dispense Refill  . ascorbic Acid (VITAMIN C)  500 MG CPCR Take 500 mg by mouth daily.      . cyanocobalamin (,VITAMIN B-12,) 1000 MCG/ML injection Inject 1,000 mcg into the muscle every 30 (thirty) days.    . Ferrous Sulfate (IRON CR PO) Take 65 mg by mouth daily.      Marland Kitchen guaiFENesin (MUCINEX) 600 MG 12 hr tablet Take 600 mg by mouth daily.     Marland Kitchen levothyroxine (SYNTHROID, LEVOTHROID) 25 MCG tablet Take 1 tablet (25 mcg total) by mouth daily before breakfast. 90 tablet 1  . metoprolol tartrate (LOPRESSOR) 25 MG tablet 25 mg 3 (three) times daily. Two tablets in the morning, and one tablet in the afternoon     . Multiple Vitamins-Minerals (CENTRUM SILVER PO) Take by mouth daily.      . Omega-3 Fatty Acids (FISH OIL) 1000 MG CAPS Take 2 capsules by mouth daily.      . pravastatin (PRAVACHOL) 40 MG tablet TAKE ONE TABLET BY MOUTH ONCE DAILY 90 tablet 1  . Tamsulosin HCl (FLOMAX) 0.4 MG CAPS Take 0.4 mg by mouth daily.      Marland Kitchen warfarin (COUMADIN) 4 MG tablet Take 4 mg by mouth daily.     No current facility-administered medications on file prior to visit.    BP 120/70 mmHg  Pulse 72  Temp(Src) 98 F (36.7 C) (Oral)  Resp 20  Ht 5\' 8"  (1.727 m)  Wt 152 lb (68.947 kg)  BMI 23.12 kg/m2  SpO2 96%     Review of Systems  Constitutional: Negative for fever, chills, appetite change and fatigue.  HENT: Negative for congestion, dental problem, ear pain, hearing loss, sore throat, tinnitus, trouble swallowing and voice change.   Eyes: Negative for pain, discharge and visual disturbance.  Respiratory: Negative for cough, chest tightness, wheezing and stridor.   Cardiovascular: Negative for chest pain, palpitations and leg swelling.  Gastrointestinal: Positive for constipation. Negative for nausea, vomiting, abdominal pain, diarrhea, blood in stool and abdominal distention.  Genitourinary: Negative for urgency, hematuria, flank pain, discharge, difficulty urinating and genital sores.  Musculoskeletal: Negative for myalgias, back pain, joint  swelling, arthralgias, gait problem and neck stiffness.  Skin: Negative for rash.  Neurological: Negative for dizziness, syncope, speech difficulty, weakness, numbness and headaches.  Hematological: Negative for adenopathy. Does not bruise/bleed easily.  Psychiatric/Behavioral: Negative for behavioral problems and dysphoric mood. The patient is not nervous/anxious.        Objective:   Physical Exam  Constitutional: He appears well-developed and well-nourished. No distress.  Blood pressure 120/70 No tachycardia  Cardiovascular: Regular rhythm.           Assessment & Plan:   Hypothyroidism.  Will check a TSH Hypertension, stable  Follow-up hematology Recheck here 4-6 months

## 2014-07-11 NOTE — Patient Instructions (Signed)
Return in 4 months for follow up.

## 2014-07-18 ENCOUNTER — Ambulatory Visit: Payer: Medicare Other | Admitting: Internal Medicine

## 2014-07-25 ENCOUNTER — Ambulatory Visit (INDEPENDENT_AMBULATORY_CARE_PROVIDER_SITE_OTHER): Payer: Medicare Other | Admitting: Internal Medicine

## 2014-07-25 ENCOUNTER — Ambulatory Visit (INDEPENDENT_AMBULATORY_CARE_PROVIDER_SITE_OTHER)
Admission: RE | Admit: 2014-07-25 | Discharge: 2014-07-25 | Disposition: A | Discharge status: Home / Self Care | Payer: Medicare Other | Source: Ambulatory Visit | Attending: Internal Medicine | Admitting: Internal Medicine

## 2014-07-25 ENCOUNTER — Encounter: Payer: Self-pay | Admitting: Internal Medicine

## 2014-07-25 VITALS — BP 154/66 | Temp 97.9°F | Wt 151.8 lb

## 2014-07-25 DIAGNOSIS — M7989 Other specified soft tissue disorders: Secondary | ICD-10-CM

## 2014-07-25 DIAGNOSIS — L089 Local infection of the skin and subcutaneous tissue, unspecified: Secondary | ICD-10-CM

## 2014-07-25 DIAGNOSIS — M069 Rheumatoid arthritis, unspecified: Secondary | ICD-10-CM

## 2014-07-25 DIAGNOSIS — D709 Neutropenia, unspecified: Secondary | ICD-10-CM

## 2014-07-25 MED ORDER — AMOXICILLIN-POT CLAVULANATE 875-125 MG PO TABS: 1.0000 | ORAL_TABLET | Freq: Two times a day (BID) | ORAL | Status: DC

## 2014-07-25 NOTE — Progress Notes (Signed)
Pre visit review using our clinic review tool, if applicable. No additional management support is needed unless otherwise documented below in the visit note.   Chief Complaint  Patient presents with  . Finger discoloration/left hand    [id number]    HPI: Patient Kevin Rubio  comes in today for SDA for  new problem evaluation. Here with wife  PCP na  Almost 2 weeks fo  Redness dorsal ring finger near nail and distal pink and dark  Without pain but some swelling.  coumadin clinic suggested ov after warm soaks which seem to have helped the swelling and streaking up arm  Denies trauma gout  Discharge   Has RA no meds and neutropenia followed at hematology  ROS: See pertinent positives and negatives per HPI.  Past Medical History  Diagnosis Date  . ANEMIA, PERNICIOUS 05/14/2007  . Atrial fibrillation 08/08/2007  . BENIGN PROSTATIC HYPERTROPHY 03/05/2007  . CORONARY ARTERY DISEASE 03/05/2007  . DYSPLASTIC NEVUS 01/24/2008  . HYPERLIPIDEMIA 03/05/2007  . HYPERTENSION 03/05/2007  . MYOCARDIAL INFARCTION, HX OF 03/05/2007    1982  . NEUTROPENIA NOS 05/11/2007  . Rheumatoid arthritis(714.0) 03/05/2007  . VITAMIN B12 DEFICIENCY 08/08/2007  . Current use of long term anticoagulation   . Rheumatoid arthritis(714.0)   . Hernia     umbilical    No family history on file.  History   Social History  . Marital Status: Married    Spouse Name: N/A    Number of Children: N/A  . Years of Education: N/A   Social History Main Topics  . Smoking status: Former Smoker    Quit date: 08/22/1978  . Smokeless tobacco: Not on file  . Alcohol Use: No  . Drug Use: No  . Sexual Activity: Not on file   Other Topics Concern  . Not on file   Social History Narrative    Outpatient Encounter Prescriptions as of 07/25/2014  Medication Sig  . ascorbic Acid (VITAMIN C) 500 MG CPCR Take 500 mg by mouth daily.    . cyanocobalamin (,VITAMIN B-12,) 1000 MCG/ML injection Inject 1,000 mcg into the muscle every 30  (thirty) days.  . Ferrous Sulfate (IRON CR PO) Take 65 mg by mouth daily.    Marland Kitchen guaiFENesin (MUCINEX) 600 MG 12 hr tablet Take 600 mg by mouth daily.   Marland Kitchen levothyroxine (SYNTHROID, LEVOTHROID) 25 MCG tablet Take 1 tablet (25 mcg total) by mouth daily before breakfast.  . metoprolol tartrate (LOPRESSOR) 25 MG tablet 25 mg 3 (three) times daily. Two tablets in the morning, and one tablet in the afternoon   . Multiple Vitamins-Minerals (CENTRUM SILVER PO) Take by mouth daily.    . Omega-3 Fatty Acids (FISH OIL) 1000 MG CAPS Take 2 capsules by mouth daily.    . pravastatin (PRAVACHOL) 40 MG tablet TAKE ONE TABLET BY MOUTH ONCE DAILY  . Tamsulosin HCl (FLOMAX) 0.4 MG CAPS Take 0.4 mg by mouth daily.    Marland Kitchen warfarin (COUMADIN) 4 MG tablet Take 4 mg by mouth daily.  Marland Kitchen amoxicillin-clavulanate (AUGMENTIN) 875-125 MG per tablet Take 1 tablet by mouth 2 (two) times daily.    EXAM:  BP 154/66 mmHg  Temp(Src) 97.9 F (36.6 C) (Oral)  Wt 151 lb 12.8 oz (68.856 kg)  Body mass index is 23.09 kg/(m^2).  GENERAL: vitals reviewed and listed above, alert, oriented, appears well hydrated and in no acute distress hard of hearing    HEENT: atraumatic, conjunctiva  clear, no obvious abnormalities on inspection of external  nose and ears MS:  Hands ulnar deviation  cw RA  Left ring finger witch distal redness ans swelling but no warmth tenderness or dc   Nl cap refill  Faint redness up arm   Non tender    PSYCH: pleasant and cooperative, no obvious depression or anxiety  ASSESSMENT AND PLAN:  Discussed the following assessment and plan:  Swelling of finger, left - Plan: DG Finger Ring Left  Finger infection - Plan: DG Finger Ring Left  Rheumatoid arthritis  Neutropenia Underlying ra deformity and hx of neutropenia  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 Alarm features discussed   -Patient advised to return or notify health care team  if symptoms worsen ,persist or new concerns arise.  Patient Instructions  Concern about infection   Of the finger.  continue soaking    Add antibiotic   Get x ray   As discussed today  Plan recheck  Next week with  A provider but if getting worse then call the on call team .   Standley Brooking. Panosh M.D.

## 2014-07-25 NOTE — Patient Instructions (Signed)
Concern about infection   Of the finger.  continue soaking    Add antibiotic   Get x ray   As discussed today  Plan recheck  Next week with  A provider but if getting worse then call the on call team .

## 2014-08-01 ENCOUNTER — Encounter: Payer: Self-pay | Admitting: Family Medicine

## 2014-08-01 ENCOUNTER — Ambulatory Visit (INDEPENDENT_AMBULATORY_CARE_PROVIDER_SITE_OTHER): Payer: Medicare Other | Admitting: Family Medicine

## 2014-08-01 VITALS — BP 128/72 | HR 66 | Temp 97.8°F | Ht 68.0 in | Wt 154.0 lb

## 2014-08-01 DIAGNOSIS — L03818 Cellulitis of other sites: Secondary | ICD-10-CM

## 2014-08-01 NOTE — Progress Notes (Signed)
Pre visit review using our clinic review tool, if applicable. No additional management support is needed unless otherwise documented below in the visit note. 

## 2014-08-01 NOTE — Progress Notes (Signed)
   Subjective:    Patient ID: Kevin Rubio, male    DOB: September 15, 1921, 80 y.o.   MRN: [id number]  HPI Here to follow up swelling and redness in the left 4th finger. He has been taking Augmentin since then and the finger is looking much better. His Xrays showed some arthritis only. He feels well.    Review of Systems  Constitutional: Negative.   Skin: Positive for color change.       Objective:   Physical Exam  Constitutional: He appears well-developed and well-nourished.  Skin:  The finger looks better with less erythema. No swelling and no tenderness          Assessment & Plan:  He is responding as expected. Finish out 10 days of Augmentin. Recheck prn

## 2014-08-11 ENCOUNTER — Ambulatory Visit (INDEPENDENT_AMBULATORY_CARE_PROVIDER_SITE_OTHER): Payer: Medicare Other | Admitting: *Deleted

## 2014-08-11 DIAGNOSIS — E538 Deficiency of other specified B group vitamins: Secondary | ICD-10-CM

## 2014-08-11 MED ORDER — CYANOCOBALAMIN 1000 MCG/ML IJ SOLN
1000.0000 ug | Freq: Once | INTRAMUSCULAR | Status: AC
  Administered 2014-08-11: 1000 ug via INTRAMUSCULAR

## 2014-08-19 ENCOUNTER — Encounter: Payer: Self-pay | Admitting: Family Medicine

## 2014-08-19 ENCOUNTER — Ambulatory Visit (INDEPENDENT_AMBULATORY_CARE_PROVIDER_SITE_OTHER): Payer: Medicare Other | Admitting: Family Medicine

## 2014-08-19 VITALS — BP 132/70 | HR 64 | Temp 97.2°F | Wt 150.0 lb

## 2014-08-19 DIAGNOSIS — R05 Cough: Secondary | ICD-10-CM

## 2014-08-19 DIAGNOSIS — H9222 Otorrhagia, left ear: Secondary | ICD-10-CM

## 2014-08-19 DIAGNOSIS — R059 Cough, unspecified: Secondary | ICD-10-CM

## 2014-08-19 DIAGNOSIS — R040 Epistaxis: Secondary | ICD-10-CM

## 2014-08-19 LAB — POCT INR: INR: 2.6

## 2014-08-19 MED ORDER — OFLOXACIN 0.3 % OT SOLN: 5.0000 [drp] | Freq: Two times a day (BID) | OTIC | Status: DC

## 2014-08-19 NOTE — Progress Notes (Signed)
Pre visit review using our clinic review tool, if applicable. No additional management support is needed unless otherwise documented below in the visit note. 

## 2014-08-19 NOTE — Progress Notes (Signed)
   Subjective:    Patient ID: Kevin Rubio, male    DOB: [birth year], 80 y.o.   MRN: [id number]  HPI Patient is very hard of hearing so communication is very difficult. He apparently had 2 weeks of cough productive of clear sputum. No dyspnea. No wheezing. No fever. He has used over-the-counter Delsym cough syrup which he states has helped.  He apparently has chronic hearing loss but does complain of some bleeding from the left ear but also intermittent nosebleeds past few days. Denies any injury. He's had some mild left ear pain. He does not use any Q-tips. He does take chronic Coumadin with history of atrial fibrillation. He is not sure when his last pro time was checked.  Past Medical History  Diagnosis Date  . ANEMIA, PERNICIOUS 05/14/2007  . Atrial fibrillation 08/08/2007  . BENIGN PROSTATIC HYPERTROPHY 03/05/2007  . CORONARY ARTERY DISEASE 03/05/2007  . DYSPLASTIC NEVUS 01/24/2008  . HYPERLIPIDEMIA 03/05/2007  . HYPERTENSION 03/05/2007  . MYOCARDIAL INFARCTION, HX OF 03/05/2007    1982  . NEUTROPENIA NOS 05/11/2007  . Rheumatoid arthritis(714.0) 03/05/2007  . VITAMIN B12 DEFICIENCY 08/08/2007  . Current use of long term anticoagulation   . Rheumatoid arthritis(714.0)   . Hernia     umbilical   Past Surgical History  Procedure Laterality Date  . Laparoscopic cholecystectomy  11/09/2001    Dr Lennie Hummer  . Nasal polyp surgery    . Ptca  15 yrs ago  . External ear surgery  2 years ago  . Laparoscopic inguinal hernia repair  01/27/11    right direct, Dr Greer Pickerel  . Hernia repair  2012    reports that he quit smoking about 36 years ago. He has never used smokeless tobacco. He reports that he does not drink alcohol or use illicit drugs. family history is not on file. Allergies  Allergen Reactions  . Cefpodoxime Proxetil       Review of Systems  Constitutional: Negative for fever and chills.  HENT: Positive for ear pain and hearing loss.   Respiratory: Positive for cough.     Cardiovascular: Negative for chest pain.  Neurological: Negative for dizziness.       Objective:   Physical Exam  Constitutional: He appears well-developed and well-nourished.  HENT:  Mouth/Throat: Oropharynx is clear and moist.  Right eardrum appears normal. Left reveals significant cerumen in canal with some clotted blood. No active bleeding. We removed significant amount of cerumen with curette. He still had significant clotted blood which obscures part of the left eardrum. He also has some crusted blood in both nares.  Neck: Neck supple.  Cardiovascular: Normal rate.   Pulmonary/Chest: Effort normal and breath sounds normal. No respiratory distress. He has no wheezes.          Assessment & Plan:  #1 bleeding from left ear. He has evidence for large clot in the left ear-this obscures the TM. He does not have any active bleeding at this time. Check INR with chronic Coumadin use  #2 recent epistaxis. This appears to be more anterior. He has clots both right and left naris medial septum. Check INR as above #3 recent cough with nonfocal exam. He is not have any rales or wheezing and no fever. ?viral process.

## 2014-08-19 NOTE — Patient Instructions (Signed)
Keep ear dry- except for the antibiotic drops prescribed.

## 2014-08-27 ENCOUNTER — Ambulatory Visit (INDEPENDENT_AMBULATORY_CARE_PROVIDER_SITE_OTHER): Payer: Medicare Other | Admitting: Internal Medicine

## 2014-08-27 ENCOUNTER — Encounter: Payer: Self-pay | Admitting: Internal Medicine

## 2014-08-27 VITALS — BP 116/60 | HR 63 | Temp 97.3°F | Resp 18 | Ht 68.0 in | Wt 148.0 lb

## 2014-08-27 DIAGNOSIS — I10 Essential (primary) hypertension: Secondary | ICD-10-CM

## 2014-08-27 DIAGNOSIS — I480 Paroxysmal atrial fibrillation: Secondary | ICD-10-CM | POA: Diagnosis not present

## 2014-08-27 NOTE — Patient Instructions (Signed)
Limit your sodium (Salt) intake  Call or return to clinic prn if these symptoms worsen or fail to improve as anticipated.  Return in 3 months as scheduled

## 2014-08-27 NOTE — Progress Notes (Signed)
Pre visit review using our clinic review tool, if applicable. No additional management support is needed unless otherwise documented below in the visit note. 

## 2014-08-27 NOTE — Progress Notes (Signed)
Subjective:    Patient ID: Kevin Rubio, male    DOB: 03/15/1922, 81 y.o.   MRN: [id number]  HPI  81 year old patient who is seen today in follow-up.  He was seen about 1 week ago with some bleeding from the left ear.  He does have chronic partial deafness.  Bleeding has resolved.  He is on chronic Coumadin anticoagulation and is followed monthly by cardiology.  INR last week was 2.6.  He has had no further bleeding and general feels well.  He still has some mild cough which is improving.  Off is nonproductive. He is scheduled for follow-up in 3 months  Past Medical History  Diagnosis Date  . ANEMIA, PERNICIOUS 05/14/2007  . Atrial fibrillation 08/08/2007  . BENIGN PROSTATIC HYPERTROPHY 03/05/2007  . CORONARY ARTERY DISEASE 03/05/2007  . DYSPLASTIC NEVUS 01/24/2008  . HYPERLIPIDEMIA 03/05/2007  . HYPERTENSION 03/05/2007  . MYOCARDIAL INFARCTION, HX OF 03/05/2007    1982  . NEUTROPENIA NOS 05/11/2007  . Rheumatoid arthritis(714.0) 03/05/2007  . VITAMIN B12 DEFICIENCY 08/08/2007  . Current use of long term anticoagulation   . Rheumatoid arthritis(714.0)   . Hernia     umbilical    History   Social History  . Marital Status: Married    Spouse Name: N/A    Number of Children: N/A  . Years of Education: N/A   Occupational History  . Not on file.   Social History Main Topics  . Smoking status: Former Smoker    Quit date: 08/22/1978  . Smokeless tobacco: Never Used  . Alcohol Use: No  . Drug Use: No  . Sexual Activity: Not on file   Other Topics Concern  . Not on file   Social History Narrative    Past Surgical History  Procedure Laterality Date  . Laparoscopic cholecystectomy  11/09/2001    Dr Lennie Hummer  . Nasal polyp surgery    . Ptca  15 yrs ago  . External ear surgery  2 years ago  . Laparoscopic inguinal hernia repair  01/27/11    right direct, Dr Greer Pickerel  . Hernia repair  2012    No family history on file.  Allergies  Allergen Reactions  . Cefpodoxime  Proxetil     Current Outpatient Prescriptions on File Prior to Visit  Medication Sig Dispense Refill  . amoxicillin-clavulanate (AUGMENTIN) 875-125 MG per tablet Take 1 tablet by mouth 2 (two) times daily. 20 tablet 0  . ascorbic Acid (VITAMIN C) 500 MG CPCR Take 500 mg by mouth daily.      . cyanocobalamin (,VITAMIN B-12,) 1000 MCG/ML injection Inject 1,000 mcg into the muscle every 30 (thirty) days.    . Ferrous Sulfate (IRON CR PO) Take 65 mg by mouth daily.      Marland Kitchen guaiFENesin (MUCINEX) 600 MG 12 hr tablet Take 600 mg by mouth daily.     Marland Kitchen levothyroxine (SYNTHROID, LEVOTHROID) 25 MCG tablet Take 1 tablet (25 mcg total) by mouth daily before breakfast. 90 tablet 1  . metoprolol tartrate (LOPRESSOR) 25 MG tablet 25 mg 3 (three) times daily. Two tablets in the morning, and one tablet in the afternoon     . Multiple Vitamins-Minerals (CENTRUM SILVER PO) Take by mouth daily.      Marland Kitchen ofloxacin (FLOXIN) 0.3 % otic solution Place 5 drops into the left ear 2 (two) times daily. 5 mL 0  . Omega-3 Fatty Acids (FISH OIL) 1000 MG CAPS Take 2 capsules by mouth daily.      Marland Kitchen  pravastatin (PRAVACHOL) 40 MG tablet TAKE ONE TABLET BY MOUTH ONCE DAILY 90 tablet 1  . Tamsulosin HCl (FLOMAX) 0.4 MG CAPS Take 0.4 mg by mouth daily.      Marland Kitchen warfarin (COUMADIN) 4 MG tablet Take 4 mg by mouth daily.     No current facility-administered medications on file prior to visit.    BP 116/60 mmHg  Pulse 63  Temp(Src) 97.3 F (36.3 C) (Oral)  Resp 18  Ht 5\' 8"  (1.727 m)  Wt 148 lb (67.132 kg)  BMI 22.51 kg/m2      Review of Systems  Constitutional: Negative for fever, chills, appetite change and fatigue.  HENT: Negative for congestion, dental problem, ear pain, hearing loss, sore throat, tinnitus, trouble swallowing and voice change.   Eyes: Negative for pain, discharge and visual disturbance.  Respiratory: Negative for cough, chest tightness, wheezing and stridor.   Cardiovascular: Negative for chest pain,  palpitations and leg swelling.  Gastrointestinal: Negative for nausea, vomiting, abdominal pain, diarrhea, constipation, blood in stool and abdominal distention.  Genitourinary: Negative for urgency, hematuria, flank pain, discharge, difficulty urinating and genital sores.  Musculoskeletal: Negative for myalgias, back pain, joint swelling, arthralgias, gait problem and neck stiffness.  Skin: Negative for rash.  Neurological: Negative for dizziness, syncope, speech difficulty, weakness, numbness and headaches.  Hematological: Negative for adenopathy. Does not bruise/bleed easily.  Psychiatric/Behavioral: Negative for behavioral problems and dysphoric mood. The patient is not nervous/anxious.        Objective:   Physical Exam  Constitutional: He is oriented to person, place, and time. He appears well-developed.  HENT:  Head: Normocephalic.  Right Ear: External ear normal.  Left Ear: External ear normal.  Minimal blood on the floor of the left external canal.  Tympanic membrane noninflamed  Eyes: Conjunctivae and EOM are normal.  Neck: Normal range of motion.  Cardiovascular: Normal rate and normal heart sounds.   Pulmonary/Chest: Breath sounds normal.  Decreased breath sounds left lower lung but clear  Abdominal: Bowel sounds are normal.  Musculoskeletal: Normal range of motion. He exhibits no edema or tenderness.  Neurological: He is alert and oriented to person, place, and time.  Psychiatric: He has a normal mood and affect. His behavior is normal.          Assessment & Plan:   Resolving viral bronchitis Chronic Coumadin anticoagulation No further bleeding from the left ear Essential hypertension, stable  Follow cardiology Recheck here in 3 months as scheduled or as needed

## 2014-09-08 ENCOUNTER — Ambulatory Visit (INDEPENDENT_AMBULATORY_CARE_PROVIDER_SITE_OTHER): Payer: Medicare Other | Admitting: *Deleted

## 2014-09-08 DIAGNOSIS — E538 Deficiency of other specified B group vitamins: Secondary | ICD-10-CM

## 2014-09-08 MED ORDER — CYANOCOBALAMIN 1000 MCG/ML IJ SOLN
1000.0000 ug | Freq: Once | INTRAMUSCULAR | Status: AC
  Administered 2014-09-08: 1000 ug via INTRAMUSCULAR

## 2014-09-16 DIAGNOSIS — Z7901 Long term (current) use of anticoagulants: Secondary | ICD-10-CM | POA: Diagnosis not present

## 2014-09-17 ENCOUNTER — Emergency Department (HOSPITAL_COMMUNITY): Payer: Medicare Other

## 2014-09-17 ENCOUNTER — Inpatient Hospital Stay (HOSPITAL_COMMUNITY)
Admission: EM | Admit: 2014-09-17 | Discharge: 2014-09-20 | DRG: 871 | Disposition: A | Discharge status: Home / Self Care | Payer: Medicare Other | Attending: Internal Medicine | Admitting: Internal Medicine

## 2014-09-17 ENCOUNTER — Encounter (HOSPITAL_COMMUNITY): Payer: Self-pay | Admitting: Emergency Medicine

## 2014-09-17 DIAGNOSIS — E860 Dehydration: Secondary | ICD-10-CM | POA: Diagnosis not present

## 2014-09-17 DIAGNOSIS — R404 Transient alteration of awareness: Secondary | ICD-10-CM | POA: Diagnosis not present

## 2014-09-17 DIAGNOSIS — M0500 Felty's syndrome, unspecified site: Secondary | ICD-10-CM | POA: Diagnosis present

## 2014-09-17 DIAGNOSIS — Z87891 Personal history of nicotine dependence: Secondary | ICD-10-CM

## 2014-09-17 DIAGNOSIS — R0602 Shortness of breath: Secondary | ICD-10-CM

## 2014-09-17 DIAGNOSIS — Z881 Allergy status to other antibiotic agents status: Secondary | ICD-10-CM | POA: Diagnosis not present

## 2014-09-17 DIAGNOSIS — I252 Old myocardial infarction: Secondary | ICD-10-CM | POA: Diagnosis not present

## 2014-09-17 DIAGNOSIS — Z7901 Long term (current) use of anticoagulants: Secondary | ICD-10-CM | POA: Diagnosis not present

## 2014-09-17 DIAGNOSIS — I10 Essential (primary) hypertension: Secondary | ICD-10-CM | POA: Diagnosis not present

## 2014-09-17 DIAGNOSIS — I4891 Unspecified atrial fibrillation: Secondary | ICD-10-CM

## 2014-09-17 DIAGNOSIS — E039 Hypothyroidism, unspecified: Secondary | ICD-10-CM | POA: Diagnosis present

## 2014-09-17 DIAGNOSIS — I480 Paroxysmal atrial fibrillation: Secondary | ICD-10-CM

## 2014-09-17 DIAGNOSIS — E785 Hyperlipidemia, unspecified: Secondary | ICD-10-CM | POA: Diagnosis not present

## 2014-09-17 DIAGNOSIS — R079 Chest pain, unspecified: Secondary | ICD-10-CM | POA: Diagnosis not present

## 2014-09-17 DIAGNOSIS — N400 Enlarged prostate without lower urinary tract symptoms: Secondary | ICD-10-CM | POA: Diagnosis present

## 2014-09-17 DIAGNOSIS — L089 Local infection of the skin and subcutaneous tissue, unspecified: Secondary | ICD-10-CM | POA: Diagnosis present

## 2014-09-17 DIAGNOSIS — Z792 Long term (current) use of antibiotics: Secondary | ICD-10-CM

## 2014-09-17 DIAGNOSIS — Z66 Do not resuscitate: Secondary | ICD-10-CM | POA: Diagnosis present

## 2014-09-17 DIAGNOSIS — A419 Sepsis, unspecified organism: Principal | ICD-10-CM

## 2014-09-17 DIAGNOSIS — J189 Pneumonia, unspecified organism: Secondary | ICD-10-CM | POA: Diagnosis not present

## 2014-09-17 DIAGNOSIS — D61818 Other pancytopenia: Secondary | ICD-10-CM | POA: Diagnosis not present

## 2014-09-17 DIAGNOSIS — I2510 Atherosclerotic heart disease of native coronary artery without angina pectoris: Secondary | ICD-10-CM | POA: Diagnosis not present

## 2014-09-17 DIAGNOSIS — M069 Rheumatoid arthritis, unspecified: Secondary | ICD-10-CM | POA: Diagnosis not present

## 2014-09-17 DIAGNOSIS — D709 Neutropenia, unspecified: Secondary | ICD-10-CM | POA: Diagnosis present

## 2014-09-17 DIAGNOSIS — R918 Other nonspecific abnormal finding of lung field: Secondary | ICD-10-CM | POA: Diagnosis not present

## 2014-09-17 DIAGNOSIS — IMO0001 Reserved for inherently not codable concepts without codable children: Secondary | ICD-10-CM | POA: Insufficient documentation

## 2014-09-17 DIAGNOSIS — R197 Diarrhea, unspecified: Secondary | ICD-10-CM | POA: Diagnosis present

## 2014-09-17 DIAGNOSIS — R072 Precordial pain: Secondary | ICD-10-CM | POA: Diagnosis not present

## 2014-09-17 DIAGNOSIS — R531 Weakness: Secondary | ICD-10-CM | POA: Diagnosis not present

## 2014-09-17 LAB — COMPREHENSIVE METABOLIC PANEL
ALK PHOS: 103 U/L (ref 39–117)
ALT: 15 U/L (ref 0–53)
AST: 32 U/L (ref 0–37)
Albumin: 2.9 g/dL — ABNORMAL LOW (ref 3.5–5.2)
Anion gap: 7 (ref 5–15)
BUN: 16 mg/dL (ref 6–23)
CHLORIDE: 105 mmol/L (ref 96–112)
CO2: 21 mmol/L (ref 19–32)
Calcium: 8.5 mg/dL (ref 8.4–10.5)
Creatinine, Ser: 1.16 mg/dL (ref 0.50–1.35)
GFR calc Af Amer: 62 mL/min — ABNORMAL LOW (ref >90)
GFR, EST NON AFRICAN AMERICAN: 54 mL/min — AB (ref >90)
GLUCOSE: 121 mg/dL — AB (ref 70–99)
Potassium: 4.2 mmol/L (ref 3.5–5.1)
Sodium: 133 mmol/L — ABNORMAL LOW (ref 135–145)
TOTAL PROTEIN: 7.7 g/dL (ref 6.0–8.3)
Total Bilirubin: 1.2 mg/dL (ref 0.3–1.2)

## 2014-09-17 LAB — CBC
HCT: 40.8 % (ref 39.0–52.0)
Hemoglobin: 14.6 g/dL (ref 13.0–17.0)
MCH: 31.7 pg (ref 26.0–34.0)
MCHC: 35.8 g/dL (ref 30.0–36.0)
MCV: 88.5 fL (ref 78.0–100.0)
Platelets: 38 10*3/uL — ABNORMAL LOW (ref 150–400)
RBC: 4.61 MIL/uL (ref 4.22–5.81)
RDW: 15.2 % (ref 11.5–15.5)
WBC: 1.5 10*3/uL — ABNORMAL LOW (ref 4.0–10.5)

## 2014-09-17 LAB — URINALYSIS, ROUTINE W REFLEX MICROSCOPIC
Glucose, UA: NEGATIVE mg/dL
Ketones, ur: 15 mg/dL — AB
LEUKOCYTES UA: NEGATIVE
NITRITE: NEGATIVE
Protein, ur: 100 mg/dL — AB
Specific Gravity, Urine: 1.025 (ref 1.005–1.030)
UROBILINOGEN UA: 0.2 mg/dL (ref 0.0–1.0)
pH: 5 (ref 5.0–8.0)

## 2014-09-17 LAB — PROTIME-INR
INR: 2.59 — AB (ref 0.00–1.49)
PROTHROMBIN TIME: 28 s — AB (ref 11.6–15.2)

## 2014-09-17 LAB — URINE MICROSCOPIC-ADD ON

## 2014-09-17 LAB — I-STAT TROPONIN, ED: Troponin i, poc: 0 ng/mL (ref 0.00–0.08)

## 2014-09-17 LAB — BRAIN NATRIURETIC PEPTIDE: B Natriuretic Peptide: 302.1 pg/mL — ABNORMAL HIGH (ref 0.0–100.0)

## 2014-09-17 LAB — I-STAT CG4 LACTIC ACID, ED
LACTIC ACID, VENOUS: 1.93 mmol/L (ref 0.5–2.0)
Lactic Acid, Venous: 0.67 mmol/L (ref 0.5–2.0)

## 2014-09-17 MED ORDER — LEVOFLOXACIN IN D5W 750 MG/150ML IV SOLN
750.0000 mg | Freq: Once | INTRAVENOUS | Status: AC
Start: 2014-09-17 — End: 2014-09-17
  Administered 2014-09-17: 750 mg via INTRAVENOUS
  Filled 2014-09-17: qty 150

## 2014-09-17 MED ORDER — SODIUM CHLORIDE 0.9 % IV BOLUS (SEPSIS)
500.0000 mL | Freq: Once | INTRAVENOUS | Status: AC
  Administered 2014-09-17: 500 mL via INTRAVENOUS

## 2014-09-17 MED ORDER — SODIUM CHLORIDE 0.9 % IV BOLUS (SEPSIS)
1000.0000 mL | Freq: Once | INTRAVENOUS | Status: AC
  Administered 2014-09-17: 1000 mL via INTRAVENOUS

## 2014-09-17 MED ORDER — LEVOFLOXACIN IN D5W 750 MG/150ML IV SOLN: 750.0000 mg | INTRAVENOUS | Status: DC

## 2014-09-17 MED ORDER — VANCOMYCIN HCL IN DEXTROSE 1-5 GM/200ML-% IV SOLN
1000.0000 mg | INTRAVENOUS | Status: DC
  Administered 2014-09-18 – 2014-09-20 (×3): 1000 mg via INTRAVENOUS
  Filled 2014-09-17 (×5): qty 200

## 2014-09-17 MED ORDER — LEVOFLOXACIN IN D5W 750 MG/150ML IV SOLN
750.0000 mg | INTRAVENOUS | Status: DC
  Administered 2014-09-19: 750 mg via INTRAVENOUS
  Filled 2014-09-17 (×2): qty 150

## 2014-09-17 MED ORDER — VANCOMYCIN HCL IN DEXTROSE 1-5 GM/200ML-% IV SOLN
1000.0000 mg | Freq: Once | INTRAVENOUS | Status: AC
  Administered 2014-09-17: 1000 mg via INTRAVENOUS
  Filled 2014-09-17: qty 200

## 2014-09-17 MED ORDER — VANCOMYCIN HCL 500 MG IV SOLR
500.0000 mg | Freq: Two times a day (BID) | INTRAVENOUS | Status: DC
  Filled 2014-09-17: qty 500

## 2014-09-17 MED ORDER — ACETAMINOPHEN 325 MG PO TABS
650.0000 mg | ORAL_TABLET | Freq: Once | ORAL | Status: AC
  Administered 2014-09-17: 650 mg via ORAL
  Filled 2014-09-17: qty 2

## 2014-09-17 NOTE — ED Provider Notes (Addendum)
CSN: [id number]     Arrival date & time 09/17/14  1833 History   First MD Initiated Contact with Patient 09/17/14 1901     Chief Complaint  Patient presents with  . Atrial Fibrillation  . Chest Pain     (Consider location/radiation/quality/duration/timing/severity/associated sxs/prior Treatment) Patient is a 81 y.o. male presenting with atrial fibrillation and chest pain. The history is provided by the patient.  Atrial Fibrillation This is a recurrent problem. The current episode started 6 to 12 hours ago. The problem occurs constantly. The problem has not changed since onset.Associated symptoms include chest pain and shortness of breath. Pertinent negatives include no abdominal pain and no headaches. Nothing aggravates the symptoms. Nothing relieves the symptoms. He has tried nothing for the symptoms. The treatment provided no relief.  Chest Pain Pain location:  Substernal area Pain quality comment:  Bumping Pain radiates to:  Does not radiate Pain radiates to the back: no   Pain severity:  Mild Onset quality:  Gradual Timing:  Intermittent Progression:  Unchanged Chronicity:  New Context: at rest   Relieved by:  Nothing Worsened by:  Nothing tried Ineffective treatments:  None tried Associated symptoms: cough and shortness of breath   Associated symptoms: no abdominal pain, no fever, no headache, no nausea, no numbness and not vomiting     Past Medical History  Diagnosis Date  . ANEMIA, PERNICIOUS 05/14/2007  . Atrial fibrillation 08/08/2007  . BENIGN PROSTATIC HYPERTROPHY 03/05/2007  . CORONARY ARTERY DISEASE 03/05/2007  . DYSPLASTIC NEVUS 01/24/2008  . HYPERLIPIDEMIA 03/05/2007  . HYPERTENSION 03/05/2007  . MYOCARDIAL INFARCTION, HX OF 03/05/2007    1982  . NEUTROPENIA NOS 05/11/2007  . Rheumatoid arthritis(714.0) 03/05/2007  . VITAMIN B12 DEFICIENCY 08/08/2007  . Current use of long term anticoagulation   . Rheumatoid arthritis(714.0)   . Hernia     umbilical   Past  Surgical History  Procedure Laterality Date  . Laparoscopic cholecystectomy  11/09/2001    Dr Lennie Hummer  . Nasal polyp surgery    . Ptca  15 yrs ago  . External ear surgery  2 years ago  . Laparoscopic inguinal hernia repair  01/27/11    right direct, Dr Greer Pickerel  . Hernia repair  2012   No family history on file. History  Substance Use Topics  . Smoking status: Former Smoker    Quit date: 08/22/1978  . Smokeless tobacco: Never Used  . Alcohol Use: No    Review of Systems  Constitutional: Negative for fever.  HENT: Negative for drooling and rhinorrhea.   Eyes: Negative for pain.  Respiratory: Positive for cough and shortness of breath.   Cardiovascular: Positive for chest pain. Negative for leg swelling.  Gastrointestinal: Negative for nausea, vomiting, abdominal pain and diarrhea.  Genitourinary: Negative for dysuria and hematuria.  Musculoskeletal: Negative for gait problem and neck pain.  Skin: Negative for color change.  Neurological: Negative for numbness and headaches.  Hematological: Negative for adenopathy.  Psychiatric/Behavioral: Negative for behavioral problems.  All other systems reviewed and are negative.     Allergies  Cefpodoxime proxetil  Home Medications   Prior to Admission medications   Medication Sig Start Date End Date Taking? Authorizing Provider  amoxicillin-clavulanate (AUGMENTIN) 875-125 MG per tablet Take 1 tablet by mouth 2 (two) times daily. 07/25/14   Burnis Medin, MD  ascorbic Acid (VITAMIN C) 500 MG CPCR Take 500 mg by mouth daily.      Historical Provider, MD  cyanocobalamin (,VITAMIN B-12,) 1000  MCG/ML injection Inject 1,000 mcg into the muscle every 30 (thirty) days.    Historical Provider, MD  Ferrous Sulfate (IRON CR PO) Take 65 mg by mouth daily.      Historical Provider, MD  guaiFENesin (MUCINEX) 600 MG 12 hr tablet Take 600 mg by mouth daily.     Historical Provider, MD  levothyroxine (SYNTHROID, LEVOTHROID) 25 MCG tablet Take  1 tablet (25 mcg total) by mouth daily before breakfast. 06/05/14   Marletta Lor, MD  metoprolol tartrate (LOPRESSOR) 25 MG tablet 25 mg 3 (three) times daily. Two tablets in the morning, and one tablet in the afternoon  10/12/10   Marletta Lor, MD  Multiple Vitamins-Minerals (CENTRUM SILVER PO) Take by mouth daily.      Historical Provider, MD  ofloxacin (FLOXIN) 0.3 % otic solution Place 5 drops into the left ear 2 (two) times daily. 08/19/14   Eulas Post, MD  Omega-3 Fatty Acids (FISH OIL) 1000 MG CAPS Take 2 capsules by mouth daily.      Historical Provider, MD  pravastatin (PRAVACHOL) 40 MG tablet TAKE ONE TABLET BY MOUTH ONCE DAILY 04/18/14   Marletta Lor, MD  Tamsulosin HCl (FLOMAX) 0.4 MG CAPS Take 0.4 mg by mouth daily.      Historical Provider, MD  warfarin (COUMADIN) 4 MG tablet Take 4 mg by mouth daily.    Historical Provider, MD   BP 99/55 mmHg  Pulse 150  Temp(Src) 100 F (37.8 C) (Oral)  Resp 22  Ht 5\' 8"  (1.727 m)  Wt 152 lb (68.947 kg)  BMI 23.12 kg/m2  SpO2 96% Physical Exam  Constitutional: He is oriented to person, place, and time. He appears well-developed and well-nourished.  HENT:  Head: Normocephalic and atraumatic.  Right Ear: External ear normal.  Left Ear: External ear normal.  Nose: Nose normal.  Mouth/Throat: Oropharynx is clear and moist. No oropharyngeal exudate.  Eyes: Conjunctivae and EOM are normal. Pupils are equal, round, and reactive to light.  Neck: Normal range of motion. Neck supple.  Cardiovascular: Normal heart sounds and intact distal pulses.  Exam reveals no gallop and no friction rub.   afib w/ RVR, HR 120's-160's  Pulmonary/Chest: He is in respiratory distress. He has no wheezes.  Abdominal: Soft. Bowel sounds are normal. He exhibits no distension. There is no tenderness. There is no rebound and no guarding.  Musculoskeletal: Normal range of motion. He exhibits no edema or tenderness.  Neurological: He is alert  and oriented to person, place, and time.  Skin: Skin is warm. He is diaphoretic (mild).  Psychiatric: He has a normal mood and affect. His behavior is normal.  Nursing note and vitals reviewed.   ED Course  Procedures (including critical care time) Labs Review Labs Reviewed  CBC - Abnormal; Notable for the following:    WBC 1.5 (*)    Platelets 38 (*)    All other components within normal limits  COMPREHENSIVE METABOLIC PANEL - Abnormal; Notable for the following:    Sodium 133 (*)    Glucose, Bld 121 (*)    Albumin 2.9 (*)    GFR calc non Af Amer 54 (*)    GFR calc Af Amer 62 (*)    All other components within normal limits  BRAIN NATRIURETIC PEPTIDE - Abnormal; Notable for the following:    B Natriuretic Peptide 302.1 (*)    All other components within normal limits  PROTIME-INR - Abnormal; Notable for the following:  Prothrombin Time 28.0 (*)    INR 2.59 (*)    All other components within normal limits  URINALYSIS, ROUTINE W REFLEX MICROSCOPIC - Abnormal; Notable for the following:    Color, Urine AMBER (*)    APPearance CLOUDY (*)    Hgb urine dipstick MODERATE (*)    Bilirubin Urine SMALL (*)    Ketones, ur 15 (*)    Protein, ur 100 (*)    All other components within normal limits  URINE MICROSCOPIC-ADD ON - Abnormal; Notable for the following:    Casts HYALINE CASTS (*)    Crystals CA OXALATE CRYSTALS (*)    All other components within normal limits  CULTURE, BLOOD (ROUTINE X 2)  CULTURE, BLOOD (ROUTINE X 2)  URINE CULTURE  MRSA PCR SCREENING  CULTURE, EXPECTORATED SPUTUM-ASSESSMENT  GRAM STAIN  CLOSTRIDIUM DIFFICILE BY PCR  LEGIONELLA ANTIGEN, URINE  STREP PNEUMONIAE URINARY ANTIGEN  COMPREHENSIVE METABOLIC PANEL  CBC WITH DIFFERENTIAL/PLATELET  I-STAT TROPOININ, ED  I-STAT CG4 LACTIC ACID, ED  I-STAT CG4 LACTIC ACID, ED    Imaging Review Dg Chest Port 1 View  09/17/2014   CLINICAL DATA:  Chest pain, shortness of breath  EXAM: PORTABLE CHEST - 1 VIEW   COMPARISON:  01/25/2011  FINDINGS: Normal heart size. No pleural effusion identified. Lung volumes are low and there are diffuse coarsened interstitial markings noted bilaterally. Airspace opacity within the right lung base is identified. Left lung appears clear.  IMPRESSION: 1. Right base opacity, suspicious for pneumonia.   Electronically Signed   By: Kerby Moors M.D.   On: 09/17/2014 19:43     EKG Interpretation   Date/Time:  Wednesday September 17 2014 18:38:42 EST Ventricular Rate:  158 PR Interval:    QRS Duration: 81 QT Interval:  294 QTC Calculation: 477 R Axis:   40 Text Interpretation:  Atrial fibrillation with rapid V-rate Confirmed by  Beatric Fulop  MD, Gar Glance ([id number]) on 09/17/2014 7:03:04 PM     CRITICAL CARE Performed by: Pamella Pert, S Total critical care time: 30 min Critical care time was exclusive of separately billable procedures and treating other patients. Critical care was necessary to treat or prevent imminent or life-threatening deterioration. Critical care was time spent personally by me on the following activities: development of treatment plan with patient and/or surrogate as well as nursing, discussions with consultants, evaluation of patient's response to treatment, examination of patient, obtaining history from patient or surrogate, ordering and performing treatments and interventions, ordering and review of laboratory studies, ordering and review of radiographic studies, pulse oximetry and re-evaluation of patient's condition.  MDM   Final diagnoses:  SOB (shortness of breath)  Chest pain  Sepsis, due to unspecified organism  CAP (community acquired pneumonia)  Atrial fibrillation with rapid ventricular response    7:29 PM 81 y.o. male w hx of afib on coumadin, CAD, HTN who presents with nonspecific symptoms which began this morning. He states that he has not felt well since he ate breakfast this morning around 7 AM. He notes that he has had  generalized weakness and felt too weak to walk. He is also felt palpitations and some central chest pain intermittently throughout the day. He denies any chest pain now. He has also had some mild shortness of breath. He states that he has had a productive cough recently. He got an aspirin and a nitroglycerin in route. Here he appears to be in A. fib with RVR with a heart rate up to the 160s. He is  also tachypneic with a low-grade temperature. Will get a rectal temp to confirm. His blood pressure is soft with a systolic pressure in the 82M. Will cover for sepsis. Will give a 500 mL bolus and see how the patient responds. Last cardiac echo was in 2008 and showed a EF of 60%. As I am uncertain of the cause of his shortness of breath, will titrate fluids. Will start w/ 1L IVF. We'll continue to monitor closely.  Hospitalist to admit to stepdown. Critical care time billed in this pt w/ community acquired pnemonia, tachypnea w/ RR of 32, HR 150's. VS did improve w/ IVF.    Pamella Pert, MD 09/18/14 0030  Pamella Pert, MD 09/18/14 4158

## 2014-09-17 NOTE — Progress Notes (Addendum)
ANTIBIOTIC CONSULT NOTE - INITIAL  Pharmacy Consult for Vancomycin and Levaquin Indication: CAP  Allergies  Allergen Reactions  . Cefpodoxime Proxetil     Patient Measurements: Height: 5\' 8"  (172.7 cm) Weight: 152 lb (68.947 kg) IBW/kg (Calculated) : 68.4  Vital Signs: Temp: 101.9 F (38.8 C) (01/27 1930) Temp Source: Rectal (01/27 1930) BP: 111/95 mmHg (01/27 1930) Pulse Rate: 122 (01/27 1930) Intake/Output from previous day:   Intake/Output from this shift:    Labs:  Recent Labs  09/17/14 1900  WBC 1.5*  HGB 14.6  PLT 38*  CREATININE 1.16   Estimated Creatinine Clearance: 40.9 mL/min (by C-G formula based on Cr of 1.16). No results for input(s): VANCOTROUGH, VANCOPEAK, VANCORANDOM, GENTTROUGH, GENTPEAK, GENTRANDOM, TOBRATROUGH, TOBRAPEAK, TOBRARND, AMIKACINPEAK, AMIKACINTROU, AMIKACIN in the last 72 hours.   Microbiology: No results found for this or any previous visit (from the past 720 hour(s)).  Medical History: Past Medical History  Diagnosis Date  . ANEMIA, PERNICIOUS 05/14/2007  . Atrial fibrillation 08/08/2007  . BENIGN PROSTATIC HYPERTROPHY 03/05/2007  . CORONARY ARTERY DISEASE 03/05/2007  . DYSPLASTIC NEVUS 01/24/2008  . HYPERLIPIDEMIA 03/05/2007  . HYPERTENSION 03/05/2007  . MYOCARDIAL INFARCTION, HX OF 03/05/2007    1982  . NEUTROPENIA NOS 05/11/2007  . Rheumatoid arthritis(714.0) 03/05/2007  . VITAMIN B12 DEFICIENCY 08/08/2007  . Current use of long term anticoagulation   . Rheumatoid arthritis(714.0)   . Hernia     umbilical    Medications:   (Not in a hospital admission) Scheduled:   Infusions:  . levofloxacin (LEVAQUIN) IV 750 mg (09/17/14 1953)  . [START ON 09/18/2014] levofloxacin (LEVAQUIN) IV    . [START ON 09/18/2014] vancomycin    . vancomycin     Assessment:  67 YOM from home w/ PMH of afib, anemia, BPH, CAD, HLD, HTN, MI 1982, arthritis presenting to [facility] on 09/17/14 c/o afib rvr and CP.  Patient became SOB, nauseated, and dizzy.   CXR reveals right base opacity, suspicious for pneumonia.  Pharmacy has been consulted to dose levofloxacin and vancomycin for CAP.    Patient received Vancomycin 1 g IV x 1 & Levofloxacin 750 mg IV x 1 in ED.    WBC very low 1.5 (lab draw error?), LA 1.93 ok.   Scr 1.16, Crcl ~40-45 mL/min  Goal of Therapy:  Vancomycin trough level 15-20 mcg/ml  Plan:  - Vancomycin 1000 mg IV Q24H - Levaquin 750 mg IV Q48H - Monitor for clinical efficacy and trough levels when appropriate - Monitor renal function (F/U BMP) - Follow up cultures  Hassie Bruce, Pharm. D. Clinical Pharmacy Resident Pager: [phone] Ph: [phone] 09/17/2014 7:54 PM

## 2014-09-17 NOTE — ED Notes (Signed)
Pt arrived from home by [facility] with c/o afib rvr and CP. Pt stated that he woke up this morning around 0745 and did not feel right, with a little bit of weakness. Stated that he has been having CP off and on all day today. Pt weakness has been increasing, became SOB, nauseated and dizzy. EMS administered ASA 324mg  and Nitro x1. Pt currently is pain free since nitro but continues to be SOB especially with exertion.

## 2014-09-17 NOTE — ED Notes (Signed)
Platelet count actually 38 per lab not 310.

## 2014-09-17 NOTE — ED Notes (Signed)
Pt placed on O2 via n/c at 2lpm for tachypnea.

## 2014-09-18 ENCOUNTER — Encounter (HOSPITAL_COMMUNITY): Payer: Self-pay | Admitting: *Deleted

## 2014-09-18 DIAGNOSIS — IMO0001 Reserved for inherently not codable concepts without codable children: Secondary | ICD-10-CM | POA: Insufficient documentation

## 2014-09-18 DIAGNOSIS — I4891 Unspecified atrial fibrillation: Secondary | ICD-10-CM

## 2014-09-18 DIAGNOSIS — J189 Pneumonia, unspecified organism: Secondary | ICD-10-CM

## 2014-09-18 DIAGNOSIS — A419 Sepsis, unspecified organism: Secondary | ICD-10-CM | POA: Diagnosis present

## 2014-09-18 LAB — CBC WITH DIFFERENTIAL/PLATELET
BASOS ABS: 0 10*3/uL (ref 0.0–0.1)
BASOS PCT: 0 % (ref 0–1)
EOS PCT: 1 % (ref 0–5)
Eosinophils Absolute: 0 10*3/uL (ref 0.0–0.7)
HCT: 35 % — ABNORMAL LOW (ref 39.0–52.0)
Hemoglobin: 12.3 g/dL — ABNORMAL LOW (ref 13.0–17.0)
Lymphocytes Relative: 47 % — ABNORMAL HIGH (ref 12–46)
Lymphs Abs: 0.4 10*3/uL — ABNORMAL LOW (ref 0.7–4.0)
MCH: 31.6 pg (ref 26.0–34.0)
MCHC: 35.1 g/dL (ref 30.0–36.0)
MCV: 90 fL (ref 78.0–100.0)
MONOS PCT: 42 % — AB (ref 3–12)
Monocytes Absolute: 0.3 10*3/uL (ref 0.1–1.0)
NEUTROS ABS: 0.1 10*3/uL — AB (ref 1.7–7.7)
NEUTROS PCT: 10 % — AB (ref 43–77)
Platelets: 42 10*3/uL — ABNORMAL LOW (ref 150–400)
RBC: 3.89 MIL/uL — ABNORMAL LOW (ref 4.22–5.81)
RDW: 15.5 % (ref 11.5–15.5)
WBC: 0.8 10*3/uL — AB (ref 4.0–10.5)

## 2014-09-18 LAB — COMPREHENSIVE METABOLIC PANEL WITH GFR
ALT: 13 U/L (ref 0–53)
AST: 22 U/L (ref 0–37)
Albumin: 2.3 g/dL — ABNORMAL LOW (ref 3.5–5.2)
Alkaline Phosphatase: 75 U/L (ref 39–117)
Anion gap: 4 — ABNORMAL LOW (ref 5–15)
BUN: 13 mg/dL (ref 6–23)
CO2: 25 mmol/L (ref 19–32)
Calcium: 7.7 mg/dL — ABNORMAL LOW (ref 8.4–10.5)
Chloride: 108 mmol/L (ref 96–112)
Creatinine, Ser: 1.04 mg/dL (ref 0.50–1.35)
GFR calc Af Amer: 71 mL/min — ABNORMAL LOW
GFR calc non Af Amer: 61 mL/min — ABNORMAL LOW
Glucose, Bld: 94 mg/dL (ref 70–99)
Potassium: 3.7 mmol/L (ref 3.5–5.1)
Sodium: 137 mmol/L (ref 135–145)
Total Bilirubin: 0.9 mg/dL (ref 0.3–1.2)
Total Protein: 6.5 g/dL (ref 6.0–8.3)

## 2014-09-18 LAB — URINE CULTURE: Colony Count: 10000

## 2014-09-18 LAB — MRSA PCR SCREENING: MRSA by PCR: NEGATIVE

## 2014-09-18 LAB — TROPONIN I
Troponin I: 0.03 ng/mL
Troponin I: 0.03 ng/mL

## 2014-09-18 LAB — CLOSTRIDIUM DIFFICILE BY PCR: Toxigenic C. Difficile by PCR: NEGATIVE

## 2014-09-18 LAB — STREP PNEUMONIAE URINARY ANTIGEN: Strep Pneumo Urinary Antigen: NEGATIVE

## 2014-09-18 MED ORDER — LEVOTHYROXINE SODIUM 25 MCG PO TABS
25.0000 ug | ORAL_TABLET | Freq: Every day | ORAL | Status: DC
  Administered 2014-09-18 – 2014-09-20 (×3): 25 ug via ORAL
  Filled 2014-09-18 (×4): qty 1

## 2014-09-18 MED ORDER — VITAMIN C 500 MG PO TABS
500.0000 mg | ORAL_TABLET | Freq: Every day | ORAL | Status: DC
  Administered 2014-09-18 – 2014-09-20 (×3): 500 mg via ORAL
  Filled 2014-09-18 (×3): qty 1

## 2014-09-18 MED ORDER — GUAIFENESIN ER 600 MG PO TB12
600.0000 mg | ORAL_TABLET | Freq: Every day | ORAL | Status: DC
  Administered 2014-09-18 – 2014-09-20 (×3): 600 mg via ORAL
  Filled 2014-09-18 (×3): qty 1

## 2014-09-18 MED ORDER — SODIUM CHLORIDE 0.9 % IV SOLN
INTRAVENOUS | Status: DC
  Administered 2014-09-18: 01:00:00 via INTRAVENOUS
  Administered 2014-09-18: 100 mL/h via INTRAVENOUS
  Administered 2014-09-18: 1000 mL via INTRAVENOUS
  Administered 2014-09-19: 100 mL/h via INTRAVENOUS

## 2014-09-18 MED ORDER — WARFARIN - PHARMACIST DOSING INPATIENT
Freq: Every day | Status: DC
  Administered 2014-09-18 – 2014-09-19 (×2)

## 2014-09-18 MED ORDER — PRAVASTATIN SODIUM 40 MG PO TABS
40.0000 mg | ORAL_TABLET | Freq: Every day | ORAL | Status: DC
  Administered 2014-09-18 – 2014-09-19 (×2): 40 mg via ORAL
  Filled 2014-09-18 (×3): qty 1

## 2014-09-18 MED ORDER — WARFARIN SODIUM 4 MG PO TABS
4.0000 mg | ORAL_TABLET | Freq: Every day | ORAL | Status: DC
  Administered 2014-09-18 (×2): 4 mg via ORAL
  Filled 2014-09-18 (×3): qty 1

## 2014-09-18 NOTE — Progress Notes (Signed)
!/  28/16 Patient transferring from 2 C to Rm 5W 21 with Dx of Sepdis due to Pneumonia, Hx of CAD,Vitamin B Deficency,A-Fib,and  Chronic Diastolic .Placed on C-Diff anf Neutopenic precautions.Daily Weight,Tele,Heart Healthy diet,IV sites 1/28 LFA and RFA with NS at 164ml/hr, Moderate fall risk, Home with wife. Patient is very [place].

## 2014-09-18 NOTE — Progress Notes (Signed)
Utilization review completed. Dragan Tamburrino, RN, BSN. 

## 2014-09-18 NOTE — Progress Notes (Signed)
[facility] PROGRESS NOTE  KELIN BORUM MPN:[id number] DOB: 07/09/1922 DOA: 09/17/2014 PCP: Nyoka Cowden, MD  Assessment/Plan:  Principal Problem:   Sepsis due to pneumonia: hemodynamics improved. Transfer to floor.  Clinically still dehydrated. Will continue IV fluids. Active Problems: Pneumonia: on vancomycin and levaquin.   Essential hypertension:  meds held   CAD (coronary artery disease)   Atrial fibrillation: Cardiologist is Dr. Wynonia Lawman.  Sees a hematologist who has okayed Coumadin. No evidence of bleeding.   Long-term (current) use of anticoagulants   Felty's syndrome/pancytopenia: counts about baseline   Hypothyroidism No further loose stool. c diff neg PT eval  Code Status:  DNR Family Communication:  Wife at bedside Disposition Plan:    Consultants:    Procedures:     Antibiotics:  Vanc/levaquin 1/27  HPI/Subjective: Feels better. No diarrhea today. Cough improving. Has not been oob  Objective: Filed Vitals:   09/18/14 1132  BP: 110/57  Pulse: 78  Temp: 97.6 F (36.4 C)  Resp: 21    Intake/Output Summary (Last 24 hours) at 09/18/14 1328 Last data filed at 09/18/14 0900  Gross per 24 hour  Intake    970 ml  Output    600 ml  Net    370 ml   Filed Weights   09/17/14 1852 09/18/14 0000 09/18/14 0354  Weight: 68.947 kg (152 lb) 67.2 kg (148 lb 2.4 oz) 68.4 kg (150 lb 12.7 oz)    Exam:   General:  HOH. Nontoxic. Comfortable  HEENT: dry MM  Cardiovascular: irreg irreg without MGR  Respiratory: CTA without WRR  Abdomen: S, NT, ND  Ext: no CCE  Basic Metabolic Panel:  Recent Labs Lab 09/17/14 1900 09/18/14 0505  NA 133* 137  K 4.2 3.7  CL 105 108  CO2 21 25  GLUCOSE 121* 94  BUN 16 13  CREATININE 1.16 1.04  CALCIUM 8.5 7.7*   Liver Function Tests:  Recent Labs Lab 09/17/14 1900 09/18/14 0505  AST 32 22  ALT 15 13  ALKPHOS 103 75  BILITOT 1.2 0.9  PROT 7.7 6.5  ALBUMIN 2.9* 2.3*   No results for  input(s): LIPASE, AMYLASE in the last 168 hours. No results for input(s): AMMONIA in the last 168 hours. CBC:  Recent Labs Lab 09/17/14 1900 09/18/14 0505  WBC 1.5* 0.8*  NEUTROABS  --  0.1*  HGB 14.6 12.3*  HCT 40.8 35.0*  MCV 88.5 90.0  PLT 38* 42*   Cardiac Enzymes:  Recent Labs Lab 09/18/14 0500 09/18/14 1138  TROPONINI 0.03 0.03   BNP (last 3 results) No results for input(s): PROBNP in the last 8760 hours. CBG: No results for input(s): GLUCAP in the last 168 hours.  Recent Results (from the past 240 hour(s))  MRSA PCR Screening     Status: None   Collection Time: 09/17/14 11:45 PM  Result Value Ref Range Status   MRSA by PCR NEGATIVE NEGATIVE Final    Comment:        The GeneXpert MRSA Assay (FDA approved for NASAL specimens only), is one component of a comprehensive MRSA colonization surveillance program. It is not intended to diagnose MRSA infection nor to guide or monitor treatment for MRSA infections.      Studies: Dg Chest Port 1 View  09/17/2014   CLINICAL DATA:  Chest pain, shortness of breath  EXAM: PORTABLE CHEST - 1 VIEW  COMPARISON:  01/25/2011  FINDINGS: Normal heart size. No pleural effusion identified. Lung volumes are low and there are  diffuse coarsened interstitial markings noted bilaterally. Airspace opacity within the right lung base is identified. Left lung appears clear.  IMPRESSION: 1. Right base opacity, suspicious for pneumonia.   Electronically Signed   By: Kerby Moors M.D.   On: 09/17/2014 19:43    Scheduled Meds: . guaiFENesin  600 mg Oral Daily  . [START ON 09/19/2014] levofloxacin (LEVAQUIN) IV  750 mg Intravenous Q48H  . levothyroxine  25 mcg Oral QAC breakfast  . pravastatin  40 mg Oral q1800  . vancomycin  1,000 mg Intravenous Q24H  . warfarin  4 mg Oral q1800  . Warfarin - Pharmacist Dosing Inpatient   Does not apply q1800   Continuous Infusions: . sodium chloride 1,000 mL (09/18/14 1234)    Time spent: 35  minutes  [facility]  [email or web address], password [facility] 09/18/2014, 1:28 PM  LOS: 1 day

## 2014-09-18 NOTE — Progress Notes (Signed)
Patient is transferred to 5W21 per hospital bed.  Wife at bedside during this transfer. Report given to Shirlean Mylar, Therapist, sports.

## 2014-09-18 NOTE — Progress Notes (Signed)
ANTICOAGULATION CONSULT NOTE - Initial Consult  Pharmacy Consult for coumadin  Indication: atrial fibrillation  Allergies  Allergen Reactions  . Cefpodoxime Proxetil Other (See Comments)    unknown    Patient Measurements: Height: 5\' 7"  (170.2 cm) Weight: 148 lb 2.4 oz (67.2 kg) IBW/kg (Calculated) : 66.1 Heparin Dosing Weight:   Vital Signs: Temp: 98.7 F (37.1 C) (01/27 2056) Temp Source: Oral (01/27 2056) BP: 107/65 mmHg (01/28 0000) Pulse Rate: 109 (01/28 0000)  Labs:  Recent Labs  09/17/14 1900 09/17/14 1950  HGB 14.6  --   HCT 40.8  --   PLT 38*  --   LABPROT  --  28.0*  INR  --  2.59*  CREATININE 1.16  --     Estimated Creatinine Clearance: 39.6 mL/min (by C-G formula based on Cr of 1.16).   Medical History: Past Medical History  Diagnosis Date  . ANEMIA, PERNICIOUS 05/14/2007  . Atrial fibrillation 08/08/2007  . BENIGN PROSTATIC HYPERTROPHY 03/05/2007  . CORONARY ARTERY DISEASE 03/05/2007  . DYSPLASTIC NEVUS 01/24/2008  . HYPERLIPIDEMIA 03/05/2007  . HYPERTENSION 03/05/2007  . MYOCARDIAL INFARCTION, HX OF 03/05/2007    1982  . NEUTROPENIA NOS 05/11/2007  . Rheumatoid arthritis(714.0) 03/05/2007  . VITAMIN B12 DEFICIENCY 08/08/2007  . Current use of long term anticoagulation   . Rheumatoid arthritis(714.0)   . Hernia     umbilical    Medications:  Prescriptions prior to admission  Medication Sig Dispense Refill Last Dose  . ascorbic Acid (VITAMIN C) 500 MG CPCR Take 500 mg by mouth daily.     09/17/2014 at Unknown time  . cyanocobalamin (,VITAMIN B-12,) 1000 MCG/ML injection Inject 1,000 mcg into the muscle every 30 (thirty) days.   Past Week at Unknown time  . Ferrous Sulfate (IRON CR PO) Take 65 mg by mouth daily.     09/17/2014 at Unknown time  . guaiFENesin (MUCINEX) 600 MG 12 hr tablet Take 600 mg by mouth daily.    09/17/2014 at Unknown time  . levothyroxine (SYNTHROID, LEVOTHROID) 25 MCG tablet Take 1 tablet (25 mcg total) by mouth daily before  breakfast. 90 tablet 1 09/17/2014 at Unknown time  . metoprolol tartrate (LOPRESSOR) 25 MG tablet Take 25-50 mg by mouth 2 (two) times daily. Two tablets in the morning, and one tablet in the afternoon   09/17/2014 at Unknown time  . Multiple Vitamins-Minerals (CENTRUM SILVER PO) Take 1 tablet by mouth daily.    09/17/2014 at Unknown time  . Omega-3 Fatty Acids (FISH OIL) 1000 MG CAPS Take 2 capsules by mouth daily.     09/17/2014 at Unknown time  . pravastatin (PRAVACHOL) 40 MG tablet TAKE ONE TABLET BY MOUTH ONCE DAILY 90 tablet 1 09/16/2014 at Unknown time  . warfarin (COUMADIN) 4 MG tablet Take 4 mg by mouth daily at 6 PM.    09/16/2014 at Unknown time  . amoxicillin-clavulanate (AUGMENTIN) 875-125 MG per tablet Take 1 tablet by mouth 2 (two) times daily. 20 tablet 0 Taking  . ofloxacin (FLOXIN) 0.3 % otic solution Place 5 drops into the left ear 2 (two) times daily. 5 mL 0 Taking    Assessment: 81 yo admitted for CAP. Coumadin for afib to continue. inr is 2.54 on 4mg  daily. Last dose 1/26 in pm. H/h normal   plt 38  Hx of pernicious anemia and neutropenias.  Goal of Therapy:  INR 2-3 Monitor platelets by anticoagulation protocol: Yes   Plan:  Continue home dose of 4mg  starting tonight.. Daily  INR and f/u plt's   Curlene Dolphin 09/18/2014,12:19 AM

## 2014-09-18 NOTE — Progress Notes (Signed)
CRITICAL VALUE ALERT  Critical value received:  WBC 0.8  Date of notification:  09/18/14  Time of notification:  0626  Critical value read back: yes  Nurse who received alert: Remo Lipps  MD notified (1st page):  Dr. Posey Pronto  Time of first page:  0629  MD notified (2nd page):  Time of second page:  Responding MD:  Dr. Posey Pronto  Time MD responded:  [phone]

## 2014-09-18 NOTE — H&P (Signed)
[facility] History and Physical  Patient: Kevin Rubio  MRN: [id number]  DOB: [birth year]  DOS: the patient was seen and examined on 09/17/2014 PCP: Nyoka Cowden, MD  Chief Complaint: Tachycardia and chest pain with generalized weakness  HPI: Kevin Rubio is a 81 y.o. male with Past medical history of chronic pancytopenia, rheumatoid arthritis, coronary artery disease, hypertension, A. fib on anticoagulation, chronic diastolic dysfunction. The patient presented with complaints of tachycardia and generalized weakness. Patient was recently placed on antibiotic a month ago for skin infection which improved. He was noted to have some bleeding from the left ear which also improved.  Earlier this morning when patient woke up he was feeling tired and fatigued and he felt that his heart was racing too fast. He had 3 episodes of loose watery bowel movement without any blood or black color bowel movement. He denies any chest pain to me and complains that he felt that his heart was racing on and off. He also complains of some dizziness. He has a chronic cough for last 3 weeks which was thought to be viral in nature. At the time of my evaluation patient denies any chest pain, nausea, vomiting, dizziness, lightheadedness, fever, burning urination. There was no recent change in his medication reported.  The patient is coming from ALF. And at his baseline independent for most of his ADL.  Review of Systems: as mentioned in the history of present illness.  A Comprehensive review of the other systems is negative.  Past Medical History  Diagnosis Date  . ANEMIA, PERNICIOUS 05/14/2007  . Atrial fibrillation 08/08/2007  . BENIGN PROSTATIC HYPERTROPHY 03/05/2007  . CORONARY ARTERY DISEASE 03/05/2007  . DYSPLASTIC NEVUS 01/24/2008  . HYPERLIPIDEMIA 03/05/2007  . HYPERTENSION 03/05/2007  . MYOCARDIAL INFARCTION, HX OF 03/05/2007    1982  . NEUTROPENIA NOS 05/11/2007  . Rheumatoid  arthritis(714.0) 03/05/2007  . VITAMIN B12 DEFICIENCY 08/08/2007  . Current use of long term anticoagulation   . Rheumatoid arthritis(714.0)   . Hernia     umbilical   Past Surgical History  Procedure Laterality Date  . Laparoscopic cholecystectomy  11/09/2001    Dr Lennie Hummer  . Nasal polyp surgery    . Ptca  15 yrs ago  . External ear surgery  2 years ago  . Laparoscopic inguinal hernia repair  01/27/11    right direct, Dr Greer Pickerel  . Hernia repair  2012   Social History:  reports that he quit smoking about 36 years ago. He has never used smokeless tobacco. He reports that he does not drink alcohol or use illicit drugs.  Allergies  Allergen Reactions  . Cefpodoxime Proxetil Other (See Comments)    unknown    No family history on file.  Prior to Admission medications   Medication Sig Start Date End Date Taking? Authorizing Provider  ascorbic Acid (VITAMIN C) 500 MG CPCR Take 500 mg by mouth daily.     Yes Historical Provider, MD  cyanocobalamin (,VITAMIN B-12,) 1000 MCG/ML injection Inject 1,000 mcg into the muscle every 30 (thirty) days.   Yes Historical Provider, MD  Ferrous Sulfate (IRON CR PO) Take 65 mg by mouth daily.     Yes Historical Provider, MD  guaiFENesin (MUCINEX) 600 MG 12 hr tablet Take 600 mg by mouth daily.    Yes Historical Provider, MD  levothyroxine (SYNTHROID, LEVOTHROID) 25 MCG tablet Take 1 tablet (25 mcg total) by mouth daily before breakfast. 06/05/14  Yes Marletta Lor, MD  metoprolol tartrate (LOPRESSOR) 25 MG tablet Take 25-50 mg by mouth 2 (two) times daily. Two tablets in the morning, and one tablet in the afternoon 10/12/10  Yes Marletta Lor, MD  Multiple Vitamins-Minerals (CENTRUM SILVER PO) Take 1 tablet by mouth daily.    Yes Historical Provider, MD  Omega-3 Fatty Acids (FISH OIL) 1000 MG CAPS Take 2 capsules by mouth daily.     Yes Historical Provider, MD  pravastatin (PRAVACHOL) 40 MG tablet TAKE ONE TABLET BY MOUTH ONCE DAILY  04/18/14  Yes Marletta Lor, MD  warfarin (COUMADIN) 4 MG tablet Take 4 mg by mouth daily at 6 PM.    Yes Historical Provider, MD  amoxicillin-clavulanate (AUGMENTIN) 875-125 MG per tablet Take 1 tablet by mouth 2 (two) times daily. 07/25/14   Burnis Medin, MD  ofloxacin (FLOXIN) 0.3 % otic solution Place 5 drops into the left ear 2 (two) times daily. 08/19/14   Eulas Post, MD    Physical Exam: Filed Vitals:   09/18/14 0300 09/18/14 0354 09/18/14 0400 09/18/14 0500  BP: 96/54  113/92 90/61  Pulse: 79  83 82  Temp:   98.2 F (36.8 C)   TempSrc:   Oral   Resp: 22  21 18   Height:      Weight:  68.4 kg (150 lb 12.7 oz)    SpO2: 99%  100% 97%    General: Alert, Awake and Oriented to Time, Place and Person. Appear in mild distress Eyes: PERRL ENT: Oral Mucosa clear moist. Neck: no JVD Cardiovascular: S1 and S2 Present, aortic systolic Murmur, Peripheral Pulses Present Respiratory: Bilateral Air entry equal and Decreased, faint basal Crackles, no wheezes Abdomen: Bowel Sound present, Soft and no tender Skin: no Rash Extremities: no Pedal edema, no calf tenderness Neurologic: Grossly no focal neuro deficit.  Labs on Admission:  CBC:  Recent Labs Lab 09/17/14 1900  WBC 1.5*  HGB 14.6  HCT 40.8  MCV 88.5  PLT 38*    CMP     Component Value Date/Time   NA 133* 09/17/2014 1900   NA 134* 07/10/2014 0937   K 4.2 09/17/2014 1900   K 4.1 07/10/2014 0937   CL 105 09/17/2014 1900   CL 106 12/13/2012 0959   CO2 21 09/17/2014 1900   CO2 26 07/10/2014 0937   GLUCOSE 121* 09/17/2014 1900   GLUCOSE 100 07/10/2014 0937   GLUCOSE 146* 12/13/2012 0959   BUN 16 09/17/2014 1900   BUN 16.8 07/10/2014 0937   CREATININE 1.16 09/17/2014 1900   CREATININE 1.1 07/10/2014 0937   CALCIUM 8.5 09/17/2014 1900   CALCIUM 9.0 07/10/2014 0937   PROT 7.7 09/17/2014 1900   PROT 8.0 07/10/2014 0937   ALBUMIN 2.9* 09/17/2014 1900   ALBUMIN 3.0* 07/10/2014 0937   AST 32 09/17/2014  1900   AST 27 07/10/2014 0937   ALT 15 09/17/2014 1900   ALT 12 07/10/2014 0937   ALKPHOS 103 09/17/2014 1900   ALKPHOS 107 07/10/2014 0937   BILITOT 1.2 09/17/2014 1900   BILITOT 0.80 07/10/2014 0937   GFRNONAA 54* 09/17/2014 1900   GFRAA 62* 09/17/2014 1900    No results for input(s): LIPASE, AMYLASE in the last 168 hours.  No results for input(s): CKTOTAL, CKMB, CKMBINDEX, TROPONINI in the last 168 hours. BNP (last 3 results) No results for input(s): PROBNP in the last 8760 hours.  Radiological Exams on Admission: Dg Chest Port 1 View  09/17/2014   CLINICAL DATA:  Chest pain, shortness  of breath  EXAM: PORTABLE CHEST - 1 VIEW  COMPARISON:  01/25/2011  FINDINGS: Normal heart size. No pleural effusion identified. Lung volumes are low and there are diffuse coarsened interstitial markings noted bilaterally. Airspace opacity within the right lung base is identified. Left lung appears clear.  IMPRESSION: 1. Right base opacity, suspicious for pneumonia.   Electronically Signed   By: Kerby Moors M.D.   On: 09/17/2014 19:43   EKG: Independently reviewed. atrial fibrillation, RVR.  Assessment/Plan Principal Problem:   Sepsis due to pneumonia Active Problems:   Neutropenia   Essential hypertension   CAD (coronary artery disease)   Atrial fibrillation   Long-term (current) use of anticoagulants   Felty's syndrome   Hypothyroidism   1. Sepsis due to pneumonia  Chest pain and A. fib with RVR Diarrhea  The patient is presenting with numbness of cough and shortness of breath, associated with chest pain and tachycardia. Primary complaint is tachycardia. Chest x-ray shows right basal opacity suggesting pneumonia. Patient has chronic pancytopenia with absolute neutropenia. With this blood cultures have been obtained. Currently patient is admitted in stepdown unit. He'll be treated with vancomycin and Levaquin for broad-spectrum coverage. Follow cultures. Patient also complains of  diarrhea which could be C. difficile due to his recent antibiotic use. Check C. difficile. Patient's initial troponin does not show any evidence of acute ischemia. Follow serial troponin. Limited echocardiogram in the morning. Patient is a has responded to IV hydration. Etiology of A. fib Most likely secondary to diarrhea as well as ongoing infection. Monitor on telemetry. Holding rate control medication in view of her blood pressure.  2. Pancytopenia. Continue close monitoring. Patient is on warfarin for his A. fib. Continue.  3. Hypertension. Patient on metoprolol. Currently blood pressures troughed. Holding at present.  4. Hypothyroidism. Continuing Synthroid.  Advance goals of care discussion: DNR/DNI as per my discussion with patient's wife   DVT Prophylaxis: on chronic anticoagulation Nutrition: Cardiac diet  Family Communication: Family was present at bedside, opportunity was given to ask question and all questions were answered satisfactorily at the time of interview. Disposition: Admitted to inpatient in step-down unit.  Author: Berle Mull, MD [facility] Pager: [phone] 09/18/2014, 6:07 AM    If 7PM-7AM, please contact night-coverage [email or web address] Password [email or web address]

## 2014-09-18 NOTE — Progress Notes (Signed)
[facility] notified that pt has crackles in left lower lobe no new orders will continue to monitor pt was not in respiratory distress. Arthor Captain LPN

## 2014-09-19 LAB — CBC WITH DIFFERENTIAL/PLATELET
Basophils Absolute: 0 10*3/uL (ref 0.0–0.1)
Basophils Relative: 2 % — ABNORMAL HIGH (ref 0–1)
Eosinophils Absolute: 0 10*3/uL (ref 0.0–0.7)
Eosinophils Relative: 6 % — ABNORMAL HIGH (ref 0–5)
HEMATOCRIT: 33.9 % — AB (ref 39.0–52.0)
HEMOGLOBIN: 11.9 g/dL — AB (ref 13.0–17.0)
LYMPHS ABS: 0.4 10*3/uL — AB (ref 0.7–4.0)
Lymphocytes Relative: 52 % — ABNORMAL HIGH (ref 12–46)
MCH: 31.2 pg (ref 26.0–34.0)
MCHC: 35.1 g/dL (ref 30.0–36.0)
MCV: 89 fL (ref 78.0–100.0)
MONO ABS: 0.2 10*3/uL (ref 0.1–1.0)
Monocytes Relative: 27 % — ABNORMAL HIGH (ref 3–12)
Neutro Abs: 0.1 10*3/uL — ABNORMAL LOW (ref 1.7–7.7)
Neutrophils Relative %: 13 % — ABNORMAL LOW (ref 43–77)
Platelets: 47 10*3/uL — ABNORMAL LOW (ref 150–400)
RBC: 3.81 MIL/uL — ABNORMAL LOW (ref 4.22–5.81)
RDW: 15.4 % (ref 11.5–15.5)
Smear Review: DECREASED
WBC: 0.7 10*3/uL — AB (ref 4.0–10.5)

## 2014-09-19 LAB — BASIC METABOLIC PANEL
ANION GAP: 4 — AB (ref 5–15)
BUN: 10 mg/dL (ref 6–23)
CO2: 24 mmol/L (ref 19–32)
CREATININE: 0.93 mg/dL (ref 0.50–1.35)
Calcium: 8 mg/dL — ABNORMAL LOW (ref 8.4–10.5)
Chloride: 109 mmol/L (ref 96–112)
GFR calc non Af Amer: 72 mL/min — ABNORMAL LOW (ref >90)
GFR, EST AFRICAN AMERICAN: 83 mL/min — AB (ref >90)
Glucose, Bld: 88 mg/dL (ref 70–99)
Potassium: 3.7 mmol/L (ref 3.5–5.1)
Sodium: 137 mmol/L (ref 135–145)

## 2014-09-19 LAB — PROTIME-INR
INR: 2.77 — ABNORMAL HIGH (ref 0.00–1.49)
PROTHROMBIN TIME: 29.5 s — AB (ref 11.6–15.2)

## 2014-09-19 LAB — LEGIONELLA ANTIGEN, URINE

## 2014-09-19 MED ORDER — WARFARIN SODIUM 4 MG PO TABS
4.0000 mg | ORAL_TABLET | Freq: Every day | ORAL | Status: DC
  Filled 2014-09-19: qty 1

## 2014-09-19 MED ORDER — WARFARIN SODIUM 2 MG PO TABS
2.0000 mg | ORAL_TABLET | Freq: Once | ORAL | Status: AC
  Administered 2014-09-19: 2 mg via ORAL
  Filled 2014-09-19: qty 1

## 2014-09-19 NOTE — Evaluation (Signed)
Physical Therapy Evaluation Patient Details Name: Kevin Rubio MRN: [id number] DOB: 08-16-1922 Today's Date: 09/19/2014   History of Present Illness  pt is a 81 y/o male who presented with complaints of tachycardia and generalized weakness.  Determined to be septic due to PNA.  Clinical Impression  Pt admitted with/for general weakness, tachycardia, sepsis due to PNA.  Pt currently limited functionally due to the problems listed below.  (see problems list.)  Pt will benefit from PT to maximize function and safety to be able to get home safely with available assist of wife.     Follow Up Recommendations Home health PT    Equipment Recommendations  Rolling walker with 5" wheels    Recommendations for Other Services       Precautions / Restrictions Precautions Precautions: Fall      Mobility  Bed Mobility Overal bed mobility: Needs Assistance Bed Mobility: Supine to Sit     Supine to sit: Supervision        Transfers Overall transfer level: Needs assistance   Transfers: Sit to/from Stand Sit to Stand: Supervision            Ambulation/Gait Ambulation/Gait assistance: Min guard;Supervision Ambulation Distance (Feet): 220 Feet Assistive device: Rolling walker (2 wheeled) Gait Pattern/deviations: Step-through pattern Gait velocity: slower   General Gait Details: mildly unsteady and more steady with RW  Stairs            Wheelchair Mobility    Modified Rankin (Stroke Patients Only)       Balance Overall balance assessment: Needs assistance Sitting-balance support: No upper extremity supported Sitting balance-Leahy Scale: Good     Standing balance support: No upper extremity supported Standing balance-Leahy Scale: Fair                               Pertinent Vitals/Pain Pain Assessment: No/denies pain    Home Living Family/patient expects to be discharged to:: Private residence Living Arrangements: Spouse/significant  other Available Help at Discharge: Family Type of Home: House Home Access: Stairs to enter Entrance Stairs-Rails: None Technical brewer of Steps: 1 Home Layout: One level Home Equipment: Shower seat      Prior Function Level of Independence: Independent               Hand Dominance        Extremity/Trunk Assessment   Upper Extremity Assessment: Generalized weakness (grip weak and with arthritic deformities bil)           Lower Extremity Assessment: Overall WFL for tasks assessed (grossly 4/5 or better bil)      Cervical / Trunk Assessment: Kyphotic  Communication   Communication: HOH  Cognition Arousal/Alertness: Awake/alert Behavior During Therapy: WFL for tasks assessed/performed Overall Cognitive Status: Within Functional Limits for tasks assessed                      General Comments      Exercises        Assessment/Plan    PT Assessment Patient needs continued PT services  PT Diagnosis Generalized weakness   PT Problem List Decreased strength;Decreased activity tolerance;Decreased balance;Decreased mobility;Decreased knowledge of use of DME  PT Treatment Interventions DME instruction;Gait training;Functional mobility training;Therapeutic activities;Balance training;Patient/family education   PT Goals (Current goals can be found in the Care Plan section) Acute Rehab PT Goals Patient Stated Goal: back home, Independent PT Goal Formulation: With patient Time For Goal Achievement:  09/26/14 Potential to Achieve Goals: Good    Frequency Min 3X/week   Barriers to discharge        Co-evaluation               End of Session   Activity Tolerance: Patient tolerated treatment well Patient left: with family/visitor present;Other (comment);with call bell/phone within reach (sitting EOB) Nurse Communication: Mobility status         Time: 7225-7505 PT Time Calculation (min) (ACUTE ONLY): 27 min   Charges:   PT  Evaluation $Initial PT Evaluation Tier I: 1 Procedure PT Treatments $Gait Training: 8-22 mins   PT G Codes:        Shelsy Seng, Tessie Fass 09/19/2014, 12:27 PM  09/19/2014  Donnella Sham, [place] [phone]  (pager)

## 2014-09-19 NOTE — Progress Notes (Signed)
[facility] PROGRESS NOTE  Kevin Rubio JJO:[id number] DOB: 22-Jun-1922 DOA: 09/17/2014 PCP: Kevin Cowden, MD  Assessment/Plan:     Sepsis due to pneumonia: hemodynamics improved.  -home O2 eval -stop IVF -PT eval  Pneumonia: on vancomycin and levaquin- continue through today and then d/c on just levaquin tomm if stable   Essential hypertension:  meds held   CAD (coronary artery disease)   Atrial fibrillation: Cardiologist is Kevin Rubio.  Sees a hematologist who has okayed Coumadin. No evidence of bleeding.   Long-term (current) use of anticoagulants   Felty's syndrome/pancytopenia: counts about baseline   Hypothyroidism No further loose stool. c diff neg   Code Status:  DNR Family Communication:  Kevin Rubio spoke with Kevin Rubio 1/28 Disposition Plan:  Home 1-2 days  Consultants:    Procedures:     Antibiotics:  Vanc/levaquin 1/27  HPI/Subjective: Walked some last night Had a bath this AM  Objective: Filed Vitals:   09/19/14 0547  BP: 119/58  Pulse: 73  Temp: 97.7 F (36.5 C)  Resp: 24    Intake/Output Summary (Last 24 hours) at 09/19/14 0824 Last data filed at 09/19/14 0755  Gross per 24 hour  Intake   3220 ml  Output   1310 ml  Net   1910 ml   Filed Weights   09/18/14 0000 09/18/14 0354 09/19/14 0544  Weight: 67.2 kg (148 lb 2.4 oz) 68.4 kg (150 lb 12.7 oz) 68.6 kg (151 lb 3.8 oz)    Exam:   General:  HOH. Nontoxic. Comfortable- hard of hearing  Cardiovascular: irreg irreg without MGR  Respiratory: CTA, no wheezing  Abdomen: S, NT, ND  Ext: no CCE  Basic Metabolic Panel:  Recent Labs Lab 09/17/14 1900 09/18/14 0505 09/19/14 0535  NA 133* 137 137  K 4.2 3.7 3.7  CL 105 108 109  CO2 21 25 24   GLUCOSE 121* 94 88  BUN 16 13 10   CREATININE 1.16 1.04 0.93  CALCIUM 8.5 7.7* 8.0*   Liver Function Tests:  Recent Labs Lab 09/17/14 1900 09/18/14 0505  AST 32 22  ALT 15 13  ALKPHOS 103 75  BILITOT 1.2 0.9   PROT 7.7 6.5  ALBUMIN 2.9* 2.3*   No results for input(s): LIPASE, AMYLASE in the last 168 hours. No results for input(s): AMMONIA in the last 168 hours. CBC:  Recent Labs Lab 09/17/14 1900 09/18/14 0505 09/19/14 0535  WBC 1.5* 0.8* 0.7*  NEUTROABS  --  0.1* PENDING  HGB 14.6 12.3* 11.9*  HCT 40.8 35.0* 33.9*  MCV 88.5 90.0 89.0  PLT 38* 42* 47*   Cardiac Enzymes:  Recent Labs Lab 09/18/14 0500 09/18/14 1138  TROPONINI 0.03 0.03   BNP (last 3 results) No results for input(s): PROBNP in the last 8760 hours. CBG: No results for input(s): GLUCAP in the last 168 hours.  Recent Results (from the past 240 hour(s))  Urine culture     Status: None   Collection Time: 09/17/14  7:49 PM  Result Value Ref Range Status   Specimen Description URINE, CLEAN CATCH  Final   Special Requests NONE  Final   Colony Count   Final    10,000 COLONIES/ML Performed at [facility]    Culture   Final    Multiple bacterial morphotypes present, none predominant. Suggest appropriate recollection if clinically indicated. Performed at [facility]    Report Status 09/18/2014 FINAL  Final  Blood Culture (routine x 2)     Status: None (  Preliminary result)   Collection Time: 09/17/14  7:50 PM  Result Value Ref Range Status   Specimen Description BLOOD FOREARM RIGHT  Final   Special Requests BOTTLES DRAWN AEROBIC AND ANAEROBIC 5CC  Final   Culture   Final           BLOOD CULTURE RECEIVED NO GROWTH TO DATE CULTURE WILL BE HELD FOR 5 DAYS BEFORE ISSUING A FINAL NEGATIVE REPORT Performed at [facility]    Report Status PENDING  Incomplete  Blood Culture (routine x 2)     Status: None (Preliminary result)   Collection Time: 09/17/14  7:52 PM  Result Value Ref Range Status   Specimen Description BLOOD ARM LEFT  Final   Special Requests BOTTLES DRAWN AEROBIC AND ANAEROBIC 5CC  Final   Culture   Final           BLOOD CULTURE RECEIVED NO GROWTH TO DATE CULTURE WILL BE  HELD FOR 5 DAYS BEFORE ISSUING A FINAL NEGATIVE REPORT Performed at [facility]    Report Status PENDING  Incomplete  MRSA PCR Screening     Status: None   Collection Time: 09/17/14 11:45 PM  Result Value Ref Range Status   MRSA by PCR NEGATIVE NEGATIVE Final    Comment:        The GeneXpert MRSA Assay (FDA approved for NASAL specimens only), is one component of a comprehensive MRSA colonization surveillance program. It is not intended to diagnose MRSA infection nor to guide or monitor treatment for MRSA infections.   Clostridium Difficile by PCR     Status: None   Collection Time: 09/18/14 12:43 PM  Result Value Ref Range Status   C difficile by pcr NEGATIVE NEGATIVE Final     Studies: Dg Chest Port 1 View  09/17/2014   CLINICAL DATA:  Chest pain, shortness of breath  EXAM: PORTABLE CHEST - 1 VIEW  COMPARISON:  01/25/2011  FINDINGS: Normal heart size. No pleural effusion identified. Lung volumes are low and there are diffuse coarsened interstitial markings noted bilaterally. Airspace opacity within the right lung base is identified. Left lung appears clear.  IMPRESSION: 1. Right base opacity, suspicious for pneumonia.   Electronically Signed   By: Kevin Rubio M.D.   On: 09/17/2014 19:43    Scheduled Meds: . guaiFENesin  600 mg Oral Daily  . levofloxacin (LEVAQUIN) IV  750 mg Intravenous Q48H  . levothyroxine  25 mcg Oral QAC breakfast  . pravastatin  40 mg Oral q1800  . vancomycin  1,000 mg Intravenous Q24H  . vitamin C  500 mg Oral Daily  . warfarin  4 mg Oral q1800  . Warfarin - Pharmacist Dosing Inpatient   Does not apply q1800   Continuous Infusions:    Time spent: 25 minutes  Kevin Rubio  [facility]  [email or web address], password [facility] 09/19/2014, 8:24 AM  LOS: 2 days

## 2014-09-19 NOTE — Progress Notes (Signed)
SATURATION QUALIFICATIONS: (This note is used to comply with regulatory documentation for home oxygen)  Patient Saturations on Room Air at Rest = NA  Patient Saturations on Room Air while Ambulating = 91%  Patient Saturations on  Liters of oxygen while Ambulating = NA  Please briefly explain why patient needs home oxygen:Pt does not require supplemental oxygen at this time and is maintaining 90/91% at rest or during gait on RA. 09/19/2014  Donnella Sham, PT [phone]  (pager)

## 2014-09-19 NOTE — Care Management Note (Signed)
    Page 1 of 2   09/20/2014     10:42:10 AM CARE MANAGEMENT NOTE 09/20/2014  Patient:  Kevin Rubio, Kevin Rubio   Account Number:  [id number]  Date Initiated:  09/19/2014  Documentation initiated by:  Tomi Bamberger  Subjective/Objective Assessment:   dx afib  admit- lives with spouse.     Action/Plan:   pt eval- rec hhpt and rolling walker.   Anticipated DC Date:  09/20/2014   Anticipated DC Plan:  [place]  CM consult      [facility] Choice  HOME HEALTH   Choice offered to / List presented to:  C-3 Spouse   DME arranged  [place]      DME agency  [place] arranged  [place].   Status of service:  Completed, signed off Medicare Important Message given?  YES (If response is "NO", the following Medicare IM given date fields will be blank) Date Medicare IM given:  09/19/2014 Medicare IM given by:  Tomi Bamberger Date Additional Medicare IM given:   Additional Medicare IM given by:    Discharge Disposition:  [place]  Per UR Regulation:    If discussed at Long Length of Stay Meetings, dates discussed:    Comments:  09/20/14 10:40 CM called [facility] DME rep, Jeneen Rinks to please deliver the rolling walker to room prior to discharge.  [facility] will render HHPT services.  No other CM needs were communicated.  Mariane Masters, BSN, [place].  09/19/14 [place], BSN [phone] patient lives with spouse, they chose [facility] for hhpt and rolling walker, referral made to [facility] for hhpt, [facility] notified.  the DME rep for [facility] for Rolling [place] Rile will have to bring up to room on day of dc.

## 2014-09-19 NOTE — Progress Notes (Signed)
ANTICOAGULATION CONSULT NOTE - Follow Up Consult  Pharmacy Consult for Coumadin Indication: atrial fibrillation  Allergies  Allergen Reactions  . Cefpodoxime Proxetil Other (See Comments)    unknown    Patient Measurements: Height: 5\' 7"  (170.2 cm) Weight: 151 lb 3.8 oz (68.6 kg) IBW/kg (Calculated) : 66.1 Heparin Dosing Weight:   Vital Signs: Temp: 97.7 F (36.5 C) (01/29 1300) Temp Source: Oral (01/29 1300) BP: 125/65 mmHg (01/29 1300) Pulse Rate: 80 (01/29 1302)  Labs:  Recent Labs  09/17/14 1900 09/17/14 1950 09/18/14 0500 09/18/14 0505 09/18/14 1138 09/19/14 0535  HGB 14.6  --   --  12.3*  --  11.9*  HCT 40.8  --   --  35.0*  --  33.9*  PLT 38*  --   --  42*  --  47*  LABPROT  --  28.0*  --   --   --  29.5*  INR  --  2.59*  --   --   --  2.77*  CREATININE 1.16  --   --  1.04  --  0.93  TROPONINI  --   --  0.03  --  0.03  --     Estimated Creatinine Clearance: 49.4 mL/min (by C-G formula based on Cr of 0.93).   Medications:  Scheduled:  . guaiFENesin  600 mg Oral Daily  . levofloxacin (LEVAQUIN) IV  750 mg Intravenous Q48H  . levothyroxine  25 mcg Oral QAC breakfast  . pravastatin  40 mg Oral q1800  . vancomycin  1,000 mg Intravenous Q24H  . vitamin C  500 mg Oral Daily  . warfarin  4 mg Oral q1800  . Warfarin - Pharmacist Dosing Inpatient   Does not apply q1800    Assessment: 81yo male on Coumadin for AFib.  INR 2.77 this AM, up with addition of antibiotics.  No bleeding noted.  Pt is chronically pancytopenic.    Goal of Therapy:  INR 2-3 Monitor platelets by anticoagulation protocol: Yes   Plan:  Coumadin 2mg  po x 1 today, then resume 4mg  daily Daily INR  Gracy Bruins, PharmD Clinical Pharmacist [facility]

## 2014-09-20 LAB — BASIC METABOLIC PANEL
ANION GAP: 4 — AB (ref 5–15)
BUN: 11 mg/dL (ref 6–23)
CO2: 27 mmol/L (ref 19–32)
CREATININE: 0.98 mg/dL (ref 0.50–1.35)
Calcium: 8 mg/dL — ABNORMAL LOW (ref 8.4–10.5)
Chloride: 106 mmol/L (ref 96–112)
GFR calc Af Amer: 81 mL/min — ABNORMAL LOW (ref >90)
GFR, EST NON AFRICAN AMERICAN: 70 mL/min — AB (ref >90)
Glucose, Bld: 84 mg/dL (ref 70–99)
Potassium: 3.7 mmol/L (ref 3.5–5.1)
Sodium: 137 mmol/L (ref 135–145)

## 2014-09-20 LAB — CBC
HCT: 35.5 % — ABNORMAL LOW (ref 39.0–52.0)
Hemoglobin: 12.4 g/dL — ABNORMAL LOW (ref 13.0–17.0)
MCH: 31.1 pg (ref 26.0–34.0)
MCHC: 34.9 g/dL (ref 30.0–36.0)
MCV: 89 fL (ref 78.0–100.0)
PLATELETS: 53 10*3/uL — AB (ref 150–400)
RBC: 3.99 MIL/uL — ABNORMAL LOW (ref 4.22–5.81)
RDW: 15.2 % (ref 11.5–15.5)
WBC: 0.7 10*3/uL — CL (ref 4.0–10.5)

## 2014-09-20 LAB — PROTIME-INR
INR: 2.76 — ABNORMAL HIGH (ref 0.00–1.49)
Prothrombin Time: 29.4 seconds — ABNORMAL HIGH (ref 11.6–15.2)

## 2014-09-20 MED ORDER — METOPROLOL TARTRATE 50 MG PO TABS
50.0000 mg | ORAL_TABLET | Freq: Every day | ORAL | Status: DC
  Administered 2014-09-20: 50 mg via ORAL
  Filled 2014-09-20: qty 1

## 2014-09-20 MED ORDER — METOPROLOL TARTRATE 25 MG PO TABS
50.0000 mg | ORAL_TABLET | Freq: Every day | ORAL | Status: DC
  Filled 2014-09-20: qty 2

## 2014-09-20 MED ORDER — LEVOFLOXACIN 750 MG PO TABS: 750.0000 mg | ORAL_TABLET | ORAL | Status: DC

## 2014-09-20 MED ORDER — WARFARIN SODIUM 2 MG PO TABS
2.0000 mg | ORAL_TABLET | Freq: Once | ORAL | Status: DC
  Filled 2014-09-20: qty 1

## 2014-09-20 MED ORDER — METOPROLOL TARTRATE 25 MG PO TABS
25.0000 mg | ORAL_TABLET | Freq: Every day | ORAL | Status: DC
  Filled 2014-09-20: qty 1

## 2014-09-20 MED ORDER — WARFARIN SODIUM 4 MG PO TABS: 4.0000 mg | ORAL_TABLET | Freq: Every day | ORAL | Status: DC

## 2014-09-20 MED ORDER — WARFARIN SODIUM 2 MG PO TABS: 2.0000 mg | ORAL_TABLET | Freq: Once | ORAL | Status: DC

## 2014-09-20 NOTE — Discharge Summary (Signed)
Physician Discharge Summary  Kevin Rubio:[id number] DOB: 1922/07/06 DOA: 09/17/2014  PCP: Nyoka Cowden, MD  Admit date: 09/17/2014 Discharge date: 09/20/2014  Time spent: 35 minutes  Recommendations for Outpatient Follow-up:  1. Monitor INR closely while finishing levaquin (placed on 1/2 dose for the next 5 days)  Discharge Diagnoses:  Principal Problem:   Sepsis due to pneumonia Active Problems:   Neutropenia   Essential hypertension   CAD (coronary artery disease)   Atrial fibrillation   Long-term (current) use of anticoagulants   Felty's syndrome   Hypothyroidism   Blood poisoning   Discharge Condition: improved  Diet recommendation: cardiac  Filed Weights   09/18/14 0354 09/19/14 0544 09/20/14 0448  Weight: 68.4 kg (150 lb 12.7 oz) 68.6 kg (151 lb 3.8 oz) 66.7 kg (147 lb 0.8 oz)    History of present illness:  Kevin Rubio is a 81 y.o. male with Past medical history of chronic pancytopenia, rheumatoid arthritis, coronary artery disease, hypertension, A. fib on anticoagulation, chronic diastolic dysfunction. The patient presented with complaints of tachycardia and generalized weakness. Patient was recently placed on antibiotic a month ago for skin infection which improved. He was noted to have some bleeding from the left ear which also improved.  Earlier this morning when patient woke up he was feeling tired and fatigued and he felt that his heart was racing too fast. He had 3 episodes of loose watery bowel movement without any blood or black color bowel movement. He denies any chest pain to me and complains that he felt that his heart was racing on and off. He also complains of some dizziness. He has a chronic cough for last 3 weeks which was thought to be viral in nature. At the time of my evaluation patient denies any chest pain, nausea, vomiting, dizziness, lightheadedness, fever, burning urination. There was no recent change in his medication  reported.  The patient is coming from ALF. And at his baseline independent for most of his ADL  Hospital Course:  Sepsis due to pneumonia: hemodynamics improved.  -home O2 eval- dit not drop below 90% with activity  Pneumonia: treat 8 days (levaquin)  Essential hypertension: meds held  CAD (coronary artery disease)  Atrial fibrillation: Cardiologist is Dr. Wynonia Lawman. Sees a hematologist who has okayed Coumadin. No evidence of bleeding.  Long-term (current) use of anticoagulants  Felty's syndrome/pancytopenia: counts about baseline  Hypothyroidism No further loose stool. c diff neg  Procedures:    Consultations:    Discharge Exam: Filed Vitals:   09/20/14 0617  BP: 123/70  Pulse: 97  Temp: 97.4 F (36.3 C)  Resp: 20    General: A+Ox3, NAD Cardiovascular: rrr Respiratory: clear  Discharge Instructions   Discharge Instructions    Diet - low sodium heart healthy    Complete by:  As directed      Discharge instructions    Complete by:  As directed   Home health PT/INR on Wednesday- you are taking levaquin (for your pneumonia) which can interfere with your coumadin- will be placing you on half the dose of coumadin while taking the levaquin     Increase activity slowly    Complete by:  As directed           Current Discharge Medication List    START taking these medications   Details  levofloxacin (LEVAQUIN) 750 MG tablet Take 1 tablet (750 mg total) by mouth every other day. Qty: 2 tablet, Refills: 0  CONTINUE these medications which have CHANGED   Details  warfarin (COUMADIN) 2 MG tablet Take 1 tablet (2 mg total) by mouth one time only at 6 PM. Qty: 5 tablet, Refills: 0      CONTINUE these medications which have NOT CHANGED   Details  ascorbic Acid (VITAMIN C) 500 MG CPCR Take 500 mg by mouth daily.      cyanocobalamin (,VITAMIN B-12,) 1000 MCG/ML injection Inject 1,000 mcg into the muscle every 30 (thirty) days.    Ferrous Sulfate (IRON  CR PO) Take 65 mg by mouth daily.      guaiFENesin (MUCINEX) 600 MG 12 hr tablet Take 600 mg by mouth daily.     levothyroxine (SYNTHROID, LEVOTHROID) 25 MCG tablet Take 1 tablet (25 mcg total) by mouth daily before breakfast. Qty: 90 tablet, Refills: 1    metoprolol tartrate (LOPRESSOR) 25 MG tablet Take 25-50 mg by mouth 2 (two) times daily. Two tablets in the morning, and one tablet in the afternoon    Multiple Vitamins-Minerals (CENTRUM SILVER PO) Take 1 tablet by mouth daily.     Omega-3 Fatty Acids (FISH OIL) 1000 MG CAPS Take 2 capsules by mouth daily.      pravastatin (PRAVACHOL) 40 MG tablet TAKE ONE TABLET BY MOUTH ONCE DAILY Qty: 90 tablet, Refills: 1    ofloxacin (FLOXIN) 0.3 % otic solution Place 5 drops into the left ear 2 (two) times daily. Qty: 5 mL, Refills: 0      STOP taking these medications     amoxicillin-clavulanate (AUGMENTIN) 875-125 MG per tablet        Allergies  Allergen Reactions  . Cefpodoxime Proxetil Other (See Comments)    unknown   Follow-up Information    Follow up with [facility].   Why:  hhpt   Contact information:   [address] [place] [address] [phone]       Follow up with [facility].   Why:  rolling walker   Contact information:   [place] [address] [phone]       Follow up with Nyoka Cowden, MD In 1 week.   Specialty:  Internal Medicine   Why:  needs to get INR check on Wed as patient on coumadin and levaquin   Contact information:   [place] [address] [phone]        The results of significant diagnostics from this hospitalization (including imaging, microbiology, ancillary and laboratory) are listed below for reference.    Significant Diagnostic Studies: Dg Chest Port 1 View  09/17/2014   CLINICAL DATA:  Chest pain, shortness of breath  EXAM: PORTABLE CHEST - 1 VIEW  COMPARISON:  01/25/2011   FINDINGS: Normal heart size. No pleural effusion identified. Lung volumes are low and there are diffuse coarsened interstitial markings noted bilaterally. Airspace opacity within the right lung base is identified. Left lung appears clear.  IMPRESSION: 1. Right base opacity, suspicious for pneumonia.   Electronically Signed   By: Kerby Moors M.D.   On: 09/17/2014 19:43    Microbiology: Recent Results (from the past 240 hour(s))  Urine culture     Status: None   Collection Time: 09/17/14  7:49 PM  Result Value Ref Range Status   Specimen Description URINE, CLEAN CATCH  Final   Special Requests NONE  Final   Colony Count   Final    10,000 COLONIES/ML Performed at [facility]  Culture   Final    Multiple bacterial morphotypes present, none predominant. Suggest appropriate recollection if clinically indicated. Performed at [facility]    Report Status 09/18/2014 FINAL  Final  Blood Culture (routine x 2)     Status: None (Preliminary result)   Collection Time: 09/17/14  7:50 PM  Result Value Ref Range Status   Specimen Description BLOOD FOREARM RIGHT  Final   Special Requests BOTTLES DRAWN AEROBIC AND ANAEROBIC 5CC  Final   Culture   Final           BLOOD CULTURE RECEIVED NO GROWTH TO DATE CULTURE WILL BE HELD FOR 5 DAYS BEFORE ISSUING A FINAL NEGATIVE REPORT Performed at [facility]    Report Status PENDING  Incomplete  Blood Culture (routine x 2)     Status: None (Preliminary result)   Collection Time: 09/17/14  7:52 PM  Result Value Ref Range Status   Specimen Description BLOOD ARM LEFT  Final   Special Requests BOTTLES DRAWN AEROBIC AND ANAEROBIC 5CC  Final   Culture   Final           BLOOD CULTURE RECEIVED NO GROWTH TO DATE CULTURE WILL BE HELD FOR 5 DAYS BEFORE ISSUING A FINAL NEGATIVE REPORT Performed at [facility]    Report Status PENDING  Incomplete  MRSA PCR Screening     Status: None   Collection Time: 09/17/14 11:45 PM  Result  Value Ref Range Status   MRSA by PCR NEGATIVE NEGATIVE Final    Comment:        The GeneXpert MRSA Assay (FDA approved for NASAL specimens only), is one component of a comprehensive MRSA colonization surveillance program. It is not intended to diagnose MRSA infection nor to guide or monitor treatment for MRSA infections.   Clostridium Difficile by PCR     Status: None   Collection Time: 09/18/14 12:43 PM  Result Value Ref Range Status   C difficile by pcr NEGATIVE NEGATIVE Final     Labs: Basic Metabolic Panel:  Recent Labs Lab 09/17/14 1900 09/18/14 0505 09/19/14 0535 09/20/14 0445  NA 133* 137 137 137  K 4.2 3.7 3.7 3.7  CL 105 108 109 106  CO2 21 25 24 27   GLUCOSE 121* 94 88 84  BUN 16 13 10 11   CREATININE 1.16 1.04 0.93 0.98  CALCIUM 8.5 7.7* 8.0* 8.0*   Liver Function Tests:  Recent Labs Lab 09/17/14 1900 09/18/14 0505  AST 32 22  ALT 15 13  ALKPHOS 103 75  BILITOT 1.2 0.9  PROT 7.7 6.5  ALBUMIN 2.9* 2.3*   No results for input(s): LIPASE, AMYLASE in the last 168 hours. No results for input(s): AMMONIA in the last 168 hours. CBC:  Recent Labs Lab 09/17/14 1900 09/18/14 0505 09/19/14 0535 09/20/14 0445  WBC 1.5* 0.8* 0.7* 0.7*  NEUTROABS  --  0.1* 0.1*  --   HGB 14.6 12.3* 11.9* 12.4*  HCT 40.8 35.0* 33.9* 35.5*  MCV 88.5 90.0 89.0 89.0  PLT 38* 42* 47* 53*   Cardiac Enzymes:  Recent Labs Lab 09/18/14 0500 09/18/14 1138  TROPONINI 0.03 0.03   BNP: BNP (last 3 results) No results for input(s): PROBNP in the last 8760 hours. CBG: No results for input(s): GLUCAP in the last 168 hours.     SignedEulogio Bear  [facility] 09/20/2014, 2:33 PM

## 2014-09-20 NOTE — Discharge Instructions (Signed)

## 2014-09-20 NOTE — Progress Notes (Signed)
NURSING PROGRESS NOTE  Kevin Rubio [id number] Discharge Data: 09/20/2014 3:53 PM Attending Provider: Geradine Girt, DO OPF:[name],[name], MD     [name] to be D/C'd Home per MD order.  Discussed with the patient the After Visit Summary and all questions fully answered. All IV's discontinued with no bleeding noted. All belongings returned to patient for patient to take home.   Last Vital Signs:  Blood pressure 105/66, pulse 75, temperature 97.9 F (36.6 C), temperature source Oral, resp. rate 20, height 5\' 7"  (1.702 m), weight 66.7 kg (147 lb 0.8 oz), SpO2 95 %.  Discharge Medication List   Medication List    STOP taking these medications        amoxicillin-clavulanate 875-125 MG per tablet  Commonly known as:  AUGMENTIN      TAKE these medications        ascorbic Acid 500 MG Cpcr  Commonly known as:  VITAMIN C  Take 500 mg by mouth daily.     CENTRUM SILVER PO  Take 1 tablet by mouth daily.     cyanocobalamin 1000 MCG/ML injection  Commonly known as:  (VITAMIN B-12)  Inject 1,000 mcg into the muscle every 30 (thirty) days.     Fish Oil 1000 MG Caps  Take 2 capsules by mouth daily.     guaiFENesin 600 MG 12 hr tablet  Commonly known as:  MUCINEX  Take 600 mg by mouth daily.     IRON CR PO  Take 65 mg by mouth daily.     levofloxacin 750 MG tablet  Commonly known as:  LEVAQUIN  Take 1 tablet (750 mg total) by mouth every other day.     levothyroxine 25 MCG tablet  Commonly known as:  SYNTHROID, LEVOTHROID  Take 1 tablet (25 mcg total) by mouth daily before breakfast.     metoprolol tartrate 25 MG tablet  Commonly known as:  LOPRESSOR  Take 25-50 mg by mouth 2 (two) times daily. Two tablets in the morning, and one tablet in the afternoon     ofloxacin 0.3 % otic solution  Commonly known as:  FLOXIN  Place 5 drops into the left ear 2 (two) times daily.     pravastatin 40 MG tablet  Commonly known as:  PRAVACHOL  TAKE ONE TABLET BY MOUTH  ONCE DAILY     warfarin 2 MG tablet  Commonly known as:  COUMADIN  Take 1 tablet (2 mg total) by mouth one time only at 6 PM.

## 2014-09-20 NOTE — Progress Notes (Signed)
ANTICOAGULATION CONSULT NOTE - Follow Up Consult  Pharmacy Consult for Coumadin Indication: atrial fibrillation  Allergies  Allergen Reactions  . Cefpodoxime Proxetil Other (See Comments)    unknown    Patient Measurements: Height: 5\' 7"  (170.2 cm) Weight: 147 lb 0.8 oz (66.7 kg) IBW/kg (Calculated) : 66.1 Heparin Dosing Weight:   Vital Signs: Temp: 97.4 F (36.3 C) (01/30 0617) Temp Source: Oral (01/30 0617) BP: 123/70 mmHg (01/30 0617) Pulse Rate: 97 (01/30 0617)  Labs:  Recent Labs  09/17/14 1950 09/18/14 0500 09/18/14 0505 09/18/14 1138 09/19/14 0535 09/20/14 0445  HGB  --   --  12.3*  --  11.9* 12.4*  HCT  --   --  35.0*  --  33.9* 35.5*  PLT  --   --  42*  --  47* 53*  LABPROT 28.0*  --   --   --  29.5* 29.4*  INR 2.59*  --   --   --  2.77* 2.76*  CREATININE  --   --  1.04  --  0.93 0.98  TROPONINI  --  0.03  --  0.03  --   --     Estimated Creatinine Clearance: 46.8 mL/min (by C-G formula based on Cr of 0.98).   Medications:  Scheduled:  . guaiFENesin  600 mg Oral Daily  . levofloxacin (LEVAQUIN) IV  750 mg Intravenous Q48H  . levothyroxine  25 mcg Oral QAC breakfast  . metoprolol tartrate  50 mg Oral Daily  . metoprolol tartrate  25 mg Oral QHS  . pravastatin  40 mg Oral q1800  . vancomycin  1,000 mg Intravenous Q24H  . vitamin C  500 mg Oral Daily  . warfarin  4 mg Oral q1800  . Warfarin - Pharmacist Dosing Inpatient   Does not apply q1800    Assessment: 81yo male with AFib.  INR up some since admission, on antibiotics; stable at 2.76 this AM with reduced dose on 1/29.  Hg stable and pltc chronically low.  No bleeding issues noted.    Goal of Therapy:  INR 2-3 Monitor platelets by anticoagulation protocol: Yes   Plan:  Coumadin 2mg  today, then resume 4mg  daily on 1/31 Daily INR Watch for s/s of bleeding  Gracy Bruins, PharmD Clinical Pharmacist [facility]

## 2014-09-20 NOTE — Progress Notes (Signed)
Pt HR 170's with activities 130's - 150's at rest, no c/o sob or cp. MD notified, new orders given see MAR.

## 2014-09-24 LAB — CULTURE, BLOOD (ROUTINE X 2)
Culture: NO GROWTH
Culture: NO GROWTH

## 2014-09-29 ENCOUNTER — Ambulatory Visit (INDEPENDENT_AMBULATORY_CARE_PROVIDER_SITE_OTHER): Payer: Medicare Other | Admitting: General Practice

## 2014-09-29 ENCOUNTER — Encounter: Payer: Self-pay | Admitting: Internal Medicine

## 2014-09-29 ENCOUNTER — Telehealth: Payer: Self-pay | Admitting: General Practice

## 2014-09-29 ENCOUNTER — Ambulatory Visit (INDEPENDENT_AMBULATORY_CARE_PROVIDER_SITE_OTHER): Payer: Medicare Other | Admitting: Internal Medicine

## 2014-09-29 VITALS — BP 110/68 | HR 63 | Temp 97.4°F | Resp 20 | Ht 67.0 in | Wt 148.0 lb

## 2014-09-29 DIAGNOSIS — J189 Pneumonia, unspecified organism: Secondary | ICD-10-CM

## 2014-09-29 DIAGNOSIS — A419 Sepsis, unspecified organism: Secondary | ICD-10-CM | POA: Diagnosis not present

## 2014-09-29 DIAGNOSIS — I480 Paroxysmal atrial fibrillation: Secondary | ICD-10-CM | POA: Diagnosis not present

## 2014-09-29 DIAGNOSIS — I10 Essential (primary) hypertension: Secondary | ICD-10-CM | POA: Diagnosis not present

## 2014-09-29 DIAGNOSIS — I2510 Atherosclerotic heart disease of native coronary artery without angina pectoris: Secondary | ICD-10-CM

## 2014-09-29 DIAGNOSIS — Z7901 Long term (current) use of anticoagulants: Secondary | ICD-10-CM | POA: Diagnosis not present

## 2014-09-29 DIAGNOSIS — I4891 Unspecified atrial fibrillation: Secondary | ICD-10-CM

## 2014-09-29 LAB — POCT INR: INR: 1.9

## 2014-09-29 NOTE — Patient Instructions (Signed)
Limit your sodium (Salt) intake  Increase exercise activity  Return in 3 months for follow-up

## 2014-09-29 NOTE — Telephone Encounter (Signed)
Spoke with patient's wife and instructed her to have patient only take 1/2 the dosage of coumadin for 5 days or until finished with Levaquin, per Dr. Burnice Logan.   Follow up with Dr. Wynonia Lawman for INR check.

## 2014-09-29 NOTE — Progress Notes (Signed)
Pre visit review using our clinic review tool, if applicable. No additional management support is needed unless otherwise documented below in the visit note. 

## 2014-09-29 NOTE — Progress Notes (Signed)
Subjective:    Patient ID: Kevin Rubio, male    DOB: [birth year], 81 y.o.   MRN: [id number]  HPI  77 -year-old patient who is seen today following a recent hospital discharge  Admit date: 09/17/2014 Discharge date: 09/20/2014   Recommendations for Outpatient Follow-up:  1. Monitor INR closely while finishing levaquin (placed on 1/2 dose for the next 5 days)  Discharge Diagnoses:  Principal Problem:  Sepsis due to pneumonia Active Problems:  Neutropenia  Essential hypertension  CAD (coronary artery disease)  Atrial fibrillation  Long-term (current) use of anticoagulants  Felty's syndrome  Hypothyroidism  Blood poisoning   Since his discharge, he has done quite well.  His appetite has normalized and he seems to be getting stronger.  Denies any cough or shortness of breath.  He has had no fever.  He has completed antibiotic therapy  Past Medical History  Diagnosis Date  . ANEMIA, PERNICIOUS 05/14/2007  . Atrial fibrillation 08/08/2007  . BENIGN PROSTATIC HYPERTROPHY 03/05/2007  . CORONARY ARTERY DISEASE 03/05/2007  . DYSPLASTIC NEVUS 01/24/2008  . HYPERLIPIDEMIA 03/05/2007  . HYPERTENSION 03/05/2007  . MYOCARDIAL INFARCTION, HX OF 03/05/2007    1982  . NEUTROPENIA NOS 05/11/2007  . Rheumatoid arthritis(714.0) 03/05/2007  . VITAMIN B12 DEFICIENCY 08/08/2007  . Current use of long term anticoagulation   . Rheumatoid arthritis(714.0)   . Hernia     umbilical    History   Social History  . Marital Status: Married    Spouse Name: N/A    Number of Children: N/A  . Years of Education: N/A   Occupational History  . Not on file.   Social History Main Topics  . Smoking status: Former Smoker    Types: Cigarettes    Quit date: 08/22/1978  . Smokeless tobacco: Never Used  . Alcohol Use: No  . Drug Use: No  . Sexual Activity: Not on file   Other Topics Concern  . Not on file   Social History Narrative    Past Surgical History  Procedure Laterality Date    . Laparoscopic cholecystectomy  11/09/2001    Dr Lennie Hummer  . Nasal polyp surgery    . Ptca  15 yrs ago  . External ear surgery  2 years ago  . Laparoscopic inguinal hernia repair  01/27/11    right direct, Dr Greer Pickerel  . Hernia repair  2012    No family history on file.  Allergies  Allergen Reactions  . Cefpodoxime Proxetil Other (See Comments)    unknown    Current Outpatient Prescriptions on File Prior to Visit  Medication Sig Dispense Refill  . ascorbic Acid (VITAMIN C) 500 MG CPCR Take 500 mg by mouth daily.      . cyanocobalamin (,VITAMIN B-12,) 1000 MCG/ML injection Inject 1,000 mcg into the muscle every 30 (thirty) days.    . Ferrous Sulfate (IRON CR PO) Take 65 mg by mouth daily.      Marland Kitchen guaiFENesin (MUCINEX) 600 MG 12 hr tablet Take 600 mg by mouth daily.     Marland Kitchen levothyroxine (SYNTHROID, LEVOTHROID) 25 MCG tablet Take 1 tablet (25 mcg total) by mouth daily before breakfast. 90 tablet 1  . metoprolol tartrate (LOPRESSOR) 25 MG tablet Take 25-50 mg by mouth 2 (two) times daily. Two tablets in the morning, and one tablet in the afternoon    . Multiple Vitamins-Minerals (CENTRUM SILVER PO) Take 1 tablet by mouth daily.     Marland Kitchen ofloxacin (FLOXIN) 0.3 % otic  solution Place 5 drops into the left ear 2 (two) times daily. 5 mL 0  . Omega-3 Fatty Acids (FISH OIL) 1000 MG CAPS Take 2 capsules by mouth daily.      . pravastatin (PRAVACHOL) 40 MG tablet TAKE ONE TABLET BY MOUTH ONCE DAILY 90 tablet 1  . warfarin (COUMADIN) 2 MG tablet Take 1 tablet (2 mg total) by mouth one time only at 6 PM. (Patient taking differently: Take 4 mg by mouth one time only at 6 PM. ) 5 tablet 0   No current facility-administered medications on file prior to visit.    BP 110/68 mmHg  Pulse 63  Temp(Src) 97.4 F (36.3 C) (Oral)  Resp 20  Ht 5\' 7"  (1.702 m)  Wt 148 lb (67.132 kg)  BMI 23.17 kg/m2  SpO2 93%     Review of Systems  Constitutional: Negative for fever, chills, appetite change and  fatigue.  HENT: Negative for congestion, dental problem, ear pain, hearing loss, sore throat, tinnitus, trouble swallowing and voice change.   Eyes: Negative for pain, discharge and visual disturbance.  Respiratory: Negative for cough, chest tightness, wheezing and stridor.   Cardiovascular: Negative for chest pain, palpitations and leg swelling.  Gastrointestinal: Negative for nausea, vomiting, abdominal pain, diarrhea, constipation, blood in stool and abdominal distention.  Genitourinary: Negative for urgency, hematuria, flank pain, discharge, difficulty urinating and genital sores.  Musculoskeletal: Positive for arthralgias and gait problem. Negative for myalgias, back pain, joint swelling and neck stiffness.  Skin: Negative for rash.  Neurological: Positive for weakness. Negative for dizziness, syncope, speech difficulty, numbness and headaches.  Hematological: Negative for adenopathy. Does not bruise/bleed easily.  Psychiatric/Behavioral: Negative for behavioral problems and dysphoric mood. The patient is not nervous/anxious.        Objective:   Physical Exam  Constitutional: He is oriented to person, place, and time. He appears well-developed.  Elderly No distress Afebrile Blood pressure 110/70  HENT:  Head: Normocephalic.  Right Ear: External ear normal.  Left Ear: External ear normal.  Eyes: Conjunctivae and EOM are normal.  Neck: Normal range of motion.  Cardiovascular: Normal rate and normal heart sounds.   Pulmonary/Chest: Breath sounds normal.  A few crackles right base  Abdominal: Bowel sounds are normal.  Musculoskeletal: Normal range of motion. He exhibits no edema or tenderness.  Neurological: He is alert and oriented to person, place, and time.  Psychiatric: He has a normal mood and affect. His behavior is normal.          Assessment & Plan:   Status post pneumonia with sepsis syndrome, stable Chronic Coumadin anticoagulation.  Will check a INR Coronary  artery disease, stable Essential hypertension, well-controlled RA

## 2014-10-09 ENCOUNTER — Ambulatory Visit (INDEPENDENT_AMBULATORY_CARE_PROVIDER_SITE_OTHER): Payer: Medicare Other | Admitting: *Deleted

## 2014-10-09 DIAGNOSIS — E538 Deficiency of other specified B group vitamins: Secondary | ICD-10-CM

## 2014-10-09 MED ORDER — CYANOCOBALAMIN 1000 MCG/ML IJ SOLN
1000.0000 ug | Freq: Once | INTRAMUSCULAR | Status: AC
  Administered 2014-10-09: 1000 ug via INTRAMUSCULAR

## 2014-10-11 ENCOUNTER — Other Ambulatory Visit: Payer: Self-pay | Admitting: Internal Medicine

## 2014-10-14 DIAGNOSIS — Z7901 Long term (current) use of anticoagulants: Secondary | ICD-10-CM | POA: Diagnosis not present

## 2014-11-07 ENCOUNTER — Ambulatory Visit (INDEPENDENT_AMBULATORY_CARE_PROVIDER_SITE_OTHER): Payer: Medicare Other | Admitting: *Deleted

## 2014-11-07 DIAGNOSIS — E538 Deficiency of other specified B group vitamins: Secondary | ICD-10-CM

## 2014-11-10 ENCOUNTER — Ambulatory Visit: Payer: Medicare Other | Admitting: Internal Medicine

## 2014-11-10 MED ORDER — CYANOCOBALAMIN 1000 MCG/ML IJ SOLN
1000.0000 ug | Freq: Once | INTRAMUSCULAR | Status: AC
  Administered 2014-11-07: 1000 ug via INTRAMUSCULAR

## 2014-11-11 DIAGNOSIS — Z7901 Long term (current) use of anticoagulants: Secondary | ICD-10-CM | POA: Diagnosis not present

## 2014-11-28 ENCOUNTER — Other Ambulatory Visit: Payer: Self-pay | Admitting: Internal Medicine

## 2014-12-08 ENCOUNTER — Ambulatory Visit (INDEPENDENT_AMBULATORY_CARE_PROVIDER_SITE_OTHER): Payer: Medicare Other | Admitting: *Deleted

## 2014-12-08 DIAGNOSIS — E538 Deficiency of other specified B group vitamins: Secondary | ICD-10-CM

## 2014-12-08 MED ORDER — CYANOCOBALAMIN 1000 MCG/ML IJ SOLN
1000.0000 ug | Freq: Once | INTRAMUSCULAR | Status: AC
  Administered 2014-12-08: 1000 ug via INTRAMUSCULAR

## 2014-12-09 DIAGNOSIS — Z7901 Long term (current) use of anticoagulants: Secondary | ICD-10-CM | POA: Diagnosis not present

## 2014-12-09 DIAGNOSIS — I2510 Atherosclerotic heart disease of native coronary artery without angina pectoris: Secondary | ICD-10-CM | POA: Diagnosis not present

## 2014-12-09 DIAGNOSIS — I482 Chronic atrial fibrillation: Secondary | ICD-10-CM | POA: Diagnosis not present

## 2014-12-09 DIAGNOSIS — E785 Hyperlipidemia, unspecified: Secondary | ICD-10-CM | POA: Diagnosis not present

## 2014-12-09 DIAGNOSIS — I480 Paroxysmal atrial fibrillation: Secondary | ICD-10-CM | POA: Diagnosis not present

## 2014-12-09 DIAGNOSIS — I252 Old myocardial infarction: Secondary | ICD-10-CM | POA: Diagnosis not present

## 2014-12-15 ENCOUNTER — Other Ambulatory Visit: Payer: Self-pay | Admitting: Internal Medicine

## 2014-12-15 DIAGNOSIS — J322 Chronic ethmoidal sinusitis: Secondary | ICD-10-CM | POA: Diagnosis not present

## 2014-12-15 DIAGNOSIS — J040 Acute laryngitis: Secondary | ICD-10-CM | POA: Diagnosis not present

## 2014-12-15 DIAGNOSIS — J320 Chronic maxillary sinusitis: Secondary | ICD-10-CM

## 2014-12-15 DIAGNOSIS — J410 Simple chronic bronchitis: Secondary | ICD-10-CM | POA: Diagnosis not present

## 2014-12-15 DIAGNOSIS — H6121 Impacted cerumen, right ear: Secondary | ICD-10-CM | POA: Diagnosis not present

## 2014-12-15 DIAGNOSIS — J321 Chronic frontal sinusitis: Secondary | ICD-10-CM

## 2014-12-22 DIAGNOSIS — J320 Chronic maxillary sinusitis: Secondary | ICD-10-CM | POA: Diagnosis not present

## 2014-12-22 DIAGNOSIS — R040 Epistaxis: Secondary | ICD-10-CM | POA: Diagnosis not present

## 2014-12-22 DIAGNOSIS — J189 Pneumonia, unspecified organism: Secondary | ICD-10-CM | POA: Diagnosis not present

## 2014-12-29 ENCOUNTER — Ambulatory Visit (INDEPENDENT_AMBULATORY_CARE_PROVIDER_SITE_OTHER): Payer: Medicare Other | Admitting: Internal Medicine

## 2014-12-29 ENCOUNTER — Encounter: Payer: Self-pay | Admitting: Internal Medicine

## 2014-12-29 VITALS — BP 118/70 | HR 64 | Temp 97.5°F | Resp 20 | Ht 67.0 in | Wt 150.0 lb

## 2014-12-29 DIAGNOSIS — I2510 Atherosclerotic heart disease of native coronary artery without angina pectoris: Secondary | ICD-10-CM

## 2014-12-29 DIAGNOSIS — D709 Neutropenia, unspecified: Secondary | ICD-10-CM | POA: Diagnosis not present

## 2014-12-29 DIAGNOSIS — I10 Essential (primary) hypertension: Secondary | ICD-10-CM | POA: Diagnosis not present

## 2014-12-29 DIAGNOSIS — I252 Old myocardial infarction: Secondary | ICD-10-CM

## 2014-12-29 DIAGNOSIS — M069 Rheumatoid arthritis, unspecified: Secondary | ICD-10-CM | POA: Diagnosis not present

## 2014-12-29 NOTE — Patient Instructions (Signed)
Limit your sodium (Salt) intake  Return in 6 months for follow-up  Cardiology followup as scheduled 

## 2014-12-29 NOTE — Progress Notes (Signed)
Subjective:    Patient ID: Kevin Rubio, male    DOB: 12/16/1921, 81 y.o.   MRN: [id number]  HPI  Wt Readings from Last 3 Encounters:  12/29/14 150 lb (68.04 kg)  09/29/14 148 lb (67.132 kg)  09/20/14 147 lb 0.8 oz (27.57 kg)   81 year old patient who is seen today for follow-up.  He is followed by cardiology with a history of atrial fibrillation and coronary artery disease.  Remains on Coumadin anticoagulation and is monitored monthly.  He has had a recent ENT evaluation with Dr. Ernesto Rutherford.  He has hypothyroidism and remains on statin therapy.  He is followed by oncology.  Due to autoimmune neutropenia.  He has rheumatoid arthritis.  In general doing well today without focal concerns or complaints.  Past Medical History  Diagnosis Date  . ANEMIA, PERNICIOUS 05/14/2007  . Atrial fibrillation 08/08/2007  . BENIGN PROSTATIC HYPERTROPHY 03/05/2007  . CORONARY ARTERY DISEASE 03/05/2007  . DYSPLASTIC NEVUS 01/24/2008  . HYPERLIPIDEMIA 03/05/2007  . HYPERTENSION 03/05/2007  . MYOCARDIAL INFARCTION, HX OF 03/05/2007    1982  . NEUTROPENIA NOS 05/11/2007  . Rheumatoid arthritis(714.0) 03/05/2007  . VITAMIN B12 DEFICIENCY 08/08/2007  . Current use of long term anticoagulation   . Rheumatoid arthritis(714.0)   . Hernia     umbilical    History   Social History  . Marital Status: Married    Spouse Name: N/A  . Number of Children: N/A  . Years of Education: N/A   Occupational History  . Not on file.   Social History Main Topics  . Smoking status: Former Smoker    Types: Cigarettes    Quit date: 08/22/1978  . Smokeless tobacco: Never Used  . Alcohol Use: No  . Drug Use: No  . Sexual Activity: Not on file   Other Topics Concern  . Not on file   Social History Narrative    Past Surgical History  Procedure Laterality Date  . Laparoscopic cholecystectomy  11/09/2001    Dr Lennie Hummer  . Nasal polyp surgery    . Ptca  15 yrs ago  . External ear surgery  2 years ago  . Laparoscopic  inguinal hernia repair  01/27/11    right direct, Dr Greer Pickerel  . Hernia repair  2012    No family history on file.  Allergies  Allergen Reactions  . Cefpodoxime Proxetil Other (See Comments)    unknown    Current Outpatient Prescriptions on File Prior to Visit  Medication Sig Dispense Refill  . ascorbic Acid (VITAMIN C) 500 MG CPCR Take 500 mg by mouth daily.      . cyanocobalamin (,VITAMIN B-12,) 1000 MCG/ML injection Inject 1,000 mcg into the muscle every 30 (thirty) days.    . Ferrous Sulfate (IRON CR PO) Take 65 mg by mouth daily.      Marland Kitchen guaiFENesin (MUCINEX) 600 MG 12 hr tablet Take 600 mg by mouth daily.     Marland Kitchen levothyroxine (SYNTHROID, LEVOTHROID) 25 MCG tablet TAKE ONE TABLET BY MOUTH ONCE DAILY BEFORE  BREAKFAST 90 tablet 1  . metoprolol tartrate (LOPRESSOR) 25 MG tablet Take 25-50 mg by mouth 2 (two) times daily. Two tablets in the morning, and one tablet in the afternoon    . Multiple Vitamins-Minerals (CENTRUM SILVER PO) Take 1 tablet by mouth daily.     Marland Kitchen ofloxacin (FLOXIN) 0.3 % otic solution Place 5 drops into the left ear 2 (two) times daily. 5 mL 0  . Omega-3 Fatty  Acids (FISH OIL) 1000 MG CAPS Take 2 capsules by mouth daily.      . pravastatin (PRAVACHOL) 40 MG tablet TAKE ONE TABLET BY MOUTH ONCE DAILY 90 tablet 1   No current facility-administered medications on file prior to visit.    BP 118/70 mmHg  Pulse 64  Temp(Src) 97.5 F (36.4 C) (Oral)  Resp 20  Ht 5\' 7"  (1.702 m)  Wt 150 lb (68.04 kg)  BMI 23.49 kg/m2  SpO2 97%      Review of Systems  Constitutional: Negative for fever, chills, appetite change and fatigue.  HENT: Negative for congestion, dental problem, ear pain, hearing loss, sore throat, tinnitus, trouble swallowing and voice change.   Eyes: Negative for pain, discharge and visual disturbance.  Respiratory: Negative for cough, chest tightness, wheezing and stridor.   Cardiovascular: Negative for chest pain, palpitations and leg swelling.   Gastrointestinal: Negative for nausea, vomiting, abdominal pain, diarrhea, constipation, blood in stool and abdominal distention.  Genitourinary: Negative for urgency, hematuria, flank pain, discharge, difficulty urinating and genital sores.  Musculoskeletal: Negative for myalgias, back pain, joint swelling, arthralgias, gait problem and neck stiffness.  Skin: Negative for rash.  Neurological: Negative for dizziness, syncope, speech difficulty, weakness, numbness and headaches.  Hematological: Negative for adenopathy. Does not bruise/bleed easily.  Psychiatric/Behavioral: Negative for behavioral problems and dysphoric mood. The patient is not nervous/anxious.        Objective:   Physical Exam  Constitutional: He is oriented to person, place, and time. He appears well-developed.  HENT:  Head: Normocephalic.  Right Ear: External ear normal.  Left Ear: External ear normal.  Eyes: Conjunctivae and EOM are normal.  Neck: Normal range of motion.  Cardiovascular: Normal rate and normal heart sounds.   Pulmonary/Chest: Breath sounds normal.  Abdominal: Bowel sounds are normal.  Musculoskeletal: Normal range of motion. He exhibits no edema or tenderness.  Rheumatoid hands  Neurological: He is alert and oriented to person, place, and time.  Psychiatric: He has a normal mood and affect. His behavior is normal.          Assessment & Plan:   Hypertension Coronary artery disease dyslipidemia.  Continue statin therapy RA  Continue Coumadin anticoagulation. Monthly INR  Follow-up cardiology, hematology Recheck here 6 months

## 2014-12-29 NOTE — Progress Notes (Signed)
Pre visit review using our clinic review tool, if applicable. No additional management support is needed unless otherwise documented below in the visit note. 

## 2015-01-06 DIAGNOSIS — Z7901 Long term (current) use of anticoagulants: Secondary | ICD-10-CM | POA: Diagnosis not present

## 2015-01-07 ENCOUNTER — Ambulatory Visit: Payer: Medicare Other | Admitting: Oncology

## 2015-01-07 ENCOUNTER — Ambulatory Visit (INDEPENDENT_AMBULATORY_CARE_PROVIDER_SITE_OTHER): Payer: Medicare Other | Admitting: *Deleted

## 2015-01-07 ENCOUNTER — Ambulatory Visit: Payer: Medicare Other | Admitting: *Deleted

## 2015-01-07 ENCOUNTER — Other Ambulatory Visit: Payer: Medicare Other

## 2015-01-07 DIAGNOSIS — E538 Deficiency of other specified B group vitamins: Secondary | ICD-10-CM

## 2015-01-07 MED ORDER — CYANOCOBALAMIN 1000 MCG/ML IJ SOLN
1000.0000 ug | Freq: Once | INTRAMUSCULAR | Status: AC
  Administered 2015-01-07: 1000 ug via INTRAMUSCULAR

## 2015-01-15 ENCOUNTER — Telehealth: Payer: Self-pay | Admitting: Oncology

## 2015-01-15 ENCOUNTER — Ambulatory Visit (HOSPITAL_BASED_OUTPATIENT_CLINIC_OR_DEPARTMENT_OTHER): Payer: Medicare Other | Admitting: Oncology

## 2015-01-15 ENCOUNTER — Other Ambulatory Visit (HOSPITAL_BASED_OUTPATIENT_CLINIC_OR_DEPARTMENT_OTHER): Payer: Medicare Other

## 2015-01-15 VITALS — BP 150/59 | HR 67 | Temp 97.6°F | Resp 19 | Ht 67.0 in | Wt 148.2 lb

## 2015-01-15 DIAGNOSIS — M069 Rheumatoid arthritis, unspecified: Secondary | ICD-10-CM

## 2015-01-15 DIAGNOSIS — D709 Neutropenia, unspecified: Secondary | ICD-10-CM

## 2015-01-15 DIAGNOSIS — D696 Thrombocytopenia, unspecified: Secondary | ICD-10-CM

## 2015-01-15 DIAGNOSIS — D61818 Other pancytopenia: Secondary | ICD-10-CM

## 2015-01-15 LAB — CBC WITH DIFFERENTIAL/PLATELET
BASO%: 0 % (ref 0.0–2.0)
Basophils Absolute: 0 10*3/uL (ref 0.0–0.1)
EOS%: 3.1 % (ref 0.0–7.0)
Eosinophils Absolute: 0 10*3/uL (ref 0.0–0.5)
HCT: 39.6 % (ref 38.4–49.9)
HEMOGLOBIN: 13.6 g/dL (ref 13.0–17.1)
LYMPH%: 39.1 % (ref 14.0–49.0)
MCH: 30.8 pg (ref 27.2–33.4)
MCHC: 34.3 g/dL (ref 32.0–36.0)
MCV: 89.8 fL (ref 79.3–98.0)
MONO#: 0.2 10*3/uL (ref 0.1–0.9)
MONO%: 31.3 % — ABNORMAL HIGH (ref 0.0–14.0)
NEUT#: 0.2 10*3/uL — CL (ref 1.5–6.5)
NEUT%: 26.5 % — ABNORMAL LOW (ref 39.0–75.0)
NRBC: 0 % (ref 0–0)
PLATELETS: 43 10*3/uL — AB (ref 140–400)
RBC: 4.41 10*6/uL (ref 4.20–5.82)
RDW: 14.9 % — AB (ref 11.0–14.6)
WBC: 0.6 10*3/uL — CL (ref 4.0–10.3)
lymph#: 0.3 10*3/uL — ABNORMAL LOW (ref 0.9–3.3)

## 2015-01-15 LAB — COMPREHENSIVE METABOLIC PANEL (CC13)
ALT: 13 U/L (ref 0–55)
ANION GAP: 7 meq/L (ref 3–11)
AST: 30 U/L (ref 5–34)
Albumin: 2.8 g/dL — ABNORMAL LOW (ref 3.5–5.0)
Alkaline Phosphatase: 108 U/L (ref 40–150)
BILIRUBIN TOTAL: 0.69 mg/dL (ref 0.20–1.20)
BUN: 15.3 mg/dL (ref 7.0–26.0)
CO2: 24 mEq/L (ref 22–29)
Calcium: 8.3 mg/dL — ABNORMAL LOW (ref 8.4–10.4)
Chloride: 105 mEq/L (ref 98–109)
Creatinine: 1.1 mg/dL (ref 0.7–1.3)
EGFR: 58 mL/min/{1.73_m2} — AB (ref >90)
Glucose: 106 mg/dl (ref 70–140)
Potassium: 4.3 mEq/L (ref 3.5–5.1)
SODIUM: 136 meq/L (ref 136–145)
TOTAL PROTEIN: 7.8 g/dL (ref 6.4–8.3)

## 2015-01-15 NOTE — Progress Notes (Signed)
Hematology and Oncology Follow Up Visit  Kevin Rubio [id number] 12-24-1921 81 y.o. 01/15/2015 8:39 AM  CC: Marletta Lor, MD  Ezzard Standing, M.D.  Minna Merritts, M.D.    Principle Diagnosis: This is an 81 year old gentleman with neutropenia and thrombocytopenia. Differential diagnosis include autoimmune in nature related to rheumatoid arthritis versus myelodysplasia.  Current therapy: Observation and surveillance. He is S/P bone marrow biopsy done in 06/2009 which did not show any abnormalities.  Interim History:  Mr. Linch presents today for a followup visit by himself. Since the last visit, he was hospitalized for pneumonia and sepsis in January 2016. He required intravenous antibiotics and recovered rather quickly. He has been doing well since his discharge 4 months ago. He has not reported any fevers, chills or sweats. He has not reported any bleeding.  He continues to live independently with his wife. He had  He had not had any epistaxis.  His performance status and activity level remain at reasonable range. He is able to drive short distances and continues to do so. He does not report any headaches blurred vision or seizures. Has not reported any chest pain shortness of breath or difficulty breathing. Has not reported any cough or hemoptysis. Has not reported any nausea or vomiting or abdominal pain. Has not reported any frequency urgency or hesitancy. Rest or view of system is unremarkable.  Medications: I have reviewed the patient's current medications.   Current Outpatient Prescriptions  Medication Sig Dispense Refill  . ascorbic Acid (VITAMIN C) 500 MG CPCR Take 500 mg by mouth daily.      . cyanocobalamin (,VITAMIN B-12,) 1000 MCG/ML injection Inject 1,000 mcg into the muscle every 30 (thirty) days.    . Ferrous Sulfate (IRON CR PO) Take 65 mg by mouth daily.      Marland Kitchen guaiFENesin (MUCINEX) 600 MG 12 hr tablet Take 600 mg by mouth daily.     Marland Kitchen levothyroxine (SYNTHROID,  LEVOTHROID) 25 MCG tablet TAKE ONE TABLET BY MOUTH ONCE DAILY BEFORE  BREAKFAST 90 tablet 1  . metoprolol tartrate (LOPRESSOR) 25 MG tablet Take 25-50 mg by mouth 2 (two) times daily. Two tablets in the morning, and one tablet in the afternoon    . Multiple Vitamins-Minerals (CENTRUM SILVER PO) Take 1 tablet by mouth daily.     Marland Kitchen ofloxacin (FLOXIN) 0.3 % otic solution Place 5 drops into the left ear 2 (two) times daily. 5 mL 0  . Omega-3 Fatty Acids (FISH OIL) 1000 MG CAPS Take 2 capsules by mouth daily.      Marland Kitchen warfarin (COUMADIN) 4 MG tablet Take 4 mg by mouth daily. EXCEPT ON THURS AND SUN TAKE ONE AND HALF TABLETS 6 MG.    . pravastatin (PRAVACHOL) 40 MG tablet TAKE ONE TABLET BY MOUTH ONCE DAILY 90 tablet 1   No current facility-administered medications for this visit.    Allergies:  Allergies  Allergen Reactions  . Cefpodoxime Proxetil Other (See Comments)    unknown    Past Medical History, Surgical history, Social history, and Family History were reviewed and updated.   Physical Exam: Blood pressure 150/59, pulse 67, temperature 97.6 F (36.4 C), temperature source Oral, resp. rate 19, height _0  (1.702 m), weight 148 lb 3.2 oz (67.223 kg), SpO2 94 %. ECOG: 1 General appearance: alert awake not in any distress. Head: Normocephalic, without obvious abnormality Neck: no adenopathy, no thyroid masses. Lymph nodes: Cervical, supraclavicular, and axillary nodes normal. Heart:regular rate and rhythm, S1, S2 normal,  no murmur, click, rub or gallop Lung:chest clear, no wheezing, rales, normal symmetric air entry Abdomin: soft, non-tender, without masses or organomegaly EXT:no erythema, induration, or nodules   Lab Results: Lab Results  Component Value Date   WBC 0.6* 01/15/2015   HGB 13.6 01/15/2015   HCT 39.6 01/15/2015   MCV 89.8 01/15/2015   PLT 43* 01/15/2015     Chemistry      Component Value Date/Time   NA 137 09/20/2014 0445   NA 134* 07/10/2014 0937   K 3.7  09/20/2014 0445   K 4.1 07/10/2014 0937   CL 106 09/20/2014 0445   CL 106 12/13/2012 0959   CO2 27 09/20/2014 0445   CO2 26 07/10/2014 0937   BUN 11 09/20/2014 0445   BUN 16.8 07/10/2014 0937   CREATININE 0.98 09/20/2014 0445   CREATININE 1.1 07/10/2014 0937      Component Value Date/Time   CALCIUM 8.0* 09/20/2014 0445   CALCIUM 9.0 07/10/2014 0937   ALKPHOS 75 09/18/2014 0505   ALKPHOS 107 07/10/2014 0937   AST 22 09/18/2014 0505   AST 27 07/10/2014 0937   ALT 13 09/18/2014 0505   ALT 12 07/10/2014 0937   BILITOT 0.9 09/18/2014 0505   BILITOT 0.80 07/10/2014 0937     Impression and Plan:  This is a pleasant 81 year old gentleman with the following issues: 1. 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  His counts are slightly lower from previous months and had an episode of pneumonia and sepsis. Options of treatment were discussed with the patient today including sterol rides, growth factor support and maybe repeat the bone marrow biopsy. At the time being, he is clinically stable and I fear that some of these intervention might be worse than his actual disease. He is not a candidate for treatment with aggressive measures if he does have advanced MDS. We can certainly consider growth factor support if he has recurrent sepsis. I see little value to repeating a bone marrow biopsy at this time. We have elected to continue with observation and surveillance and I gave him clear instructions to report to the emergency department if he develops any fevers, chills or sepsis signs. 2. Rheumatoid arthritis, seems to be stable. 3. Followup will be in 5 months.    [facility], MD 5/26/20168:39 AM

## 2015-01-15 NOTE — Telephone Encounter (Signed)
Pt confirmed labs/ov per 05/26 POF, gave pt AVS and Calendar..... KJ °

## 2015-02-03 DIAGNOSIS — Z7901 Long term (current) use of anticoagulants: Secondary | ICD-10-CM | POA: Diagnosis not present

## 2015-02-06 ENCOUNTER — Ambulatory Visit (INDEPENDENT_AMBULATORY_CARE_PROVIDER_SITE_OTHER): Payer: Medicare Other | Admitting: *Deleted

## 2015-02-06 DIAGNOSIS — E538 Deficiency of other specified B group vitamins: Secondary | ICD-10-CM | POA: Diagnosis not present

## 2015-02-06 MED ORDER — CYANOCOBALAMIN 1000 MCG/ML IJ SOLN
1000.0000 ug | Freq: Once | INTRAMUSCULAR | Status: AC
  Administered 2015-02-06: 1000 ug via INTRAMUSCULAR

## 2015-03-03 DIAGNOSIS — Z7901 Long term (current) use of anticoagulants: Secondary | ICD-10-CM | POA: Diagnosis not present

## 2015-03-09 ENCOUNTER — Ambulatory Visit (INDEPENDENT_AMBULATORY_CARE_PROVIDER_SITE_OTHER): Payer: Medicare Other | Admitting: *Deleted

## 2015-03-09 DIAGNOSIS — E538 Deficiency of other specified B group vitamins: Secondary | ICD-10-CM

## 2015-03-09 MED ORDER — CYANOCOBALAMIN 1000 MCG/ML IJ SOLN
1000.0000 ug | Freq: Once | INTRAMUSCULAR | Status: AC
  Administered 2015-03-09: 1000 ug via INTRAMUSCULAR

## 2015-03-21 ENCOUNTER — Emergency Department (HOSPITAL_COMMUNITY): Payer: Medicare Other

## 2015-03-21 ENCOUNTER — Inpatient Hospital Stay (HOSPITAL_COMMUNITY)
Admission: EM | Admit: 2015-03-21 | Discharge: 2015-03-26 | DRG: 871 | Disposition: A | Discharge status: Home Health | Payer: Medicare Other | Attending: Internal Medicine | Admitting: Internal Medicine

## 2015-03-21 ENCOUNTER — Encounter (HOSPITAL_COMMUNITY): Payer: Self-pay | Admitting: General Practice

## 2015-03-21 DIAGNOSIS — B9561 Methicillin susceptible Staphylococcus aureus infection as the cause of diseases classified elsewhere: Secondary | ICD-10-CM | POA: Diagnosis present

## 2015-03-21 DIAGNOSIS — N452 Orchitis: Secondary | ICD-10-CM | POA: Diagnosis present

## 2015-03-21 DIAGNOSIS — E861 Hypovolemia: Secondary | ICD-10-CM | POA: Diagnosis not present

## 2015-03-21 DIAGNOSIS — I4891 Unspecified atrial fibrillation: Secondary | ICD-10-CM | POA: Diagnosis not present

## 2015-03-21 DIAGNOSIS — R6521 Severe sepsis with septic shock: Secondary | ICD-10-CM | POA: Diagnosis not present

## 2015-03-21 DIAGNOSIS — E538 Deficiency of other specified B group vitamins: Secondary | ICD-10-CM | POA: Diagnosis present

## 2015-03-21 DIAGNOSIS — Z79899 Other long term (current) drug therapy: Secondary | ICD-10-CM | POA: Diagnosis not present

## 2015-03-21 DIAGNOSIS — D696 Thrombocytopenia, unspecified: Secondary | ICD-10-CM | POA: Diagnosis not present

## 2015-03-21 DIAGNOSIS — N179 Acute kidney failure, unspecified: Secondary | ICD-10-CM | POA: Diagnosis present

## 2015-03-21 DIAGNOSIS — M069 Rheumatoid arthritis, unspecified: Secondary | ICD-10-CM | POA: Diagnosis not present

## 2015-03-21 DIAGNOSIS — D649 Anemia, unspecified: Secondary | ICD-10-CM | POA: Diagnosis not present

## 2015-03-21 DIAGNOSIS — R791 Abnormal coagulation profile: Secondary | ICD-10-CM | POA: Diagnosis present

## 2015-03-21 DIAGNOSIS — R6520 Severe sepsis without septic shock: Secondary | ICD-10-CM | POA: Diagnosis not present

## 2015-03-21 DIAGNOSIS — I10 Essential (primary) hypertension: Secondary | ICD-10-CM | POA: Diagnosis not present

## 2015-03-21 DIAGNOSIS — A419 Sepsis, unspecified organism: Secondary | ICD-10-CM | POA: Diagnosis not present

## 2015-03-21 DIAGNOSIS — D709 Neutropenia, unspecified: Secondary | ICD-10-CM | POA: Diagnosis present

## 2015-03-21 DIAGNOSIS — I499 Cardiac arrhythmia, unspecified: Secondary | ICD-10-CM | POA: Diagnosis not present

## 2015-03-21 DIAGNOSIS — D510 Vitamin B12 deficiency anemia due to intrinsic factor deficiency: Secondary | ICD-10-CM | POA: Diagnosis present

## 2015-03-21 DIAGNOSIS — N433 Hydrocele, unspecified: Secondary | ICD-10-CM | POA: Diagnosis not present

## 2015-03-21 DIAGNOSIS — N5089 Other specified disorders of the male genital organs: Secondary | ICD-10-CM

## 2015-03-21 DIAGNOSIS — I2510 Atherosclerotic heart disease of native coronary artery without angina pectoris: Secondary | ICD-10-CM | POA: Diagnosis present

## 2015-03-21 DIAGNOSIS — A412 Sepsis due to unspecified staphylococcus: Principal | ICD-10-CM | POA: Diagnosis present

## 2015-03-21 DIAGNOSIS — K4090 Unilateral inguinal hernia, without obstruction or gangrene, not specified as recurrent: Secondary | ICD-10-CM | POA: Diagnosis not present

## 2015-03-21 DIAGNOSIS — I861 Scrotal varices: Secondary | ICD-10-CM | POA: Diagnosis not present

## 2015-03-21 DIAGNOSIS — I252 Old myocardial infarction: Secondary | ICD-10-CM | POA: Diagnosis not present

## 2015-03-21 DIAGNOSIS — K5730 Diverticulosis of large intestine without perforation or abscess without bleeding: Secondary | ICD-10-CM | POA: Diagnosis not present

## 2015-03-21 DIAGNOSIS — R0602 Shortness of breath: Secondary | ICD-10-CM | POA: Diagnosis not present

## 2015-03-21 DIAGNOSIS — I482 Chronic atrial fibrillation, unspecified: Secondary | ICD-10-CM | POA: Diagnosis present

## 2015-03-21 DIAGNOSIS — E860 Dehydration: Secondary | ICD-10-CM | POA: Diagnosis present

## 2015-03-21 DIAGNOSIS — R531 Weakness: Secondary | ICD-10-CM | POA: Diagnosis not present

## 2015-03-21 DIAGNOSIS — E785 Hyperlipidemia, unspecified: Secondary | ICD-10-CM | POA: Diagnosis not present

## 2015-03-21 DIAGNOSIS — N492 Inflammatory disorders of scrotum: Secondary | ICD-10-CM

## 2015-03-21 DIAGNOSIS — N451 Epididymitis: Secondary | ICD-10-CM | POA: Diagnosis present

## 2015-03-21 DIAGNOSIS — Z87891 Personal history of nicotine dependence: Secondary | ICD-10-CM | POA: Diagnosis not present

## 2015-03-21 DIAGNOSIS — Z7901 Long term (current) use of anticoagulants: Secondary | ICD-10-CM

## 2015-03-21 DIAGNOSIS — N453 Epididymo-orchitis: Secondary | ICD-10-CM | POA: Diagnosis not present

## 2015-03-21 DIAGNOSIS — I959 Hypotension, unspecified: Secondary | ICD-10-CM | POA: Diagnosis not present

## 2015-03-21 DIAGNOSIS — N390 Urinary tract infection, site not specified: Secondary | ICD-10-CM | POA: Diagnosis not present

## 2015-03-21 DIAGNOSIS — D72819 Decreased white blood cell count, unspecified: Secondary | ICD-10-CM | POA: Diagnosis present

## 2015-03-21 DIAGNOSIS — K4041 Unilateral inguinal hernia, with gangrene, recurrent: Secondary | ICD-10-CM | POA: Diagnosis not present

## 2015-03-21 DIAGNOSIS — R5081 Fever presenting with conditions classified elsewhere: Secondary | ICD-10-CM | POA: Diagnosis not present

## 2015-03-21 LAB — COMPREHENSIVE METABOLIC PANEL
ALK PHOS: 91 U/L (ref 38–126)
ALT: 19 U/L (ref 17–63)
ANION GAP: 3 — AB (ref 5–15)
AST: 45 U/L — ABNORMAL HIGH (ref 15–41)
Albumin: 1.8 g/dL — ABNORMAL LOW (ref 3.5–5.0)
BUN: 12 mg/dL (ref 6–20)
CALCIUM: 7.1 mg/dL — AB (ref 8.9–10.3)
CO2: 22 mmol/L (ref 22–32)
CREATININE: 1.06 mg/dL (ref 0.61–1.24)
Chloride: 109 mmol/L (ref 101–111)
GFR calc Af Amer: >60 mL/min (ref >60)
GFR calc non Af Amer: 59 mL/min — ABNORMAL LOW (ref >60)
GLUCOSE: 106 mg/dL — AB (ref 65–99)
Potassium: 3.7 mmol/L (ref 3.5–5.1)
Sodium: 134 mmol/L — ABNORMAL LOW (ref 135–145)
Total Bilirubin: 0.9 mg/dL (ref 0.3–1.2)
Total Protein: 6.4 g/dL — ABNORMAL LOW (ref 6.5–8.1)

## 2015-03-21 LAB — URINALYSIS, ROUTINE W REFLEX MICROSCOPIC
Bilirubin Urine: NEGATIVE
GLUCOSE, UA: NEGATIVE mg/dL
Ketones, ur: 15 mg/dL — AB
Nitrite: POSITIVE — AB
Protein, ur: 100 mg/dL — AB
SPECIFIC GRAVITY, URINE: 1.022 (ref 1.005–1.030)
UROBILINOGEN UA: 1 mg/dL (ref 0.0–1.0)
pH: 7.5 (ref 5.0–8.0)

## 2015-03-21 LAB — CBC WITH DIFFERENTIAL/PLATELET
Basophils Absolute: 0 10*3/uL (ref 0.0–0.1)
Basophils Relative: 0 % (ref 0–1)
Eosinophils Absolute: 0 10*3/uL (ref 0.0–0.7)
Eosinophils Relative: 0 % (ref 0–5)
HCT: 37.2 % — ABNORMAL LOW (ref 39.0–52.0)
Hemoglobin: 12.8 g/dL — ABNORMAL LOW (ref 13.0–17.0)
LYMPHS ABS: 0.3 10*3/uL — AB (ref 0.7–4.0)
Lymphocytes Relative: 17 % (ref 12–46)
MCH: 30.7 pg (ref 26.0–34.0)
MCHC: 34.4 g/dL (ref 30.0–36.0)
MCV: 89.2 fL (ref 78.0–100.0)
MONO ABS: 0.3 10*3/uL (ref 0.1–1.0)
MONOS PCT: 14 % — AB (ref 3–12)
NEUTROS PCT: 69 % (ref 43–77)
Neutro Abs: 1.4 10*3/uL — ABNORMAL LOW (ref 1.7–7.7)
PLATELETS: 51 10*3/uL — AB (ref 150–400)
RBC: 4.17 MIL/uL — AB (ref 4.22–5.81)
RDW: 14.8 % (ref 11.5–15.5)
WBC: 2 10*3/uL — AB (ref 4.0–10.5)

## 2015-03-21 LAB — TYPE AND SCREEN
ABO/RH(D): O POS
ANTIBODY SCREEN: NEGATIVE

## 2015-03-21 LAB — URINE MICROSCOPIC-ADD ON

## 2015-03-21 LAB — I-STAT CHEM 8, ED
BUN: 17 mg/dL (ref 6–20)
CHLORIDE: 104 mmol/L (ref 101–111)
Calcium, Ion: 1.08 mmol/L — ABNORMAL LOW (ref 1.13–1.30)
Creatinine, Ser: 1.2 mg/dL (ref 0.61–1.24)
Glucose, Bld: 102 mg/dL — ABNORMAL HIGH (ref 65–99)
HCT: 38 % — ABNORMAL LOW (ref 39.0–52.0)
Hemoglobin: 12.9 g/dL — ABNORMAL LOW (ref 13.0–17.0)
Potassium: 4.4 mmol/L (ref 3.5–5.1)
Sodium: 135 mmol/L (ref 135–145)
TCO2: 17 mmol/L (ref 0–100)

## 2015-03-21 LAB — MAGNESIUM: MAGNESIUM: 1.8 mg/dL (ref 1.7–2.4)

## 2015-03-21 LAB — PHOSPHORUS: Phosphorus: 2.8 mg/dL (ref 2.5–4.6)

## 2015-03-21 LAB — PROTIME-INR
INR: 3.57 — ABNORMAL HIGH (ref 0.00–1.49)
Prothrombin Time: 34.9 seconds — ABNORMAL HIGH (ref 11.6–15.2)

## 2015-03-21 LAB — I-STAT CG4 LACTIC ACID, ED
LACTIC ACID, VENOUS: 1.09 mmol/L (ref 0.5–2.0)
Lactic Acid, Venous: 0.9 mmol/L (ref 0.5–2.0)

## 2015-03-21 LAB — GLUCOSE, CAPILLARY: GLUCOSE-CAPILLARY: 90 mg/dL (ref 65–99)

## 2015-03-21 LAB — I-STAT TROPONIN, ED: Troponin i, poc: 0 ng/mL (ref 0.00–0.08)

## 2015-03-21 LAB — LIPASE, BLOOD: Lipase: 33 U/L (ref 22–51)

## 2015-03-21 LAB — BRAIN NATRIURETIC PEPTIDE: B Natriuretic Peptide: 272.4 pg/mL — ABNORMAL HIGH (ref 0.0–100.0)

## 2015-03-21 LAB — ABO/RH: ABO/RH(D): O POS

## 2015-03-21 MED ORDER — PIPERACILLIN-TAZOBACTAM 3.375 G IVPB 30 MIN
3.3750 g | Freq: Once | INTRAVENOUS | Status: AC
  Administered 2015-03-21: 3.375 g via INTRAVENOUS
  Filled 2015-03-21: qty 50

## 2015-03-21 MED ORDER — ACETAMINOPHEN 325 MG PO TABS
650.0000 mg | ORAL_TABLET | Freq: Once | ORAL | Status: AC
  Administered 2015-03-21: 650 mg via ORAL
  Filled 2015-03-21: qty 2

## 2015-03-21 MED ORDER — INSULIN ASPART 100 UNIT/ML ~~LOC~~ SOLN: 0.0000 [IU] | Freq: Every day | SUBCUTANEOUS | Status: DC

## 2015-03-21 MED ORDER — PRAVASTATIN SODIUM 10 MG PO TABS
40.0000 mg | ORAL_TABLET | Freq: Every day | ORAL | Status: DC
  Administered 2015-03-22 – 2015-03-26 (×5): 40 mg via ORAL
  Filled 2015-03-21 (×2): qty 4
  Filled 2015-03-21 (×3): qty 1

## 2015-03-21 MED ORDER — SODIUM CHLORIDE 0.9 % IV BOLUS (SEPSIS)
1000.0000 mL | Freq: Once | INTRAVENOUS | Status: AC
  Administered 2015-03-21: 1000 mL via INTRAVENOUS

## 2015-03-21 MED ORDER — METOPROLOL TARTRATE 50 MG PO TABS
50.0000 mg | ORAL_TABLET | Freq: Every day | ORAL | Status: DC
  Administered 2015-03-22 – 2015-03-24 (×3): 50 mg via ORAL
  Filled 2015-03-21 (×3): qty 1

## 2015-03-21 MED ORDER — IOHEXOL 300 MG/ML  SOLN
100.0000 mL | Freq: Once | INTRAMUSCULAR | Status: AC | PRN
  Administered 2015-03-21: 100 mL via INTRAVENOUS

## 2015-03-21 MED ORDER — VANCOMYCIN HCL 10 G IV SOLR: 20.0000 mg/kg | Freq: Once | INTRAVENOUS | Status: DC

## 2015-03-21 MED ORDER — SODIUM CHLORIDE 0.9 % IV SOLN: 250.0000 mL | INTRAVENOUS | Status: DC | PRN

## 2015-03-21 MED ORDER — PIPERACILLIN-TAZOBACTAM 3.375 G IVPB
3.3750 g | Freq: Three times a day (TID) | INTRAVENOUS | Status: DC
  Administered 2015-03-22 – 2015-03-23 (×6): 3.375 g via INTRAVENOUS
  Filled 2015-03-21 (×7): qty 50

## 2015-03-21 MED ORDER — PIPERACILLIN-TAZOBACTAM 3.375 G IVPB 30 MIN
3.3750 g | Freq: Three times a day (TID) | INTRAVENOUS | Status: DC
  Filled 2015-03-21: qty 50

## 2015-03-21 MED ORDER — LEVOFLOXACIN IN D5W 750 MG/150ML IV SOLN: 750.0000 mg | INTRAVENOUS | Status: DC

## 2015-03-21 MED ORDER — DILTIAZEM HCL 100 MG IV SOLR
5.0000 mg/h | Freq: Once | INTRAVENOUS | Status: AC
  Administered 2015-03-21: 5 mg/h via INTRAVENOUS

## 2015-03-21 MED ORDER — LEVOFLOXACIN IN D5W 750 MG/150ML IV SOLN
750.0000 mg | Freq: Once | INTRAVENOUS | Status: DC
  Filled 2015-03-21: qty 150

## 2015-03-21 MED ORDER — INSULIN ASPART 100 UNIT/ML ~~LOC~~ SOLN: 0.0000 [IU] | Freq: Three times a day (TID) | SUBCUTANEOUS | Status: DC

## 2015-03-21 MED ORDER — VANCOMYCIN HCL IN DEXTROSE 1-5 GM/200ML-% IV SOLN
1000.0000 mg | Freq: Once | INTRAVENOUS | Status: AC
  Administered 2015-03-21: 1000 mg via INTRAVENOUS
  Filled 2015-03-21: qty 200

## 2015-03-21 MED ORDER — METOPROLOL TARTRATE 25 MG PO TABS: 25.0000 mg | ORAL_TABLET | Freq: Two times a day (BID) | ORAL | Status: DC

## 2015-03-21 MED ORDER — METOPROLOL TARTRATE 25 MG PO TABS
25.0000 mg | ORAL_TABLET | Freq: Every day | ORAL | Status: DC
  Administered 2015-03-23 – 2015-03-24 (×2): 25 mg via ORAL
  Filled 2015-03-21 (×4): qty 1

## 2015-03-21 MED ORDER — LEVOTHYROXINE SODIUM 25 MCG PO TABS
25.0000 ug | ORAL_TABLET | Freq: Every day | ORAL | Status: DC
  Administered 2015-03-22 – 2015-03-26 (×5): 25 ug via ORAL
  Filled 2015-03-21 (×6): qty 1

## 2015-03-21 MED ORDER — ACETAMINOPHEN 325 MG PO TABS: 650.0000 mg | ORAL_TABLET | ORAL | Status: DC | PRN

## 2015-03-21 NOTE — Progress Notes (Signed)
Attempted to collect sputum sample. Patient states that he is not currently coughing anything up. Sample cup and biohazard bag left at bedside and RN made aware. RT will continue to monitor.

## 2015-03-21 NOTE — ED Provider Notes (Signed)
History   Chief Complaint  Patient presents with  . Shortness of Breath    HPI 81 year old male past history is below notable for CAD, A. fib on warfarin, hypertension, prior right inguinal hernia status post repair who presents to ED via EMS accompanied by his wife is also providing history today for complaint of 3 days fatigue and cough. There is sign patient has had shortness of breath. He denies any chest pain. He's been having subjective fevers and chills. Patient denies any diaphoresis, nausea, vomiting, diarrhea, leg pain, leg swelling. Patient does report having a swollen tender right testicle which she has never had before. He does have history of right inguinal hernia. Which was repaired back in 2012. He says this feels somewhat similar to that, although he notes he has never had redness and swelling of his scrotum.   Tachycardic to the 160s. They gave patient 20 mg Cardizem and his heart rate improved to 100. His blood pressure was 621 systolic. Patient remained stable during transport.   Onset of symptoms: Gradual. Duration 3 days .  Modifying factors none.  Severity: moderate.  Associated symptoms: as above.  Hx of similar symptoms: yes, with afib.    No sick contacts.   Past medical/surgical history, social history, medications, allergies and FH have been reviewed with patient and/or in documentation. Furthermore, if pt family or friend(s) present, additional historical information was obtained from them.  Past Medical History  Diagnosis Date  . ANEMIA, PERNICIOUS 05/14/2007  . Atrial fibrillation 08/08/2007  . BENIGN PROSTATIC HYPERTROPHY 03/05/2007  . CORONARY ARTERY DISEASE 03/05/2007  . DYSPLASTIC NEVUS 01/24/2008  . HYPERLIPIDEMIA 03/05/2007  . HYPERTENSION 03/05/2007  . MYOCARDIAL INFARCTION, HX OF 03/05/2007    1982  . NEUTROPENIA NOS 05/11/2007  . Rheumatoid arthritis(714.0) 03/05/2007  . VITAMIN B12 DEFICIENCY 08/08/2007  . Current use of long term anticoagulation   .  Rheumatoid arthritis(714.0)   . Hernia     umbilical   Past Surgical History  Procedure Laterality Date  . Laparoscopic cholecystectomy  11/09/2001    Dr Lennie Hummer  . Nasal polyp surgery    . Ptca  15 yrs ago  . External ear surgery  2 years ago  . Laparoscopic inguinal hernia repair  01/27/11    right direct, Dr Greer Pickerel  . Hernia repair  2012   No family history on file. History  Substance Use Topics  . Smoking status: Former Smoker    Types: Cigarettes    Quit date: 08/22/1978  . Smokeless tobacco: Never Used  . Alcohol Use: No     Review of Systems Constitutional: - F/C, -fatigue.  HENT: - congestion, -rhinorrhea, -sore throat.   Eyes: - eye pain, -visual disturbance.  Respiratory: - cough, -SOB, -hemoptysis.   Cardiovascular: - CP, -palps.  Gastrointestinal: - N/V/D, -abd pain  Genitourinary: - flank pain, -dysuria, -frequency.  Musculoskeletal: - myalgia/arthritis, -joint swelling, -gait abnormality, -back pain, -neck pain/stiffness, -leg pain/swelling.  Skin: - rash/lesion.  Neurological: - focal weakness, -lightheadedness, -dizziness, -numbness, -HA.  All other systems reviewed and are negative.   Physical Exam  Physical Exam ED Triage Vitals  Enc Vitals Group     BP 03/21/15 1615 99/64 mmHg     Pulse Rate 03/21/15 1615 115     Resp 03/21/15 1615 29     Temp 03/21/15 1721 101.2 F (38.4 C)     Temp Source 03/21/15 1721 Rectal     SpO2 03/21/15 1605 93 %  Weight 03/21/15 2130 149 lb 0.5 oz (67.6 kg)     Height 03/21/15 2130 5\' 7"  (1.702 m)     Head Cir --      Peak Flow --      Pain Score --      Pain Loc --      Pain Edu? --      Excl. in [place]? --     Constitutional: Patient is chronically ill appearing and in no acute distress Head: Normocephalic and atraumatic.  Eyes: Extraocular motion intact, no scleral icterus Mouth: MMM, OP clear Neck: Supple without meningismus, mass, or overt JVD Respiratory: No respiratory distress. Normal WOB. No  w/r/g. KD:[id number] irregular rhythm, heart rate 100 no obvious murmurs.  Pulses +2 and symmetric. Euvolemic Abdomen: Soft, NT, ND, no r/g. No mass.   GU: Patient's scrotum is swollen, erythematous on the right. This does not involve the penis or left hemiscrotum. R hemiscrotum is mildly TTP (no marked pain, no creptius/ecchymosis). No obvious bowel loop appreciated. No malodorous purulent drainage to scrotum. No bulge in inguinal canal. Penis is nontender. No phimosis or paraphimosis.  MSK: Extremities are atraumatic without deformity, ROM intact Skin: Warm, dry, intact without rash Neuro: AAOx4, MAE 5/5 sym, no focal deficit noted   ED Course  Procedures   Labs Reviewed  CBC WITH DIFFERENTIAL/PLATELET  BRAIN NATRIURETIC PEPTIDE  PROTIME-INR  I-STAT TROPOININ, ED  I-STAT CHEM 8, ED  I-STAT CG4 LACTIC ACID, ED   I personally reviewed and interpreted all labs.  DG Chest 2 View    (Results Pending)   I personally viewed above image(s) which were used in my medical decision making. Formal interpretations by Radiology.  MDM: Kevin Rubio is a 81 y.o. male with H&P as above who p/w CC: Shortness of breath, fatigue 3 days. Additionally patient complaining of right scrotal pain and swelling.  On arrival, patient is hemodynamic stable and in no apparent distress. Heart rate initially 100s but initial EKG is A. fib RVR. Patient will get diltiazem infusion. Screening labs and chest x-ray are sent for evaluation of possible infectious source.  GU exam suggestive of possible hydrocele versus hernia. Will get a bedside ultrasound and perform illumination for further evaluation. This is not concerning for Fournier's gangrene.  Workup notable for UTI, normal chest x-ray, CT a/p showing small R ing hernia that does not go into scrotum. Pt received Vanc and Zosyn to cover urine and cellulitis. Dilt gtt d/c'd and pt remained rate controlled.  Pt continues to be hypotensive despite rate control.  Additional IVF given (pt received total 3 L in ED).   Pt will be admitted to ICU for remainder of care.   Clinical Impression: 1. Septic shock   2. Scrotal swelling   3. Cellulitis, scrotum   4. Atrial fibrillation with rapid ventricular response     Disposition: admit  Condition: Good  I have discussed the results, Dx and Tx plan with the pt(& family if present). He/she/they expressed understanding and agree(s) with the plan. Pt seen in conjunction with Dr. Thresa Ross, [facility] Resident - PGY-3    Kirstie Peri, MD 03/22/15 [id number]  Elnora Morrison, MD 03/24/15 [phone]

## 2015-03-21 NOTE — H&P (Signed)
PULMONARY / CRITICAL CARE MEDICINE HISTORY AND PHYSICAL EXAMINATION   Name: Kevin Rubio MRN: [id number] DOB: Feb 29, 1922    ADMISSION DATE:  03/21/2015  PRIMARY SERVICE: PCCM  CHIEF COMPLAINT:  Urinary Frequency, weakness  BRIEF PATIENT DESCRIPTION: 81 y/o man with leukopenia and thrombocytopenia who presents to the ED with severe sepsis of urinary origin and likely hernia.  SIGNIFICANT EVENTS / STUDIES:  CT Abd: Small right inguinal hernia containing fat and fluid. Small amount of free fluid also in the cul-de-sac of the pelvis. U/A: Suggestive of UTI  LINES / TUBES: PIV  CULTURES: Urine: 7/30 pending Blood: 7/30 x2 pending  ANTIBIOTICS: Vanc 7/30 --> Zosyn 7/30 -->  HISTORY OF PRESENT ILLNESS:   Kevin Rubio is a pleasant 81 y/o man with high functional status but history of longstanding leukopenia, thrombocytopenia (marrow bx in 2010 unrevealing), atrial fibrillation, CAD with remote hx of MI, as well RA who presents to the ED with a 1-2 week history of progressive weakness, chills, and urinary frequncey. He denied fevers, but endorsed chills. However, on presentation, he was found to be frebrile. He has had poor PO intake, but has been complaint with his medication regimen (including warfarin). He eventually presented to the ED as his weakness progressed to the point when he was unable to get out of bed. At baseline, he is fully independent for ADLs, and still drives short distances per his PCP note. He also reports some scrotal swelling and pain.  PAST MEDICAL HISTORY :  Past Medical History  Diagnosis Date  . ANEMIA, PERNICIOUS 05/14/2007  . Atrial fibrillation 08/08/2007  . BENIGN PROSTATIC HYPERTROPHY 03/05/2007  . CORONARY ARTERY DISEASE 03/05/2007  . DYSPLASTIC NEVUS 01/24/2008  . HYPERLIPIDEMIA 03/05/2007  . HYPERTENSION 03/05/2007  . MYOCARDIAL INFARCTION, HX OF 03/05/2007    1982  . NEUTROPENIA NOS 05/11/2007  . Rheumatoid arthritis(714.0) 03/05/2007  . VITAMIN B12  DEFICIENCY 08/08/2007  . Current use of long term anticoagulation   . Rheumatoid arthritis(714.0)   . Hernia     umbilical   Past Surgical History  Procedure Laterality Date  . Laparoscopic cholecystectomy  11/09/2001    Dr Lennie Hummer  . Nasal polyp surgery    . Ptca  15 yrs ago  . External ear surgery  2 years ago  . Laparoscopic inguinal hernia repair  01/27/11    right direct, Dr Greer Pickerel  . Hernia repair  2012   Prior to Admission medications   Medication Sig Start Date End Date Taking? Authorizing Provider  acetaminophen (TYLENOL) 500 MG tablet Take 1,000 mg by mouth daily as needed for mild pain.   Yes Historical Provider, MD  ascorbic Acid (VITAMIN C) 500 MG CPCR Take 500 mg by mouth daily.     Yes Historical Provider, MD  cyanocobalamin (,VITAMIN B-12,) 1000 MCG/ML injection Inject 1,000 mcg into the muscle every 30 (thirty) days.   Yes Historical Provider, MD  Ferrous Sulfate (IRON CR PO) Take 65 mg by mouth daily.     Yes Historical Provider, MD  guaiFENesin (MUCINEX) 600 MG 12 hr tablet Take 600 mg by mouth daily.    Yes Historical Provider, MD  levothyroxine (SYNTHROID, LEVOTHROID) 25 MCG tablet TAKE ONE TABLET BY MOUTH ONCE DAILY BEFORE  BREAKFAST 11/28/14  Yes Marletta Lor, MD  metoprolol tartrate (LOPRESSOR) 25 MG tablet Take 25-50 mg by mouth 2 (two) times daily. Two tablets in the morning, and one tablet in the afternoon 10/12/10  Yes Marletta Lor, MD  Multiple Vitamins-Minerals (CENTRUM SILVER PO) Take 1 tablet by mouth daily.    Yes Historical Provider, MD  Omega-3 Fatty Acids (FISH OIL) 1000 MG CAPS Take 2 capsules by mouth daily.     Yes Historical Provider, MD  pravastatin (PRAVACHOL) 40 MG tablet TAKE ONE TABLET BY MOUTH ONCE DAILY 10/13/14  Yes Marletta Lor, MD  warfarin (COUMADIN) 4 MG tablet Take 4-6 mg by mouth daily. 6 mg on Thurs and Sunday.  4 mg all other days.   Yes Historical Provider, MD  ofloxacin (FLOXIN) 0.3 % otic solution Place 5  drops into the left ear 2 (two) times daily. Patient not taking: Reported on 03/21/2015 08/19/14   Bruce W Burchette, MD   Allergies  Allergen Reactions  . Cefpodoxime Proxetil Other (See Comments)    unknown    FAMILY HISTORY:  No family history on file. SOCIAL HISTORY:  reports that he quit smoking about 36 years ago. His smoking use included Cigarettes. He has never used smokeless tobacco. He reports that he does not drink alcohol or use illicit drugs.  REVIEW OF SYSTEMS:  Urinary frequency, scrotal pain.  SUBJECTIVE:   VITAL SIGNS: Temp:  [99 F (37.2 C)-101.2 F (38.4 C)] 99 F (37.2 C) (07/30 2113) Pulse Rate:  [62-157] 149 (07/30 2200) Resp:  [20-40] 22 (07/30 2200) BP: (75-116)/(45-73) 84/48 mmHg (07/30 2200) SpO2:  [92 %-99 %] 96 % (07/30 2200) Weight:  [149 lb 0.5 oz (67.6 kg)] 149 lb 0.5 oz (67.6 kg) (07/30 2130) HEMODYNAMICS:   VENTILATOR SETTINGS:   INTAKE / OUTPUT: Intake/Output      07 /30 0701 - 07/31 0700   I.V. (mL/kg) 3000 (44.4)   Total Intake(mL/kg) 3000 (44.4)   Urine (mL/kg/hr) 250   Total Output 250   Net +2750         PHYSICAL EXAMINATION: General:  Elderly man awake, pleasant and cooperative. Neuro:  Decreased hearing acuity. HEENT:  Dry MM. Neck: No masses Cardiovascular:  Irregular rate Lungs:  CTAB. Abdomen:  Soft, palpable mass within scrotom extending down from inguinal canal. Musculoskeletal:  Intact. Skin:  No rashes.  LABS:  CBC  Recent Labs Lab 03/21/15 1635 03/21/15 1642  WBC 2.0*  --   HGB 12.8* 12.9*  HCT 37.2* 38.0*  PLT 51*  --    Coag's  Recent Labs Lab 03/21/15 1635  INR 3.57*   BMET  Recent Labs Lab 03/21/15 1642  NA 135  K 4.4  CL 104  BUN 17  CREATININE 1.20  GLUCOSE 102*   Electrolytes No results for input(s): CALCIUM, MG, PHOS in the last 168 hours. Sepsis Markers  Recent Labs Lab 03/21/15 1643 03/21/15 1922  LATICACIDVEN 0.90 1.09   ABG No results for input(s): PHART,  PCO2ART, PO2ART in the last 168 hours. Liver Enzymes No results for input(s): AST, ALT, ALKPHOS, BILITOT, ALBUMIN in the last 168 hours. Cardiac Enzymes No results for input(s): TROPONINI, PROBNP in the last 168 hours. Glucose No results for input(s): GLUCAP in the last 168 hours.  Imaging Ct Abdomen Pelvis W Contrast  03/21/2015   CLINICAL DATA:  Evaluate right hernia.  Weakness for 3 days.  EXAM: CT ABDOMEN AND PELVIS WITH CONTRAST  TECHNIQUE: Multidetector CT imaging of the abdomen and pelvis was performed using the standard protocol following bolus administration of intravenous contrast.  CONTRAST:  138mL OMNIPAQUE IOHEXOL 300 MG/ML  SOLN  COMPARISON:  None.  FINDINGS: Right basilar dependent opacity could reflect atelectasis or infiltrate. Suspect underlying COPD. No  effusions. Heart is normal size.  Prior cholecystectomy. No focal hepatic abnormality. The spleen is markedly enlarged, measuring 20 cm in craniocaudal length. Pancreas, adrenals and kidneys are unremarkable. Small cysts in the lower pole of the right kidney. No hydronephrosis.  Stomach, aorta and iliac vessels are normal caliber.  No adenopathy.  No acute bony abnormality or focal bone lesion. Small bowel are unremarkable. There is a small right inguinal hernia containing fat. Fluid within the right inguinal canal as well. Small amount of free fluid in the cul-de-sac of the pelvis. There sigmoid diverticulosis. No active diverticulitis.  IMPRESSION: Marked splenomegaly with a craniocaudal length of 20 cm.  Small right inguinal hernia containing fat and fluid. Small amount of free fluid also in the cul-de-sac of the pelvis.  Right dependent basilar atelectasis or infiltrate.  Sigmoid diverticulosis.   Electronically Signed   By: Rolm Baptise M.D.   On: 03/21/2015 18:08   Dg Chest Portable 1 View  03/21/2015   CLINICAL DATA:  Shortness of breath and weakness for 3 days. Anticoagulation for atrial fibrillation.  EXAM: PORTABLE CHEST - 1  VIEW  COMPARISON:  09/17/2014  FINDINGS: A single AP portable view of the chest demonstrates no focal airspace consolidation or alveolar edema. The lungs are grossly clear. There is no large effusion or pneumothorax. Cardiac and mediastinal contours appear unremarkable.  IMPRESSION: No active disease.   Electronically Signed   By: Andreas Newport M.D.   On: 03/21/2015 17:22    EKG: Afib CXR: Unremarkable   ASSESSMENT / PLAN:  Active Problems:   Severe sepsis   PULMONARY A: No active issues P:    CARDIOVASCULAR A: Atrial Fibrillation Hypotension due to hypovolemia P:   - rate improved with hydration. Suspect severe sepsis and decreased PO intake as cause for low circulating volume and hypotension.  RENAL A: AKI P:   In geriatric population, BUN and Cr underestimate degree of renal impairment. Both are slightly elevated from baseline, and clinically, he is dehydrated. Will provide gentle hydration and carefully monitor labs.  GASTROINTESTINAL A: Possible hernia P:   CT did not show bowel in inguinal canal, but will obtain U/S to better evaluate. Recommend surgical consultation in the morning.  HEMATOLOGIC A: Leukopenia (chronic) Thrombocytopenia (chronic) Anemia (chronic) Supratheraputic INR P:   Appears unchanged from baseline. Is s/p BM bx in 2010 which was unremarkable. Could consider repeat, but unclear benefit since is unchanged. Holding coumadin for now.  INFECTIOUS A: Severe Sepsis Neutropenic fever due to UTI P:   Improved with fluids Vanc/zosyn Need to evaluate scrotal process as may be related, although clinically is most consistent with sepsis of urinary origin.  ENDOCRINE A: CBGs with meals and QHS P:   Insulin per sensitive protocol.  NEUROLOGIC A: No deficits. P:   n/a  BEST PRACTICE / DISPOSITION Level of Care:  ICU Primary Service:  PCCM Consultants:  None Code Status:  Full Diet:  Heart healthy DVT Px:  Fully anticoagulated on  coumadin. Holding given INR GI Px:  None Skin Integrity:  Intact Social / Family:  Updated.  TODAY'S SUMMARY: 81 y/o man with neutropenic fever due to UTI.  I have personally obtained a history, examined the patient, evaluated laboratory and imaging results, formulated the assessment and plan and placed orders.  CRITICAL CARE: The patient is critically ill with multiple organ systems failure and requires high complexity decision making for assessment and support, frequent evaluation and titration of therapies, application of advanced monitoring technologies and extensive  interpretation of multiple databases. Critical Care Time devoted to patient care services described in this note is 60 minutes.   Luz Brazen, MD Pulmonary and [place] Pager: [phone]   03/21/2015, 10:06 PM

## 2015-03-21 NOTE — ED Notes (Signed)
Pt brought in via [facility] with complaints of shortness of breath and weakness for three days. Pt has a history of A-fib and takes coumadin. Pt is A/O, and neurologically intact. When EMS arrived pt was in a-fib with heart rates ranging from 130-120. Pt was given 20 mg of Cardizem via EMS. EMS also gave pt 200 cc of N/S.

## 2015-03-21 NOTE — Progress Notes (Signed)
ANTIBIOTIC CONSULT NOTE - INITIAL  Pharmacy Consult for Levaquin Indication: rule out pneumonia  Allergies  Allergen Reactions  . Cefpodoxime Proxetil Other (See Comments)    unknown    Patient Measurements:   Adjusted Body Weight: n/a 67 kg in 12/2014  Vital Signs: Temp: 101.2 F (38.4 C) (07/30 1721) Temp Source: Rectal (07/30 1721) BP: 90/50 mmHg (07/30 1815) Pulse Rate: 119 (07/30 1815) Intake/Output from previous day:   Intake/Output from this shift:    Labs:  Recent Labs  03/21/15 1635 03/21/15 1642  WBC 2.0*  --   HGB 12.8* 12.9*  PLT 51*  --   CREATININE  --  1.20   CrCl cannot be calculated (Unknown ideal weight.). No results for input(s): VANCOTROUGH, VANCOPEAK, VANCORANDOM, GENTTROUGH, GENTPEAK, GENTRANDOM, TOBRATROUGH, TOBRAPEAK, TOBRARND, AMIKACINPEAK, AMIKACINTROU, AMIKACIN in the last 72 hours.   Microbiology: No results found for this or any previous visit (from the past 720 hour(s)).  Medical History: Past Medical History  Diagnosis Date  . ANEMIA, PERNICIOUS 05/14/2007  . Atrial fibrillation 08/08/2007  . BENIGN PROSTATIC HYPERTROPHY 03/05/2007  . CORONARY ARTERY DISEASE 03/05/2007  . DYSPLASTIC NEVUS 01/24/2008  . HYPERLIPIDEMIA 03/05/2007  . HYPERTENSION 03/05/2007  . MYOCARDIAL INFARCTION, HX OF 03/05/2007    1982  . NEUTROPENIA NOS 05/11/2007  . Rheumatoid arthritis(714.0) 03/05/2007  . VITAMIN B12 DEFICIENCY 08/08/2007  . Current use of long term anticoagulation   . Rheumatoid arthritis(714.0)   . Hernia     umbilical    Assessment: 81 yo male admitted with SOB, pharmacy asked to begin empiric coverage with Levaquin.  Also with orders for vancomycin and zosyn x 1 dose per ED MD.  Estimated CrCl ~ 40 ml/min.  Goal of Therapy:  resolution of infection  Plan:  1. Levaquin 750 mg IV q 48hrs. 2. Will change vancomycin dose to 1g x 1 (instead of 1342 mg) 3. F/u plans to continue vancomycin/zosyn.  Uvaldo Rising, BCPS   Clinical Pharmacist Pager [phone]  03/21/2015 6:41 PM

## 2015-03-22 ENCOUNTER — Inpatient Hospital Stay (HOSPITAL_COMMUNITY): Payer: Medicare Other

## 2015-03-22 DIAGNOSIS — K4041 Unilateral inguinal hernia, with gangrene, recurrent: Secondary | ICD-10-CM

## 2015-03-22 LAB — CBC
HEMATOCRIT: 36.4 % — AB (ref 39.0–52.0)
HEMOGLOBIN: 12.2 g/dL — AB (ref 13.0–17.0)
MCH: 30.3 pg (ref 26.0–34.0)
MCHC: 33.5 g/dL (ref 30.0–36.0)
MCV: 90.3 fL (ref 78.0–100.0)
Platelets: 45 10*3/uL — ABNORMAL LOW (ref 150–400)
RBC: 4.03 MIL/uL — AB (ref 4.22–5.81)
RDW: 15.2 % (ref 11.5–15.5)
WBC: 1.1 10*3/uL — AB (ref 4.0–10.5)

## 2015-03-22 LAB — FIBRINOGEN: Fibrinogen: 309 mg/dL (ref 204–475)

## 2015-03-22 LAB — MAGNESIUM: MAGNESIUM: 1.9 mg/dL (ref 1.7–2.4)

## 2015-03-22 LAB — GLUCOSE, CAPILLARY
GLUCOSE-CAPILLARY: 102 mg/dL — AB (ref 65–99)
Glucose-Capillary: 85 mg/dL (ref 65–99)
Glucose-Capillary: 111 mg/dL — ABNORMAL HIGH (ref 65–99)

## 2015-03-22 LAB — MRSA PCR SCREENING: MRSA BY PCR: NEGATIVE

## 2015-03-22 LAB — PHOSPHORUS: Phosphorus: 2.8 mg/dL (ref 2.5–4.6)

## 2015-03-22 MED ORDER — VANCOMYCIN HCL 500 MG IV SOLR
500.0000 mg | Freq: Two times a day (BID) | INTRAVENOUS | Status: DC
  Administered 2015-03-22 – 2015-03-23 (×3): 500 mg via INTRAVENOUS
  Filled 2015-03-22 (×4): qty 500

## 2015-03-22 NOTE — Progress Notes (Signed)
CRITICAL VALUE ALERT  Critical value received:  WCB 1.1  Date of notification:  03/22/15  Time of notification:  0550  Critical value read back:Yes.    Nurse who received alert:  Charmayne Sheer  MD notified (1st page):  Tamala Julian  Time of first page:  0550  MD notified (2nd page):  Time of second page:  Responding MD:  Tamala Julian  Time MD responded:  [phone]

## 2015-03-22 NOTE — Consult Note (Signed)
Reason for Consult:Right inguinal hernia Referring Physician: Dr. Annamarie Dawley is an 81 y.o. male.  HPI: Kevin Rubio is a pleasant 81 y/o man with high functional status but history of longstanding leukopenia, thrombocytopenia (marrow bx in 2010 unrevealing), atrial fibrillation, CAD with remote hx of MI, as well RA who presents to the ED with a 1-2 week history of progressive weakness, chills, and urinary frequncey.  Currently being treated for UTI sepsis.  Pt with RIH on CT and surgical consultation for further possible manamgement  Past Medical History  Diagnosis Date  . ANEMIA, PERNICIOUS 05/14/2007  . Atrial fibrillation 08/08/2007  . BENIGN PROSTATIC HYPERTROPHY 03/05/2007  . CORONARY ARTERY DISEASE 03/05/2007  . DYSPLASTIC NEVUS 01/24/2008  . HYPERLIPIDEMIA 03/05/2007  . HYPERTENSION 03/05/2007  . MYOCARDIAL INFARCTION, HX OF 03/05/2007    1982  . NEUTROPENIA NOS 05/11/2007  . Rheumatoid arthritis(714.0) 03/05/2007  . VITAMIN B12 DEFICIENCY 08/08/2007  . Current use of long term anticoagulation   . Rheumatoid arthritis(714.0)   . Hernia     umbilical    Past Surgical History  Procedure Laterality Date  . Laparoscopic cholecystectomy  11/09/2001    Dr Lennie Hummer  . Nasal polyp surgery    . Ptca  15 yrs ago  . External ear surgery  2 years ago  . Laparoscopic inguinal hernia repair  01/27/11    right direct, Dr Greer Pickerel  . Hernia repair  2012    No family history on file.  Social History:  reports that he quit smoking about 36 years ago. His smoking use included Cigarettes. He has never used smokeless tobacco. He reports that he does not drink alcohol or use illicit drugs.  Allergies:  Allergies  Allergen Reactions  . Cefpodoxime Proxetil Other (See Comments)    unknown    Medications: I have reviewed the patient's current medications.  Results for orders placed or performed during the hospital encounter of 03/21/15 (from the past 48 hour(s))  CBC with  Differential     Status: Abnormal   Collection Time: 03/21/15  4:35 PM  Result Value Ref Range   WBC 2.0 (L) 4.0 - 10.5 K/uL    Comment: WHITE COUNT CONFIRMED ON SMEAR   RBC 4.17 (L) 4.22 - 5.81 MIL/uL   Hemoglobin 12.8 (L) 13.0 - 17.0 g/dL   HCT 37.2 (L) 39.0 - 52.0 %   MCV 89.2 78.0 - 100.0 fL   MCH 30.7 26.0 - 34.0 pg   MCHC 34.4 30.0 - 36.0 g/dL   RDW 14.8 11.5 - 15.5 %   Platelets 51 (L) 150 - 400 K/uL    Comment: REPEATED TO VERIFY SPECIMEN CHECKED FOR CLOTS PLATELET COUNT CONFIRMED BY SMEAR    Neutrophils Relative % 69 43 - 77 %   Lymphocytes Relative 17 12 - 46 %   Monocytes Relative 14 (H) 3 - 12 %   Eosinophils Relative 0 0 - 5 %   Basophils Relative 0 0 - 1 %   Neutro Abs 1.4 (L) 1.7 - 7.7 K/uL   Lymphs Abs 0.3 (L) 0.7 - 4.0 K/uL   Monocytes Absolute 0.3 0.1 - 1.0 K/uL   Eosinophils Absolute 0.0 0.0 - 0.7 K/uL   Basophils Absolute 0.0 0.0 - 0.1 K/uL   WBC Morphology TOXIC GRANULATION   Brain natriuretic peptide     Status: Abnormal   Collection Time: 03/21/15  4:35 PM  Result Value Ref Range   B Natriuretic Peptide 272.4 (H) 0.0 -  100.0 pg/mL  Protime-INR     Status: Abnormal   Collection Time: 03/21/15  4:35 PM  Result Value Ref Range   Prothrombin Time 34.9 (H) 11.6 - 15.2 seconds   INR 3.57 (H) 0.00 - 1.49  I-stat troponin, ED     Status: None   Collection Time: 03/21/15  4:40 PM  Result Value Ref Range   Troponin i, poc 0.00 0.00 - 0.08 ng/mL   Comment 3            Comment: Due to the release kinetics of cTnI, a negative result within the first hours of the onset of symptoms does not rule out myocardial infarction with certainty. If myocardial infarction is still suspected, repeat the test at appropriate intervals.   I-stat Chem 8, ED     Status: Abnormal   Collection Time: 03/21/15  4:42 PM  Result Value Ref Range   Sodium 135 135 - 145 mmol/L   Potassium 4.4 3.5 - 5.1 mmol/L   Chloride 104 101 - 111 mmol/L   BUN 17 6 - 20 mg/dL   Creatinine,  Ser 1.20 0.61 - 1.24 mg/dL   Glucose, Bld 102 (H) 65 - 99 mg/dL   Calcium, Ion 1.08 (L) 1.13 - 1.30 mmol/L   TCO2 17 0 - 100 mmol/L   Hemoglobin 12.9 (L) 13.0 - 17.0 g/dL   HCT 38.0 (L) 39.0 - 52.0 %  I-Stat CG4 Lactic Acid, ED     Status: None   Collection Time: 03/21/15  4:43 PM  Result Value Ref Range   Lactic Acid, Venous 0.90 0.5 - 2.0 mmol/L  Urinalysis, Routine w reflex microscopic (not at [facility])     Status: Abnormal   Collection Time: 03/21/15  5:00 PM  Result Value Ref Range   Color, Urine AMBER (A) YELLOW    Comment: BIOCHEMICALS MAY BE AFFECTED BY COLOR   APPearance CLOUDY (A) CLEAR   Specific Gravity, Urine 1.022 1.005 - 1.030   pH 7.5 5.0 - 8.0   Glucose, UA NEGATIVE NEGATIVE mg/dL   Hgb urine dipstick SMALL (A) NEGATIVE   Bilirubin Urine NEGATIVE NEGATIVE   Ketones, ur 15 (A) NEGATIVE mg/dL   Protein, ur 100 (A) NEGATIVE mg/dL   Urobilinogen, UA 1.0 0.0 - 1.0 mg/dL   Nitrite POSITIVE (A) NEGATIVE   Leukocytes, UA SMALL (A) NEGATIVE  Urine microscopic-add on     Status: Abnormal   Collection Time: 03/21/15  5:00 PM  Result Value Ref Range   Squamous Epithelial / LPF RARE RARE   WBC, UA 7-10 <3 WBC/hpf   RBC / HPF 0-2 <3 RBC/hpf   Bacteria, UA MANY (A) RARE   Casts GRANULAR CAST (A) NEGATIVE  Urine culture     Status: None (Preliminary result)   Collection Time: 03/21/15  5:00 PM  Result Value Ref Range   Specimen Description URINE, CLEAN CATCH    Special Requests NONE    Culture CULTURE REINCUBATED FOR BETTER GROWTH    Report Status PENDING   Blood Culture (routine x 2)     Status: None (Preliminary result)   Collection Time: 03/21/15  6:32 PM  Result Value Ref Range   Specimen Description BLOOD RIGHT FOREARM    Special Requests BOTTLES DRAWN AEROBIC AND ANAEROBIC 5ML    Culture NO GROWTH < 24 HOURS    Report Status PENDING   Blood Culture (routine x 2)     Status: None (Preliminary result)   Collection Time: 03/21/15  7:22 PM  Result Value Ref Range    Specimen Description BLOOD RIGHT HAND    Special Requests      BOTTLES DRAWN AEROBIC ONLY 5CC PT ON ZOSYN,VANCOMYCIN   Culture NO GROWTH < 24 HOURS    Report Status PENDING   I-Stat CG4 Lactic Acid, ED     Status: None   Collection Time: 03/21/15  7:22 PM  Result Value Ref Range   Lactic Acid, Venous 1.09 0.5 - 2.0 mmol/L  Type and screen     Status: None   Collection Time: 03/21/15  8:44 PM  Result Value Ref Range   ABO/RH(D) O POS    Antibody Screen NEG    Sample Expiration 03/24/2015   ABO/Rh     Status: None   Collection Time: 03/21/15  8:48 PM  Result Value Ref Range   ABO/RH(D) O POS   Glucose, capillary     Status: None   Collection Time: 03/21/15  9:37 PM  Result Value Ref Range   Glucose-Capillary 90 65 - 99 mg/dL  Fibrinogen     Status: None   Collection Time: 03/21/15 10:56 PM  Result Value Ref Range   Fibrinogen 309 204 - 475 mg/dL  Magnesium     Status: None   Collection Time: 03/21/15 10:56 PM  Result Value Ref Range   Magnesium 1.8 1.7 - 2.4 mg/dL  Phosphorus     Status: None   Collection Time: 03/21/15 10:56 PM  Result Value Ref Range   Phosphorus 2.8 2.5 - 4.6 mg/dL  Lipase, blood     Status: None   Collection Time: 03/21/15 10:56 PM  Result Value Ref Range   Lipase 33 22 - 51 U/L  Comprehensive metabolic panel     Status: Abnormal   Collection Time: 03/21/15 10:56 PM  Result Value Ref Range   Sodium 134 (L) 135 - 145 mmol/L   Potassium 3.7 3.5 - 5.1 mmol/L   Chloride 109 101 - 111 mmol/L   CO2 22 22 - 32 mmol/L   Glucose, Bld 106 (H) 65 - 99 mg/dL   BUN 12 6 - 20 mg/dL   Creatinine, Ser 1.06 0.61 - 1.24 mg/dL   Calcium 7.1 (L) 8.9 - 10.3 mg/dL   Total Protein 6.4 (L) 6.5 - 8.1 g/dL   Albumin 1.8 (L) 3.5 - 5.0 g/dL   AST 45 (H) 15 - 41 U/L   ALT 19 17 - 63 U/L   Alkaline Phosphatase 91 38 - 126 U/L   Total Bilirubin 0.9 0.3 - 1.2 mg/dL   GFR calc non Af Amer 59 (L) >60 mL/min   GFR calc Af Amer >60 >60 mL/min    Comment: (NOTE) The eGFR has  been calculated using the CKD EPI equation. This calculation has not been validated in all clinical situations. eGFR's persistently <60 mL/min signify possible Chronic Kidney Disease.    Anion gap 3 (L) 5 - 15  MRSA PCR Screening     Status: None   Collection Time: 03/22/15 12:03 AM  Result Value Ref Range   MRSA by PCR NEGATIVE NEGATIVE    Comment:        The GeneXpert MRSA Assay (FDA approved for NASAL specimens only), is one component of a comprehensive MRSA colonization surveillance program. It is not intended to diagnose MRSA infection nor to guide or monitor treatment for MRSA infections.   CBC     Status: Abnormal   Collection Time: 03/22/15  5:09 AM  Result Value Ref Range  WBC 1.1 (LL) 4.0 - 10.5 K/uL    Comment: REPEATED TO VERIFY CRITICAL RESULT CALLED TO, READ BACK BY AND VERIFIED WITH: SMITH,A RN [id number] 03/22/15 TEETERN    RBC 4.03 (L) 4.22 - 5.81 MIL/uL   Hemoglobin 12.2 (L) 13.0 - 17.0 g/dL   HCT 36.4 (L) 39.0 - 52.0 %   MCV 90.3 78.0 - 100.0 fL   MCH 30.3 26.0 - 34.0 pg   MCHC 33.5 30.0 - 36.0 g/dL   RDW 15.2 11.5 - 15.5 %   Platelets 45 (L) 150 - 400 K/uL    Comment: CONSISTENT WITH PREVIOUS RESULT  Magnesium     Status: None   Collection Time: 03/22/15  5:09 AM  Result Value Ref Range   Magnesium 1.9 1.7 - 2.4 mg/dL  Phosphorus     Status: None   Collection Time: 03/22/15  5:09 AM  Result Value Ref Range   Phosphorus 2.8 2.5 - 4.6 mg/dL  Glucose, capillary     Status: None   Collection Time: 03/22/15  8:02 AM  Result Value Ref Range   Glucose-Capillary 85 65 - 99 mg/dL  Glucose, capillary     Status: Abnormal   Collection Time: 03/22/15 12:13 PM  Result Value Ref Range   Glucose-Capillary 111 (H) 65 - 99 mg/dL    Ct Abdomen Pelvis W Contrast  03/21/2015   CLINICAL DATA:  Evaluate right hernia.  Weakness for 3 days.  EXAM: CT ABDOMEN AND PELVIS WITH CONTRAST  TECHNIQUE: Multidetector CT imaging of the abdomen and pelvis was performed using the  standard protocol following bolus administration of intravenous contrast.  CONTRAST:  185m OMNIPAQUE IOHEXOL 300 MG/ML  SOLN  COMPARISON:  None.  FINDINGS: Right basilar dependent opacity could reflect atelectasis or infiltrate. Suspect underlying COPD. No effusions. Heart is normal size.  Prior cholecystectomy. No focal hepatic abnormality. The spleen is markedly enlarged, measuring 20 cm in craniocaudal length. Pancreas, adrenals and kidneys are unremarkable. Small cysts in the lower pole of the right kidney. No hydronephrosis.  Stomach, aorta and iliac vessels are normal caliber.  No adenopathy.  No acute bony abnormality or focal bone lesion. Small bowel are unremarkable. There is a small right inguinal hernia containing fat. Fluid within the right inguinal canal as well. Small amount of free fluid in the cul-de-sac of the pelvis. There sigmoid diverticulosis. No active diverticulitis.  IMPRESSION: Marked splenomegaly with a craniocaudal length of 20 cm.  Small right inguinal hernia containing fat and fluid. Small amount of free fluid also in the cul-de-sac of the pelvis.  Right dependent basilar atelectasis or infiltrate.  Sigmoid diverticulosis.   Electronically Signed   By: KRolm BaptiseM.D.   On: 03/21/2015 18:08   Dg Chest Portable 1 View  03/21/2015   CLINICAL DATA:  Shortness of breath and weakness for 3 days. Anticoagulation for atrial fibrillation.  EXAM: PORTABLE CHEST - 1 VIEW  COMPARISON:  09/17/2014  FINDINGS: A single AP portable view of the chest demonstrates no focal airspace consolidation or alveolar edema. The lungs are grossly clear. There is no large effusion or pneumothorax. Cardiac and mediastinal contours appear unremarkable.  IMPRESSION: No active disease.   Electronically Signed   By: DAndreas NewportM.D.   On: 03/21/2015 17:22    Review of Systems  Constitutional: Negative.  Negative for weight loss.  HENT: Negative for ear discharge, ear pain, hearing loss and tinnitus.    Eyes: Negative for blurred vision, double vision, photophobia and pain.  Respiratory:  Negative for cough, sputum production and shortness of breath.   Cardiovascular: Negative for chest pain.  Gastrointestinal: Negative for nausea, vomiting and abdominal pain.  Genitourinary: Negative for dysuria, urgency, frequency and flank pain.  Musculoskeletal: Negative for myalgias, back pain, joint pain, falls and neck pain.  Neurological: Negative for dizziness, tingling, sensory change, focal weakness, loss of consciousness and headaches.  Endo/Heme/Allergies: Does not bruise/bleed easily.  Psychiatric/Behavioral: Negative for depression, memory loss and substance abuse. The patient is not nervous/anxious.    Blood pressure 116/77, pulse 63, temperature 97.4 F (36.3 C), temperature source Oral, resp. rate 17, height _0  (1.702 m), weight 67.6 kg (149 lb 0.5 oz), SpO2 98 %. Physical Exam  Constitutional: He is oriented to person, place, and time. He appears well-developed and well-nourished.  HENT:  Head: Normocephalic and atraumatic.  Eyes: Conjunctivae and EOM are normal. Pupils are equal, round, and reactive to light.  Neck: Normal range of motion. Neck supple.  Cardiovascular: Normal rate and regular rhythm.   Respiratory: Effort normal and breath sounds normal.  GI: Soft. Bowel sounds are normal. He exhibits distension. There is tenderness. There is rebound and guarding.  R small fat containing inguinal hernia   Genitourinary: Penis normal.  R testical with firmness, calcificaiton   Musculoskeletal: Normal range of motion.  Neurological: He is alert and oriented to person, place, and time.    Assessment/Plan: 81 y/o M with MMP and current UTI and small reducible RIH and possible R testicular mass. 1. Small RIH with no incarceration, self reducible. 2. Possible mass to Right testicle.  [place] for further work up, [employer] consult  No need for any urgent/emergent surgical repair of  hernia.  Given age and MMP would recommend [facility] as outpt for comfort.  Call back if needed.  Rosario Jacks., Kevin Rubio 03/22/2015, 1:48 PM

## 2015-03-22 NOTE — Progress Notes (Signed)
Utilization Review Completed.Mj Willis T7/31/2016  

## 2015-03-22 NOTE — Progress Notes (Signed)
ANTIBIOTIC CONSULT NOTE - INITIAL  Pharmacy Consult for Vancomycin, Zosyn (d/c levaquin) Indication: rule out pneumonia, febrile neutropenia  Allergies  Allergen Reactions  . Cefpodoxime Proxetil Other (See Comments)    unknown    Patient Measurements: Height: 5\' 7"  (170.2 cm) Weight: 149 lb 0.5 oz (67.6 kg) IBW/kg (Calculated) : 66.1 Adjusted Body Weight: n/a 67 kg in 12/2014  Vital Signs: Temp: 97.4 F (36.3 C) (07/31 0805) Temp Source: Oral (07/31 0805) BP: 105/60 mmHg (07/31 0900) Pulse Rate: 83 (07/31 0900) Intake/Output from previous day: 07/30 0701 - 07/31 0700 In: 3050 [I.V.:3000; IV Piggyback:50] Out: 600 [Urine:600] Intake/Output from this shift: Total I/O In: -  Out: 220 [Urine:220]  Labs:  Recent Labs  03/21/15 1635 03/21/15 1642 03/21/15 2256 03/22/15 0509  WBC 2.0*  --   --  1.1*  HGB 12.8* 12.9*  --  12.2*  PLT 51*  --   --  45*  CREATININE  --  1.20 1.06  --    Estimated Creatinine Clearance: 42.4 mL/min (by C-G formula based on Cr of 1.06). No results for input(s): VANCOTROUGH, VANCOPEAK, VANCORANDOM, GENTTROUGH, GENTPEAK, GENTRANDOM, TOBRATROUGH, TOBRAPEAK, TOBRARND, AMIKACINPEAK, AMIKACINTROU, AMIKACIN in the last 72 hours.   Microbiology: Recent Results (from the past 720 hour(s))  Urine culture     Status: None (Preliminary result)   Collection Time: 03/21/15  5:00 PM  Result Value Ref Range Status   Specimen Description URINE, CLEAN CATCH  Final   Special Requests NONE  Final   Culture CULTURE REINCUBATED FOR BETTER GROWTH  Final   Report Status PENDING  Incomplete  MRSA PCR Screening     Status: None   Collection Time: 03/22/15 12:03 AM  Result Value Ref Range Status   MRSA by PCR NEGATIVE NEGATIVE Final    Comment:        The GeneXpert MRSA Assay (FDA approved for NASAL specimens only), is one component of a comprehensive MRSA colonization surveillance program. It is not intended to diagnose MRSA infection nor to guide  or monitor treatment for MRSA infections.     Medical History: Past Medical History  Diagnosis Date  . ANEMIA, PERNICIOUS 05/14/2007  . Atrial fibrillation 08/08/2007  . BENIGN PROSTATIC HYPERTROPHY 03/05/2007  . CORONARY ARTERY DISEASE 03/05/2007  . DYSPLASTIC NEVUS 01/24/2008  . HYPERLIPIDEMIA 03/05/2007  . HYPERTENSION 03/05/2007  . MYOCARDIAL INFARCTION, HX OF 03/05/2007    1982  . NEUTROPENIA NOS 05/11/2007  . Rheumatoid arthritis(714.0) 03/05/2007  . VITAMIN B12 DEFICIENCY 08/08/2007  . Current use of long term anticoagulation   . Rheumatoid arthritis(714.0)   . Hernia     umbilical    Assessment: 81 yo male admitted with SOB, pharmacy asked to begin empiric coverage with Levaquin.  Also with orders for vancomycin and zosyn x 1 dose per ED MD.  Estimated CrCl ~ 40 ml/min.  7/31 Pharmacy asked to d/c levaquin and begin vancomycin and zosyn for febrile neutropenia.  Goal of Therapy:  resolution of infection  Plan:  1. Vancomycin 500 mg IV q 12 hrs. 2. Continue Zosyn 3.375g IV q 8 hrs. 3. F/u cultures. 4. Vancomycin trough at steady state.  Uvaldo Rising, BCPS  Clinical Pharmacist Pager [phone]  03/22/2015 11:51 AM

## 2015-03-22 NOTE — Progress Notes (Addendum)
PULMONARY / CRITICAL CARE MEDICINE HISTORY AND PHYSICAL EXAMINATION   Name: Kevin Rubio MRN: [id number] DOB: 09-Mar-1922    ADMISSION DATE:  03/21/2015  PRIMARY SERVICE: PCCM  CHIEF COMPLAINT:  Urinary Frequency, weakness  BRIEF PATIENT DESCRIPTION: 81 y/o man with leukopenia and thrombocytopenia who presents to the ED with severe sepsis of urinary origin and likely hernia.  SIGNIFICANT EVENTS / STUDIES:  CT Abd: Small right inguinal hernia containing fat and fluid. Small amount of free fluid also in the cul-de-sac of the pelvis. U/A: Suggestive of UTI  LINES / TUBES: PIV  CULTURES: Urine: 7/30 pending Blood: 7/30 x2 pending  ANTIBIOTICS: Vanc 7/30 --> Zosyn 7/30 -->  HISTORY OF PRESENT ILLNESS:   Kevin Rubio is a pleasant 81 y/o man with high functional status but history of longstanding leukopenia, thrombocytopenia (marrow bx in 2010 unrevealing), atrial fibrillation, CAD with remote hx of MI, as well RA who presents to the ED with a 1-2 week history of progressive weakness, chills, and urinary frequncey. He denied fevers, but endorsed chills. However, on presentation, he was found to be frebrile. He has had poor PO intake, but has been complaint with his medication regimen (including warfarin). He eventually presented to the ED as his weakness progressed to the point when he was unable to get out of bed. At baseline, he is fully independent for ADLs, and still drives short distances per his PCP note. He also reports some scrotal swelling and pain.  PAST MEDICAL HISTORY :  Past Medical History  Diagnosis Date  . ANEMIA, PERNICIOUS 05/14/2007  . Atrial fibrillation 08/08/2007  . BENIGN PROSTATIC HYPERTROPHY 03/05/2007  . CORONARY ARTERY DISEASE 03/05/2007  . DYSPLASTIC NEVUS 01/24/2008  . HYPERLIPIDEMIA 03/05/2007  . HYPERTENSION 03/05/2007  . MYOCARDIAL INFARCTION, HX OF 03/05/2007    1982  . NEUTROPENIA NOS 05/11/2007  . Rheumatoid arthritis(714.0) 03/05/2007  . VITAMIN B12  DEFICIENCY 08/08/2007  . Current use of long term anticoagulation   . Rheumatoid arthritis(714.0)   . Hernia     umbilical   Past Surgical History  Procedure Laterality Date  . Laparoscopic cholecystectomy  11/09/2001    Dr Lennie Hummer  . Nasal polyp surgery    . Ptca  15 yrs ago  . External ear surgery  2 years ago  . Laparoscopic inguinal hernia repair  01/27/11    right direct, Dr Greer Pickerel  . Hernia repair  2012   Prior to Admission medications   Medication Sig Start Date End Date Taking? Authorizing Provider  acetaminophen (TYLENOL) 500 MG tablet Take 1,000 mg by mouth daily as needed for mild pain.   Yes Historical Provider, MD  ascorbic Acid (VITAMIN C) 500 MG CPCR Take 500 mg by mouth daily.     Yes Historical Provider, MD  cyanocobalamin (,VITAMIN B-12,) 1000 MCG/ML injection Inject 1,000 mcg into the muscle every 30 (thirty) days.   Yes Historical Provider, MD  Ferrous Sulfate (IRON CR PO) Take 65 mg by mouth daily.     Yes Historical Provider, MD  guaiFENesin (MUCINEX) 600 MG 12 hr tablet Take 600 mg by mouth daily.    Yes Historical Provider, MD  levothyroxine (SYNTHROID, LEVOTHROID) 25 MCG tablet TAKE ONE TABLET BY MOUTH ONCE DAILY BEFORE  BREAKFAST 11/28/14  Yes Marletta Lor, MD  metoprolol tartrate (LOPRESSOR) 25 MG tablet Take 25-50 mg by mouth 2 (two) times daily. Two tablets in the morning, and one tablet in the afternoon 10/12/10  Yes Marletta Lor, MD  Multiple Vitamins-Minerals (CENTRUM SILVER PO) Take 1 tablet by mouth daily.    Yes Historical Provider, MD  Omega-3 Fatty Acids (FISH OIL) 1000 MG CAPS Take 2 capsules by mouth daily.     Yes Historical Provider, MD  pravastatin (PRAVACHOL) 40 MG tablet TAKE ONE TABLET BY MOUTH ONCE DAILY 10/13/14  Yes Marletta Lor, MD  warfarin (COUMADIN) 4 MG tablet Take 4-6 mg by mouth daily. 6 mg on Thurs and Sunday.  4 mg all other days.   Yes Historical Provider, MD  ofloxacin (FLOXIN) 0.3 % otic solution Place 5  drops into the left ear 2 (two) times daily. Patient not taking: Reported on 03/21/2015 08/19/14   Bruce W Burchette, MD   Allergies  Allergen Reactions  . Cefpodoxime Proxetil Other (See Comments)    unknown    FAMILY HISTORY:  No family history on file. SOCIAL HISTORY:  reports that he quit smoking about 36 years ago. His smoking use included Cigarettes. He has never used smokeless tobacco. He reports that he does not drink alcohol or use illicit drugs.  REVIEW OF SYSTEMS:  Urinary frequency, scrotal pain.  SUBJECTIVE:   VITAL SIGNS: Temp:  [97.4 F (36.3 C)-101.2 F (38.4 C)] 97.4 F (36.3 C) (07/31 0805) Pulse Rate:  [32-157] 83 (07/31 0900) Resp:  [17-40] 22 (07/31 0900) BP: (75-129)/(44-91) 105/60 mmHg (07/31 0900) SpO2:  [92 %-100 %] 95 % (07/31 0900) Weight:  [149 lb 0.5 oz (67.6 kg)] 149 lb 0.5 oz (67.6 kg) (07/30 2130) HEMODYNAMICS:   VENTILATOR SETTINGS:   INTAKE / OUTPUT: Intake/Output      07 /30 0701 - 07/31 0700 07/31 0701 - 08/01 0700   I.V. (mL/kg) 3000 (44.4)    IV Piggyback 50    Total Intake(mL/kg) 3050 (45.1)    Urine (mL/kg/hr) 600 220 (0.9)   Stool 0    Total Output 600 220   Net +2450 -220        Stool Occurrence 2 x      PHYSICAL EXAMINATION: General:  Elderly man awake, pleasant and cooperative. NAD Neuro:  Decreased hearing acuity. HEENT:  Dry MM. Neck: No masses Cardiovascular:  Irregular rate Lungs:  CTAB. Abdomen:  Soft, palpable mass within scrotom extending down from inguinal canal. Musculoskeletal:  Intact. Skin:  No rashes.  LABS:  CBC  Recent Labs Lab 03/21/15 1635 03/21/15 1642 03/22/15 0509  WBC 2.0*  --  1.1*  HGB 12.8* 12.9* 12.2*  HCT 37.2* 38.0* 36.4*  PLT 51*  --  45*   Coag's  Recent Labs Lab 03/21/15 1635  INR 3.57*   BMET  Recent Labs Lab 03/21/15 1642 03/21/15 2256  NA 135 134*  K 4.4 3.7  CL 104 109  CO2  --  22  BUN 17 12  CREATININE 1.20 1.06  GLUCOSE 102* 106*    Electrolytes  Recent Labs Lab 03/21/15 2256 03/22/15 0509  CALCIUM 7.1*  --   MG 1.8 1.9  PHOS 2.8 2.8   Sepsis Markers  Recent Labs Lab 03/21/15 1643 03/21/15 1922  LATICACIDVEN 0.90 1.09   ABG No results for input(s): PHART, PCO2ART, PO2ART in the last 168 hours. Liver Enzymes  Recent Labs Lab 03/21/15 2256  AST 45*  ALT 19  ALKPHOS 91  BILITOT 0.9  ALBUMIN 1.8*   Cardiac Enzymes No results for input(s): TROPONINI, PROBNP in the last 168 hours. Glucose  Recent Labs Lab 03/21/15 2137 03/22/15 0802  GLUCAP 90 85    Imaging Ct Abdomen Pelvis  W Contrast  03/21/2015   CLINICAL DATA:  Evaluate right hernia.  Weakness for 3 days.  EXAM: CT ABDOMEN AND PELVIS WITH CONTRAST  TECHNIQUE: Multidetector CT imaging of the abdomen and pelvis was performed using the standard protocol following bolus administration of intravenous contrast.  CONTRAST:  117mL OMNIPAQUE IOHEXOL 300 MG/ML  SOLN  COMPARISON:  None.  FINDINGS: Right basilar dependent opacity could reflect atelectasis or infiltrate. Suspect underlying COPD. No effusions. Heart is normal size.  Prior cholecystectomy. No focal hepatic abnormality. The spleen is markedly enlarged, measuring 20 cm in craniocaudal length. Pancreas, adrenals and kidneys are unremarkable. Small cysts in the lower pole of the right kidney. No hydronephrosis.  Stomach, aorta and iliac vessels are normal caliber.  No adenopathy.  No acute bony abnormality or focal bone lesion. Small bowel are unremarkable. There is a small right inguinal hernia containing fat. Fluid within the right inguinal canal as well. Small amount of free fluid in the cul-de-sac of the pelvis. There sigmoid diverticulosis. No active diverticulitis.  IMPRESSION: Marked splenomegaly with a craniocaudal length of 20 cm.  Small right inguinal hernia containing fat and fluid. Small amount of free fluid also in the cul-de-sac of the pelvis.  Right dependent basilar atelectasis or  infiltrate.  Sigmoid diverticulosis.   Electronically Signed   By: Rolm Baptise M.D.   On: 03/21/2015 18:08   Dg Chest Portable 1 View  03/21/2015   CLINICAL DATA:  Shortness of breath and weakness for 3 days. Anticoagulation for atrial fibrillation.  EXAM: PORTABLE CHEST - 1 VIEW  COMPARISON:  09/17/2014  FINDINGS: A single AP portable view of the chest demonstrates no focal airspace consolidation or alveolar edema. The lungs are grossly clear. There is no large effusion or pneumothorax. Cardiac and mediastinal contours appear unremarkable.  IMPRESSION: No active disease.   Electronically Signed   By: Andreas Newport M.D.   On: 03/21/2015 17:22    EKG: Afib CXR: Unremarkable   ASSESSMENT / PLAN:  Active Problems:   Severe sepsis   PULMONARY A: No active issues P:    CARDIOVASCULAR A: Atrial Fibrillation Hypotension due to hypovolemia P:   - rate improved with hydration. Suspect severe sepsis and decreased PO intake as cause for low circulating volume and hypotension. Better with hydration.  RENAL Lab Results  Component Value Date   CREATININE 1.06 03/21/2015   CREATININE 1.20 03/21/2015   CREATININE 1.1 01/15/2015   CREATININE 0.98 09/20/2014   CREATININE 1.1 07/10/2014   CREATININE 1.1 01/08/2014    A: AKI P:   In geriatric population, BUN and Cr underestimate degree of renal impairment. Both are slightly elevated from baseline, and clinically, he is dehydrated. Will provide gentle hydration and carefully monitor labs.  GASTROINTESTINAL A: Possible hernia P:   CT did not show bowel in inguinal canal, but will obtain U/S to better evaluate. Will surgical consultation prot to dc.  HEMATOLOGIC A: Leukopenia (chronic) Thrombocytopenia (chronic) Anemia (chronic) Supratheraputic INR P:   Appears unchanged from baseline. Is s/p BM bx in 2010 which was unremarkable. Could consider repeat, but unclear benefit since is unchanged. Holding coumadin for  now.  INFECTIOUS A: Severe Sepsis Neutropenic fever presumed .due to UTI P:   Improved with fluids Vanc/zosyn 7/30>> Need to evaluate scrotal process(US ordered 7/31)  as may be related, although clinically is most consistent with sepsis of urinary origin.  ENDOCRINE CBG (last 3)   Recent Labs  03/21/15 2137 03/22/15 0802  GLUCAP 90 85  A: CBGs with meals and QHS P:   Insulin per sensitive protocol.  NEUROLOGIC A: No deficits. HOH P:   n/a   TODAY'S SUMMARY:  81 yo, nad at rest, HOH but follows commands. Consider tranafer to SDU and to [facility]. He will need surgical eval of inguinal hernia prior to discharge.   Kevin Rubio ACNP Maryanna Shape PCCM Pager [phone] till 3 pm If no answer page [phone] 03/22/2015, 10:35 AM

## 2015-03-23 DIAGNOSIS — N451 Epididymitis: Secondary | ICD-10-CM

## 2015-03-23 DIAGNOSIS — R6520 Severe sepsis without septic shock: Secondary | ICD-10-CM

## 2015-03-23 DIAGNOSIS — K4090 Unilateral inguinal hernia, without obstruction or gangrene, not specified as recurrent: Secondary | ICD-10-CM

## 2015-03-23 LAB — CBC
HCT: 33.4 % — ABNORMAL LOW (ref 39.0–52.0)
Hemoglobin: 11.3 g/dL — ABNORMAL LOW (ref 13.0–17.0)
MCH: 30.1 pg (ref 26.0–34.0)
MCHC: 33.8 g/dL (ref 30.0–36.0)
MCV: 88.8 fL (ref 78.0–100.0)
PLATELETS: 53 10*3/uL — AB (ref 150–400)
RBC: 3.76 MIL/uL — AB (ref 4.22–5.81)
RDW: 15.1 % (ref 11.5–15.5)
WBC: 1.2 10*3/uL — AB (ref 4.0–10.5)

## 2015-03-23 LAB — BASIC METABOLIC PANEL
Anion gap: 3 — ABNORMAL LOW (ref 5–15)
BUN: 18 mg/dL (ref 6–20)
CO2: 22 mmol/L (ref 22–32)
CREATININE: 1.18 mg/dL (ref 0.61–1.24)
Calcium: 7.7 mg/dL — ABNORMAL LOW (ref 8.9–10.3)
Chloride: 109 mmol/L (ref 101–111)
GFR calc Af Amer: >60 mL/min (ref >60)
GFR calc non Af Amer: 52 mL/min — ABNORMAL LOW (ref >60)
Glucose, Bld: 107 mg/dL — ABNORMAL HIGH (ref 65–99)
POTASSIUM: 3.8 mmol/L (ref 3.5–5.1)
Sodium: 134 mmol/L — ABNORMAL LOW (ref 135–145)

## 2015-03-23 LAB — PHOSPHORUS: Phosphorus: 2.6 mg/dL (ref 2.5–4.6)

## 2015-03-23 LAB — PROTIME-INR
INR: 3.52 — AB (ref 0.00–1.49)
Prothrombin Time: 34.5 seconds — ABNORMAL HIGH (ref 11.6–15.2)

## 2015-03-23 LAB — GLUCOSE, CAPILLARY
GLUCOSE-CAPILLARY: 83 mg/dL (ref 65–99)
GLUCOSE-CAPILLARY: 115 mg/dL — AB (ref 65–99)
Glucose-Capillary: 99 mg/dL (ref 65–99)
Glucose-Capillary: 80 mg/dL (ref 65–99)

## 2015-03-23 LAB — MAGNESIUM: Magnesium: 1.8 mg/dL (ref 1.7–2.4)

## 2015-03-23 MED ORDER — SODIUM CHLORIDE 0.9 % IV SOLN: 250.0000 mL | INTRAVENOUS | Status: DC | PRN

## 2015-03-23 NOTE — Progress Notes (Signed)
ANTICOAGULATION CONSULT NOTE - Initial Consult  Pharmacy Consult for warfarin Indication: atrial fibrillation  Allergies  Allergen Reactions  . Cefpodoxime Proxetil Other (See Comments)    unknown    Patient Measurements: Height: 5\' 7"  (170.2 cm) Weight: 69 lb 4.8 oz (31.434 kg) IBW/kg (Calculated) : 66.1  Vital Signs: Temp: 97.8 F (36.6 C) (08/01 2000) Temp Source: Oral (08/01 2000) BP: 115/54 mmHg (08/01 2000) Pulse Rate: 75 (08/01 2000)  Labs:  Recent Labs  03/21/15 1635 03/21/15 1642 03/21/15 2256 03/22/15 0509 03/23/15 0209 03/23/15 1955  HGB 12.8* 12.9*  --  12.2* 11.3*  --   HCT 37.2* 38.0*  --  36.4* 33.4*  --   PLT 51*  --   --  45* 53*  --   LABPROT 34.9*  --   --   --   --  34.5*  INR 3.57*  --   --   --   --  3.52*  CREATININE  --  1.20 1.06  --  1.18  --     Estimated Creatinine Clearance: 18.1 mL/min (by C-G formula based on Cr of 1.18).   Medical History: Past Medical History  Diagnosis Date  . ANEMIA, PERNICIOUS 05/14/2007  . Atrial fibrillation 08/08/2007  . BENIGN PROSTATIC HYPERTROPHY 03/05/2007  . CORONARY ARTERY DISEASE 03/05/2007  . DYSPLASTIC NEVUS 01/24/2008  . HYPERLIPIDEMIA 03/05/2007  . HYPERTENSION 03/05/2007  . MYOCARDIAL INFARCTION, HX OF 03/05/2007    1982  . NEUTROPENIA NOS 05/11/2007  . Rheumatoid arthritis(714.0) 03/05/2007  . VITAMIN B12 DEFICIENCY 08/08/2007  . Current use of long term anticoagulation   . Rheumatoid arthritis(714.0)   . Hernia     umbilical    Assessment: 81 yo male with hx of a fib on warfarin PTA at a dose of 6 mg on Thur/Sun and 4 mg all other days.   Current INR is 3.52  Goal of Therapy:  INR 2-3 Monitor platelets by anticoagulation protocol: Yes   Plan:  Hold warfarin until INR < 3 Daily INR Monitor for s/sx of bleeding  Levester Fresh, PharmD, BCPS Clinical Pharmacist Pager [phone] 03/23/2015 9:05 PM

## 2015-03-23 NOTE — Progress Notes (Signed)
Patient transferred to 2 C bed 2.  Wife at bedside.  Report called to Lester Kinsman, RN.

## 2015-03-23 NOTE — Progress Notes (Signed)
[facility] TEAM 1 - Stepdown/ICU TEAM Progress Note  Kevin Rubio CBS:[id number] DOB: [birth year] DOA: 03/21/2015 PCP: Nyoka Cowden, MD  Admit HPI / Brief Narrative: 81 y/o man with high functional status but history of longstanding leukopenia, thrombocytopenia (marrow bx in 2010 unrevealing), atrial fibrillation, CAD with remote hx of MI, as well RA who presented to the ED with a 1-2 week history of progressive weakness, chills, and urinary frequncey. On presentation, he was found to be frebrile. He has had poor PO intake, but had been complaint with his medication regimen (including warfarin). He presented to the ED as his weakness progressed to the point when he was unable to get out of bed. At baseline, he is fully independent for ADLs, and still drives short distances per his PCP note. He also reported some scrotal swelling and pain.  HPI/Subjective: The patient complains of ongoing pain in the scrotum.  He denies chest pain shortness breath fevers chills or abdominal pain.  Assessment/Plan:  Severe Sepsis due to UTI / orchitis / epididymitis - neutropenic fever Hemodynamically stabilizing - continue empiric antibiotic therapy as patient appears to be responding clinically  Hypotension Improving w/ volume expansion   Acute kidney injury  Due to above - continue to volume resuscitate and follow  R groin pain / R inguinal hernia Majority of pain likely related to orchitis/epididymitis - continue empiric antibiotic therapy and follow  Chronic Afib Rate controlled - continue chronic anticoagulation  CAD Quiescent  RA  Quiescent  Chronic Anemia / Leukopenia / thrombocytopenia  All counts appear to be stable at this time   Code Status: FULL Family Communication: Spoke with patient's wife and daughter at bedside at length Disposition Plan: SDU  Consultants: Gen Surgery  PCCM  Procedures: none  Antibiotics: Vanc 7/30 > Zosyn 7/30 >  DVT  prophylaxis: warfarin  Objective: Blood pressure 102/47, pulse 64, temperature 97.7 F (36.5 C), temperature source Oral, resp. rate 24, height 5\' 7"  (1.702 m), weight 31.434 kg (69 lb 4.8 oz), SpO2 98 %.  Intake/Output Summary (Last 24 hours) at 03/23/15 1348 Last data filed at 03/23/15 1321  Gross per 24 hour  Intake    640 ml  Output    870 ml  Net   -230 ml   Exam: General: No acute respiratory distress Lungs: Clear to auscultation bilaterally without wheezes or crackles Cardiovascular: Regular rate and rhythm without murmur gallop or rub normal S1 and S2 Abdomen: Nontender, nondistended, soft, bowel sounds positive, no rebound, no ascites, no appreciable mass GU:  Right side of the scrotum erythematous, mildly edematous, and tender to touch Extremities: No significant cyanosis, clubbing, or edema bilateral lower extremities  Data Reviewed: Basic Metabolic Panel:  Recent Labs Lab 03/21/15 1642 03/21/15 2256 03/22/15 0509 03/23/15 0209  NA 135 134*  --  134*  K 4.4 3.7  --  3.8  CL 104 109  --  109  CO2  --  22  --  22  GLUCOSE 102* 106*  --  107*  BUN 17 12  --  18  CREATININE 1.20 1.06  --  1.18  CALCIUM  --  7.1*  --  7.7*  MG  --  1.8 1.9 1.8  PHOS  --  2.8 2.8 2.6    CBC:  Recent Labs Lab 03/21/15 1635 03/21/15 1642 03/22/15 0509 03/23/15 0209  WBC 2.0*  --  1.1* 1.2*  NEUTROABS 1.4*  --   --   --   HGB 12.8* 12.9* 12.2*  11.3*  HCT 37.2* 38.0* 36.4* 33.4*  MCV 89.2  --  90.3 88.8  PLT 51*  --  45* 53*   Liver Function Tests:  Recent Labs Lab 03/21/15 2256  AST 45*  ALT 19  ALKPHOS 91  BILITOT 0.9  PROT 6.4*  ALBUMIN 1.8*    Recent Labs Lab 03/21/15 2256  LIPASE 33   Coags:  Recent Labs Lab 03/21/15 1635  INR 3.57*   CBG:  Recent Labs Lab 03/22/15 0802 03/22/15 1213 03/22/15 1556 03/23/15 0816 03/23/15 1204  GLUCAP 85 111* 102* 83 99    Recent Results (from the past 240 hour(s))  Urine culture     Status: None  (Preliminary result)   Collection Time: 03/21/15  5:00 PM  Result Value Ref Range Status   Specimen Description URINE, CLEAN CATCH  Final   Special Requests NONE  Final   Culture CULTURE REINCUBATED FOR BETTER GROWTH  Final   Report Status PENDING  Incomplete  Blood Culture (routine x 2)     Status: None (Preliminary result)   Collection Time: 03/21/15  6:32 PM  Result Value Ref Range Status   Specimen Description BLOOD RIGHT FOREARM  Final   Special Requests BOTTLES DRAWN AEROBIC AND ANAEROBIC 5ML  Final   Culture NO GROWTH < 24 HOURS  Final   Report Status PENDING  Incomplete  Blood Culture (routine x 2)     Status: None (Preliminary result)   Collection Time: 03/21/15  7:22 PM  Result Value Ref Range Status   Specimen Description BLOOD RIGHT HAND  Final   Special Requests   Final    BOTTLES DRAWN AEROBIC ONLY 5CC PT ON ZOSYN,VANCOMYCIN   Culture NO GROWTH < 24 HOURS  Final   Report Status PENDING  Incomplete  MRSA PCR Screening     Status: None   Collection Time: 03/22/15 12:03 AM  Result Value Ref Range Status   MRSA by PCR NEGATIVE NEGATIVE Final    Comment:        The GeneXpert MRSA Assay (FDA approved for NASAL specimens only), is one component of a comprehensive MRSA colonization surveillance program. It is not intended to diagnose MRSA infection nor to guide or monitor treatment for MRSA infections.      Studies:   Recent x-ray studies have been reviewed in detail by the Attending Physician  Scheduled Meds:  Scheduled Meds: . insulin aspart  0-5 Units Subcutaneous QHS  . insulin aspart  0-9 Units Subcutaneous TID WC  . levothyroxine  25 mcg Oral QAC breakfast  . metoprolol tartrate  50 mg Oral Daily  . metoprolol tartrate  25 mg Oral QHS  . piperacillin-tazobactam (ZOSYN)  IV  3.375 g Intravenous Q8H  . pravastatin  40 mg Oral Daily  . vancomycin  500 mg Intravenous Q12H    Time spent on care of this patient: 35 mins   MCCLUNG,JEFFREY T , MD    [facility] Office  [phone] Pager - Text Page per Shea Evans as per below:  On-Call/Text Page:      [email or web address]      password [email or web address]  If 7PM-7AM, please contact night-coverage [email or web address] Password [email or web address] 03/23/2015, 1:48 PM   LOS: 2 days

## 2015-03-23 NOTE — Progress Notes (Signed)
Patient transferred from 35M via wheelchair to 2C02. Patient assisted to bed without difficulty. Wife at bedside. No concerns voiced at this time.

## 2015-03-24 DIAGNOSIS — N390 Urinary tract infection, site not specified: Secondary | ICD-10-CM

## 2015-03-24 DIAGNOSIS — N451 Epididymitis: Secondary | ICD-10-CM | POA: Diagnosis present

## 2015-03-24 DIAGNOSIS — I959 Hypotension, unspecified: Secondary | ICD-10-CM | POA: Diagnosis present

## 2015-03-24 DIAGNOSIS — M069 Rheumatoid arthritis, unspecified: Secondary | ICD-10-CM

## 2015-03-24 DIAGNOSIS — I482 Chronic atrial fibrillation, unspecified: Secondary | ICD-10-CM | POA: Diagnosis present

## 2015-03-24 DIAGNOSIS — I2510 Atherosclerotic heart disease of native coronary artery without angina pectoris: Secondary | ICD-10-CM

## 2015-03-24 DIAGNOSIS — D72819 Decreased white blood cell count, unspecified: Secondary | ICD-10-CM

## 2015-03-24 DIAGNOSIS — D696 Thrombocytopenia, unspecified: Secondary | ICD-10-CM | POA: Diagnosis present

## 2015-03-24 DIAGNOSIS — N179 Acute kidney failure, unspecified: Secondary | ICD-10-CM | POA: Diagnosis present

## 2015-03-24 DIAGNOSIS — N452 Orchitis: Secondary | ICD-10-CM | POA: Diagnosis present

## 2015-03-24 DIAGNOSIS — A419 Sepsis, unspecified organism: Secondary | ICD-10-CM | POA: Diagnosis present

## 2015-03-24 DIAGNOSIS — D649 Anemia, unspecified: Secondary | ICD-10-CM

## 2015-03-24 LAB — CBC
HCT: 32.7 % — ABNORMAL LOW (ref 39.0–52.0)
Hemoglobin: 11.3 g/dL — ABNORMAL LOW (ref 13.0–17.0)
MCH: 30.5 pg (ref 26.0–34.0)
MCHC: 34.6 g/dL (ref 30.0–36.0)
MCV: 88.4 fL (ref 78.0–100.0)
Platelets: 52 10*3/uL — ABNORMAL LOW (ref 150–400)
RBC: 3.7 MIL/uL — ABNORMAL LOW (ref 4.22–5.81)
RDW: 14.9 % (ref 11.5–15.5)
WBC: 1.1 10*3/uL — CL (ref 4.0–10.5)

## 2015-03-24 LAB — COMPREHENSIVE METABOLIC PANEL
ALBUMIN: 1.9 g/dL — AB (ref 3.5–5.0)
ALK PHOS: 85 U/L (ref 38–126)
ALT: 16 U/L — ABNORMAL LOW (ref 17–63)
AST: 32 U/L (ref 15–41)
Anion gap: 6 (ref 5–15)
BUN: 16 mg/dL (ref 6–20)
CO2: 22 mmol/L (ref 22–32)
Calcium: 7.7 mg/dL — ABNORMAL LOW (ref 8.9–10.3)
Chloride: 105 mmol/L (ref 101–111)
Creatinine, Ser: 1.02 mg/dL (ref 0.61–1.24)
GFR calc Af Amer: >60 mL/min (ref >60)
GFR calc non Af Amer: >60 mL/min (ref >60)
GLUCOSE: 85 mg/dL (ref 65–99)
POTASSIUM: 4 mmol/L (ref 3.5–5.1)
Sodium: 133 mmol/L — ABNORMAL LOW (ref 135–145)
Total Bilirubin: 0.8 mg/dL (ref 0.3–1.2)
Total Protein: 7.1 g/dL (ref 6.5–8.1)

## 2015-03-24 LAB — GLUCOSE, CAPILLARY
GLUCOSE-CAPILLARY: 80 mg/dL (ref 65–99)
GLUCOSE-CAPILLARY: 82 mg/dL (ref 65–99)
GLUCOSE-CAPILLARY: 108 mg/dL — AB (ref 65–99)
Glucose-Capillary: 85 mg/dL (ref 65–99)

## 2015-03-24 LAB — PROTIME-INR
INR: 3.13 — AB (ref 0.00–1.49)
PROTHROMBIN TIME: 31.6 s — AB (ref 11.6–15.2)

## 2015-03-24 MED ORDER — SODIUM CHLORIDE 0.9 % IV SOLN: 250.0000 mL | INTRAVENOUS | Status: DC

## 2015-03-24 MED ORDER — WARFARIN - PHARMACIST DOSING INPATIENT: Freq: Every day | Status: DC

## 2015-03-24 MED ORDER — LEVOFLOXACIN 500 MG PO TABS
500.0000 mg | ORAL_TABLET | ORAL | Status: DC
  Administered 2015-03-24: 500 mg via ORAL
  Filled 2015-03-24: qty 1

## 2015-03-24 MED ORDER — SODIUM CHLORIDE 0.9 % IV SOLN
INTRAVENOUS | Status: DC
  Administered 2015-03-24: 13:00:00 via INTRAVENOUS

## 2015-03-24 MED ORDER — SODIUM CHLORIDE 0.9 % IV SOLN: 250.0000 mL | INTRAVENOUS | Status: DC | PRN

## 2015-03-24 MED ORDER — WARFARIN 0.5 MG HALF TABLET
0.5000 mg | ORAL_TABLET | Freq: Once | ORAL | Status: AC
  Administered 2015-03-24: 0.5 mg via ORAL
  Filled 2015-03-24: qty 1

## 2015-03-24 NOTE — Care Management Important Message (Signed)
Important Message  Patient Details  Name: Kevin Rubio MRN: [id number] Date of Birth: 1922/04/12   Medicare Important Message Given:  Yes-second notification given    Pricilla Handler 03/24/2015, 11:44 AM

## 2015-03-24 NOTE — Progress Notes (Signed)
Kevin Rubio - Stepdown/ICU TEAM Progress Note  Kevin Rubio RKY:[id number] DOB: 1922/07/28 DOA: 03/21/2015 PCP: Nyoka Cowden, MD  Admit HPI / Brief Narrative: 81 y/o WM PMHx high functional status but history of longstanding leukopenia, thrombocytopenia (marrow bx in 2010 unrevealing), atrial fibrillation, CAD with remote hx of MI, as well RA   Presented to the ED with a Rubio-2 week history of progressive weakness, chills, and urinary frequncey. On presentation, he was found to be frebrile. He has had poor PO intake, but had been complaint with his medication regimen (including warfarin). He presented to the ED as his weakness progressed to the point when he was unable to get out of bed. At baseline, he is fully independent for ADLs, and still drives short distances per his PCP note. He also reported some scrotal swelling and pain.   HPI/Subjective: 8/2 A/O 4, patient states penile swelling has decreased however scrotal swelling still very tender and feels may be slightly larger.  Assessment/Plan: Severe Sepsis due to Staphylococcus MSSA UTI / orchitis / epididymitis - neutropenic fever -Hemodynamically stabilizing  -Spoke with Dr. Flossie Dibble (ID) and agrees 10 days of levofloxacin would be appropriate. -Patient will need to remain in hospital 24-48 hrs. While we assured tolerates PO antibiotics, evaluate effect on his INR, and ensure scrotal swelling/pain decreasing.  Hypotension -Patient still hypotensive age, continue normal saline 19ml/hr   Acute kidney injury  -Resolved with fluid resuscitation   R groin pain / R inguinal hernia -Penile pain resolved, however continued scrotal swelling with pain to palpation Lt>>Rt. -Continue levofloxacin for full ten-day course   Chronic Afib -Rate controlled, currently in NSR - continue chronic anticoagulation per pharmacy -8/2 INR= 3.13  CAD in native artery Quiescent  RA  Quiescent  Chronic Anemia / Leukopenia /  thrombocytopenia  -All counts appear to be stable at this time    Code Status: FULL Family Communication: Wife and daughter-in-law  present at time of exam Disposition Plan: DC 24-48 hours if tolerates by mouth medication.    Consultants: Phone consult with Dr. Dietrich Pates dam (ID)   Procedure/Significant Events: 7/30 CT abdomen pelvis with contrast;-Marked splenomegaly with a craniocaudal length of 20 cm. -Small Rt inguinal hernia containing fat and fluid;Small amount  free fluid in the cul-de-sac of the pelvis. -Rt dependent basilar atelectasis or infiltrate. 7/31 ultrasound scrotum-Enlarged heterogeneous right epididymis with increased vascularity to the right epididymis and testicle.  -Moderate size mildly complex right hydrocele versus pyocele. Most typical of infection/epididymal orchitis. -Small left hydrocele.-Small bilateral varicoceles.    Culture 7/30 urine positive staph MSSA 7/30 blood right forearm/hand NGTD 7/31 MRSA by PCR negative   Antibiotics: Vanc 7/30 >> stopped 8/Rubio Zosyn 7/30 >> stopped 8/Rubio Levofloxacin 8/2>>   DVT prophylaxis: Warfarin   Devices    LINES / TUBES:      Continuous Infusions: . sodium chloride 75 mL/hr at 03/24/15 1257    Objective: VITAL SIGNS: Temp: 97.2 F (36.2 C) (08/02 1217) Temp Source: Oral (08/02 1217) BP: 108/53 mmHg (08/02 1217) Pulse Rate: 79 (08/02 1217) SPO2; FIO2:   Intake/Output Summary (Last 24 hours) at 03/24/15 1302 Last data filed at 03/24/15 1230  Gross per 24 hour  Intake     60 ml  Output   1150 ml  Net  -1090 ml     Exam: General: A/O 4, moderate discomfort secondary to scrotal swelling, No acute respiratory distress Eyes: Negative headache, eye pain, negative scleral hemorrhage ENT: Negative Runny nose, negative  ear pain, negative tinnitus, negative gingival bleeding Neck:  Negative scars, masses, torticollis, lymphadenopathy, JVD Lungs: Clear to auscultation bilaterally without  wheezes or crackles Cardiovascular: Regular rate and rhythm without murmur gallop or rub normal S1 and S2 Abdomen:negative abdominal pain, negative dysphagia, Nontender, nondistended, soft, bowel sounds positive, no rebound, no ascites, no appreciable mass; significant swelling scrotum Rt>> Lt, mild erythema, negative warmth to touch, painful to palpation Extremities: No significant cyanosis, clubbing, or edema bilateral lower extremities Psychiatric:  Negative depression, negative anxiety, negative fatigue, negative mania  Neurologic:  Cranial nerves II through XII intact, tongue/uvula midline, all extremities muscle strength 5/5, sensation intact throughout, negative dysarthria, negative expressive aphasia, negative receptive aphasia.     Data Reviewed: Basic Metabolic Panel:  Recent Labs Lab 03/21/15 1642 03/21/15 2256 03/22/15 0509 03/23/15 0209 03/24/15 0230  NA 135 134*  --  134* 133*  K 4.4 3.7  --  3.8 4.0  CL 104 109  --  109 105  CO2  --  22  --  22 22  GLUCOSE 102* 106*  --  107* 85  BUN 17 12  --  18 16  CREATININE Rubio.20 Rubio.06  --  Rubio.18 Rubio.02  CALCIUM  --  7.Rubio*  --  7.7* 7.7*  MG  --  Rubio.8 Rubio.9 Rubio.8  --   PHOS  --  2.8 2.8 2.6  --    Liver Function Tests:  Recent Labs Lab 03/21/15 2256 03/24/15 0230  AST 45* 32  ALT 19 16*  ALKPHOS 91 85  BILITOT 0.9 0.8  PROT 6.4* 7.Rubio  ALBUMIN Rubio.8* Rubio.9*    Recent Labs Lab 03/21/15 2256  LIPASE 33   No results for input(s): AMMONIA in the last 168 hours. CBC:  Recent Labs Lab 03/21/15 1635 03/21/15 1642 03/22/15 0509 03/23/15 0209 03/24/15 0230  WBC 2.0*  --  Rubio.Rubio* Rubio.2* Rubio.Rubio*  NEUTROABS Rubio.4*  --   --   --   --   HGB 12.8* 12.9* 12.2* 11.3* 11.3*  HCT 37.2* 38.0* 36.4* 33.4* 32.7*  MCV 89.2  --  90.3 88.8 88.4  PLT 51*  --  45* 53* 52*   Cardiac Enzymes: No results for input(s): CKTOTAL, CKMB, CKMBINDEX, TROPONINI in the last 168 hours. BNP (last 3 results)  Recent Labs  09/17/14 1900 03/21/15 1635  BNP  302.Rubio* 272.4*    ProBNP (last 3 results) No results for input(s): PROBNP in the last 8760 hours.  CBG:  Recent Labs Lab 03/23/15 1204 03/23/15 1718 03/23/15 2113 03/24/15 0744 03/24/15 1212  GLUCAP 99 80 115* 80 85    Recent Results (from the past 240 hour(s))  Urine culture     Status: None (Preliminary result)   Collection Time: 03/21/15  5:00 PM  Result Value Ref Range Status   Specimen Description URINE, CLEAN CATCH  Final   Special Requests NONE  Final   Culture   Final    >=100,000 COLONIES/mL STAPHYLOCOCCUS SPECIES (COAGULASE NEGATIVE) 30,000 COLONIES/mL STAPHYLOCOCCUS AUREUS    Report Status PENDING  Incomplete   Organism ID, Bacteria STAPHYLOCOCCUS AUREUS  Final      Susceptibility   Staphylococcus aureus - MIC*    CIPROFLOXACIN <=0.5 SENSITIVE Sensitive     GENTAMICIN <=0.5 SENSITIVE Sensitive     NITROFURANTOIN <=16 SENSITIVE Sensitive     OXACILLIN <=0.25 SENSITIVE Sensitive     TETRACYCLINE <=Rubio SENSITIVE Sensitive     VANCOMYCIN Rubio SENSITIVE Sensitive     TRIMETH/SULFA <=10 SENSITIVE Sensitive  CLINDAMYCIN <=0.25 SENSITIVE Sensitive     RIFAMPIN <=0.5 SENSITIVE Sensitive     Inducible Clindamycin NEGATIVE Sensitive     * 30,000 COLONIES/mL STAPHYLOCOCCUS AUREUS  Blood Culture (routine x 2)     Status: None (Preliminary result)   Collection Time: 03/21/15  6:32 PM  Result Value Ref Range Status   Specimen Description BLOOD RIGHT FOREARM  Final   Special Requests BOTTLES DRAWN AEROBIC AND ANAEROBIC 5ML  Final   Culture NO GROWTH 2 DAYS  Final   Report Status PENDING  Incomplete  Blood Culture (routine x 2)     Status: None (Preliminary result)   Collection Time: 03/21/15  7:22 PM  Result Value Ref Range Status   Specimen Description BLOOD RIGHT HAND  Final   Special Requests   Final    BOTTLES DRAWN AEROBIC ONLY 5CC PT ON ZOSYN,VANCOMYCIN   Culture NO GROWTH 2 DAYS  Final   Report Status PENDING  Incomplete  MRSA PCR Screening     Status: None    Collection Time: 03/22/15 12:03 AM  Result Value Ref Range Status   MRSA by PCR NEGATIVE NEGATIVE Final    Comment:        The GeneXpert MRSA Assay (FDA approved for NASAL specimens only), is one component of a comprehensive MRSA colonization surveillance program. It is not intended to diagnose MRSA infection nor to guide or monitor treatment for MRSA infections.      Studies:  Recent x-ray studies have been reviewed in detail by the Attending Physician  Scheduled Meds:  Scheduled Meds: . insulin aspart  0-5 Units Subcutaneous QHS  . insulin aspart  0-9 Units Subcutaneous TID WC  . levofloxacin  500 mg Oral Q48H  . levothyroxine  25 mcg Oral QAC breakfast  . metoprolol tartrate  50 mg Oral Daily  . metoprolol tartrate  25 mg Oral QHS  . pravastatin  40 mg Oral Daily  . warfarin  0.5 mg Oral ONCE-1800  . Warfarin - Pharmacist Dosing Inpatient   Does not apply q1800    Time spent on care of this patient: 40 mins   Jadah Bobak, Geraldo Docker , MD  [facility] Office  [phone] Pager - [phone]  On-Call/Text Page:      [email or web address]      password [email or web address]  If 7PM-7AM, please contact night-coverage [email or web address] Password [email or web address] 03/24/2015, Rubio:02 PM   LOS: 3 days   Care during the described time interval was provided by me .  I have reviewed this patient's available data, including medical history, events of note, physical examination, and all test results as part of my evaluation. I have personally reviewed and interpreted all radiology studies.   Dia Crawford, MD [phone] Pager

## 2015-03-24 NOTE — Progress Notes (Signed)
Report called to receiving nurse. Patient being transferred to 6N13. Family at bedside and made aware of transfer.

## 2015-03-24 NOTE — Progress Notes (Signed)
[place] for Warfarin Indication: atrial fibrillation  Allergies  Allergen Reactions  . Cefpodoxime Proxetil Other (See Comments)    unknown   Labs:  Recent Labs  03/21/15 1635  03/21/15 2256 03/22/15 0509 03/23/15 0209 03/23/15 1955 03/24/15 0230  HGB 12.8*  < >  --  12.2* 11.3*  --  11.3*  HCT 37.2*  < >  --  36.4* 33.4*  --  32.7*  PLT 51*  --   --  45* 53*  --  52*  LABPROT 34.9*  --   --   --   --  34.5* 31.6*  INR 3.57*  --   --   --   --  3.52* 3.13*  CREATININE  --   < > 1.06  --  1.18  --  1.02  < > = values in this interval not displayed.  Estimated Creatinine Clearance: 44.1 mL/min (by C-G formula based on Cr of 1.02).  Assessment: 81 yo male with hx of a fib on warfarin PTA at a dose of 6 mg on Thur/Sun and 4 mg all other days.   Current INR is 3.13  Goal of Therapy:  INR 2-3 Monitor platelets by anticoagulation protocol: Yes   Plan:  Small dose of Coumadin today  - 0.5 mg po x 1 Continue to follow daily INR  Thank you Anette Guarneri, PharmD [phone] 03/24/2015 9:59 AM

## 2015-03-24 NOTE — Progress Notes (Signed)
Attempt to call report. Nurse not able to take report at this time.

## 2015-03-25 ENCOUNTER — Other Ambulatory Visit: Payer: Self-pay

## 2015-03-25 DIAGNOSIS — N390 Urinary tract infection, site not specified: Secondary | ICD-10-CM

## 2015-03-25 DIAGNOSIS — I482 Chronic atrial fibrillation: Secondary | ICD-10-CM

## 2015-03-25 DIAGNOSIS — N452 Orchitis: Secondary | ICD-10-CM

## 2015-03-25 DIAGNOSIS — A419 Sepsis, unspecified organism: Secondary | ICD-10-CM

## 2015-03-25 LAB — PROTIME-INR
INR: 2.23 — AB (ref 0.00–1.49)
PROTHROMBIN TIME: 24.5 s — AB (ref 11.6–15.2)

## 2015-03-25 LAB — GLUCOSE, CAPILLARY
GLUCOSE-CAPILLARY: 89 mg/dL (ref 65–99)
GLUCOSE-CAPILLARY: 92 mg/dL (ref 65–99)
Glucose-Capillary: 82 mg/dL (ref 65–99)
Glucose-Capillary: 115 mg/dL — ABNORMAL HIGH (ref 65–99)

## 2015-03-25 LAB — URINE CULTURE: Culture: >=100000

## 2015-03-25 MED ORDER — METOPROLOL TARTRATE 1 MG/ML IV SOLN
2.5000 mg | Freq: Once | INTRAVENOUS | Status: AC
  Administered 2015-03-25: 2.5 mg via INTRAVENOUS
  Filled 2015-03-25: qty 5

## 2015-03-25 MED ORDER — SODIUM CHLORIDE 0.9 % IV BOLUS (SEPSIS)
500.0000 mL | Freq: Once | INTRAVENOUS | Status: AC
  Administered 2015-03-25: 500 mL via INTRAVENOUS

## 2015-03-25 MED ORDER — WARFARIN SODIUM 3 MG PO TABS
3.0000 mg | ORAL_TABLET | Freq: Once | ORAL | Status: AC
  Administered 2015-03-25: 3 mg via ORAL
  Filled 2015-03-25: qty 1

## 2015-03-25 MED ORDER — METOPROLOL TARTRATE 50 MG PO TABS
50.0000 mg | ORAL_TABLET | Freq: Two times a day (BID) | ORAL | Status: DC
  Administered 2015-03-25: 50 mg via ORAL
  Filled 2015-03-25 (×3): qty 1

## 2015-03-25 NOTE — Progress Notes (Signed)
Pt converted from SR to Afib RVR, HR in the 140s. Raliegh Ip Schorr paged. Received orders for EKG, NS bolus 55ml and lopressor 2.5mg  IV once.   Last set of VS  .BP 95/74 mmHg  Pulse 68  Temp(Src) 97.4 F (36.3 C) (Oral)  Resp 26  Ht 5\' 8"  (1.727 m)  Wt 70.534 kg (155 lb 8 oz)  BMI 23.65 kg/m2  SpO2 97%

## 2015-03-25 NOTE — Evaluation (Signed)
Physical Therapy Evaluation Patient Details Name: Kevin Rubio MRN: [id number] DOB: Jun 03, 1922 Today's Date: 03/25/2015   History of Present Illness  Presented to the ED with a 1-2 week history of progressive weakness, chills, and urinary frequncey. On presentation, he was found to be frebrile. He has had poor PO intake, but had been complaint with his medication regimen (including warfarin). He presented to the ED as his weakness progressed to the point when he was unable to get out of bed. At baseline, he is fully independent for ADLs, and still drives short distances per his PCP note. He also reported some scrotal swelling and pain.  Clinical Impression  Pt presents with above and generalized weakness with decreased balance.  Pt able to ambulate full length of unit with RW at min/guard to close S level.  Discussed with pt and wife using RW at home initially and participating in [place] to progress to ambulation without AD.  Pt reluctant and states he would decide later, therefore recommend he use RW at all times at home initially and maintain high level of activity. Will continue to see acutely to address deficits.     Follow Up Recommendations Home health PT;Supervision for mobility/OOB    Equipment Recommendations  None recommended by PT    Recommendations for Other Services       Precautions / Restrictions Precautions Precautions: Fall Restrictions Weight Bearing Restrictions: No      Mobility  Bed Mobility Overal bed mobility: Needs Assistance Bed Mobility: Supine to Sit     Supine to sit: Min assist     General bed mobility comments: Pt requires increased time to complete task as well as min A to elevate trunk.  Mod cues for hand placement and safety.   Transfers Overall transfer level: Needs assistance Equipment used: Rolling walker (2 wheeled) Transfers: Sit to/from Stand Sit to Stand: Min guard         General transfer comment: Min/guard for forward weight shift  and cues for hand placement  Ambulation/Gait Ambulation/Gait assistance: Supervision;Min guard Ambulation Distance (Feet): 400 Feet Assistive device: Rolling walker (2 wheeled) Gait Pattern/deviations: Step-through pattern;Decreased stride length;Trunk flexed     General Gait Details: Pt min/guard fading to close S with use of RW with cues for posture and safety when turning with RW.    Stairs            Wheelchair Mobility    Modified Rankin (Stroke Patients Only)       Balance Overall balance assessment: Needs assistance Sitting-balance support: Feet supported Sitting balance-Leahy Scale: Fair     Standing balance support: During functional activity;Bilateral upper extremity supported Standing balance-Leahy Scale: Poor Standing balance comment: Requires use of RW for stability.                              Pertinent Vitals/Pain Pain Assessment: No/denies pain    Home Living Family/patient expects to be discharged to:: Private residence Living Arrangements: Spouse/significant other Available Help at Discharge: Family;Available 24 hours/day Type of Home: House Home Access: Stairs to enter Entrance Stairs-Rails: None Entrance Stairs-Number of Steps: 1 Home Layout: One level (one step to get into living room, no rail) Home Equipment: Shower seat - built in      Prior Function Level of Independence: Independent with assistive device(s)         Comments: states using RW every now and then, but states "I should use it more  than I should"     Hand Dominance        Extremity/Trunk Assessment               Lower Extremity Assessment: Generalized weakness      Cervical / Trunk Assessment: Kyphotic  Communication   Communication: HOH  Cognition Arousal/Alertness: Awake/alert Behavior During Therapy: WFL for tasks assessed/performed Overall Cognitive Status: Within Functional Limits for tasks assessed                       General Comments      Exercises        Assessment/Plan    PT Assessment Patient needs continued PT services  PT Diagnosis Generalized weakness   PT Problem List Decreased strength;Decreased activity tolerance;Decreased balance;Decreased mobility;Decreased knowledge of precautions  PT Treatment Interventions DME instruction;Gait training;Stair training;Functional mobility training;Therapeutic activities;Therapeutic exercise;Balance training;Patient/family education   PT Goals (Current goals can be found in the Care Plan section) Acute Rehab PT Goals Patient Stated Goal: to return home PT Goal Formulation: With patient/family Time For Goal Achievement: 04/01/15 Potential to Achieve Goals: Good    Frequency Min 3X/week   Barriers to discharge        Co-evaluation               End of Session Equipment Utilized During Treatment: Gait belt Activity Tolerance: Patient tolerated treatment well Patient left: in chair;with call bell/phone within reach;with family/visitor present Nurse Communication: Mobility status         Time: 7253-6644 PT Time Calculation (min) (ACUTE ONLY): 27 min   Charges:   PT Evaluation $Initial PT Evaluation Tier I: 1 Procedure PT Treatments $Gait Training: 8-22 mins   PT G CodesDenice Bors 03/25/2015, 3:56 PM

## 2015-03-25 NOTE — Progress Notes (Addendum)
[place] TEAM 1 transfer  Kevin Rubio XBL:[id number] DOB: 28-Feb-1922 DOA: 03/21/2015 PCP: Nyoka Cowden, MD  Admit HPI / Brief Narrative: 81 y/o WM PMHx high functional status but history of longstanding leukopenia, thrombocytopenia (marrow bx in 2010 unrevealing), atrial fibrillation, CAD with remote hx of MI, as well RA   Presented to the ED with a 1-2 week history of progressive weakness, chills, and urinary frequncey. On presentation, he was found to be frebrile. He has had poor PO intake, but had been complaint with his medication regimen (including warfarin). He presented to the ED as his weakness progressed to the point when he was unable to get out of bed. At baseline, he is fully independent for ADLs, and still drives short distances per his PCP note. He also reported some scrotal swelling and pain.   HPI/Subjective: Starting to feel better, scrotal swelling improved  and less painful   Assessment/Plan: Severe Sepsis due to Staphylococcus MSSA UTI / orchitis / epididymitis - neutropenic fever -Dr. Ellwood Sayers d/w Dr. Flossie Dibble (ID) and agrees 10 days of levofloxacin would be appropriate, completed 5 days of Abx so far -continues to improve clinically -stop IVF  Hypotension -improved  Acute kidney injury  -Resolved with fluid resuscitation   Orchitis /groin pain / R inguinal hernia -continued scrotal swelling with pain to palpation Lt>>Rt. -improving -Continue levofloxacin for full ten-day course  -FU with Urology, seen by [facility] Dr.Ramirez this admission, according to him patient has small R inguinal hernia with no incarceration, self reducible.  Chronic Afib -Rate controlled, currently in NSR - continue chronic anticoagulation per pharmacy  CAD in native artery -stable  RA  -Quiescent-  Chronic Anemia / Leukopenia / thrombocytopenia  -All counts appear to be stable at this time,  -History of bone marrow aspiration cytology in 06/2009 which showed  hypercellular trilineage hematopoiesis -Follow-up with hematology  Code Status: FULL Family Communication: Wife and daughter-in-law  present at time of exam Disposition Plan: DC 24-48 hours if tolerates by mouth medication.    Consultants: Phone consult with Dr. Dietrich Pates dam (ID)   Procedure/Significant Events: 7/30 CT abdomen pelvis with contrast;-Marked splenomegaly with a craniocaudal length of 20 cm. -Small Rt inguinal hernia containing fat and fluid;Small amount  free fluid in the cul-de-sac of the pelvis. -Rt dependent basilar atelectasis or infiltrate. 7/31 ultrasound scrotum-Enlarged heterogeneous right epididymis with increased vascularity to the right epididymis and testicle.  -Moderate size mildly complex right hydrocele versus pyocele. Most typical of infection/epididymal orchitis. -Small left hydrocele.-Small bilateral varicoceles.    Culture 7/30 urine positive staph MSSA 7/30 blood right forearm/hand NGTD 7/31 MRSA by PCR negative   Antibiotics: Vanc 7/30 >> stopped 8/1 Zosyn 7/30 >> stopped 8/1 Levofloxacin 8/2>>   DVT prophylaxis: Warfarin   Objective: isVITAL SIGNS: Temp: 97.4 F (36.3 C) (08/03 0514) Temp Source: Oral (08/03 0514) BP: 95/74 mmHg (08/03 0514) Pulse Rate: 68 (08/03 0514) SPO2; FIO2:   Intake/Output Summary (Last 24 hours) at 03/25/15 1335 Last data filed at 03/25/15 1113  Gross per 24 hour  Intake    975 ml  Output   1675 ml  Net   -700 ml     Exam: General: A/O 4, moderate discomfort secondary to scrotal swelling, No acute respiratory distress Eyes: Negative headache, eye pain, negative scleral hemorrhage ENT: Negative Runny nose, negative ear pain, negative tinnitus, negative gingival bleeding Neck:  Negative scars, masses, torticollis, lymphadenopathy, JVD Lungs: Clear to auscultation bilaterally without wheezes or crackles Cardiovascular: Regular rate and  rhythm without murmur gallop or rub normal S1 and  S2 Abdomen:negative abdominal pain, negative dysphagia, Nontender, nondistended, soft, bowel sounds positive, no rebound, no ascites, no appreciable mass; significant swelling scrotum Rt>> Lt, mild erythema, negative warmth to touch, painful to palpation Extremities: No significant cyanosis, clubbing, or edema bilateral lower extremities Psychiatric:  Negative depression, negative anxiety, negative fatigue, negative mania  Neurologic:  Cranial nerves II through XII intact, tongue/uvula midline, all extremities muscle strength 5/5, sensation intact throughout, negative dysarthria, negative expressive aphasia, negative receptive aphasia.     Data Reviewed: Basic Metabolic Panel:  Recent Labs Lab 03/21/15 1642 03/21/15 2256 03/22/15 0509 03/23/15 0209 03/24/15 0230  NA 135 134*  --  134* 133*  K 4.4 3.7  --  3.8 4.0  CL 104 109  --  109 105  CO2  --  22  --  22 22  GLUCOSE 102* 106*  --  107* 85  BUN 17 12  --  18 16  CREATININE 1.20 1.06  --  1.18 1.02  CALCIUM  --  7.1*  --  7.7* 7.7*  MG  --  1.8 1.9 1.8  --   PHOS  --  2.8 2.8 2.6  --    Liver Function Tests:  Recent Labs Lab 03/21/15 2256 03/24/15 0230  AST 45* 32  ALT 19 16*  ALKPHOS 91 85  BILITOT 0.9 0.8  PROT 6.4* 7.1  ALBUMIN 1.8* 1.9*    Recent Labs Lab 03/21/15 2256  LIPASE 33   No results for input(s): AMMONIA in the last 168 hours. CBC:  Recent Labs Lab 03/21/15 1635 03/21/15 1642 03/22/15 0509 03/23/15 0209 03/24/15 0230  WBC 2.0*  --  1.1* 1.2* 1.1*  NEUTROABS 1.4*  --   --   --   --   HGB 12.8* 12.9* 12.2* 11.3* 11.3*  HCT 37.2* 38.0* 36.4* 33.4* 32.7*  MCV 89.2  --  90.3 88.8 88.4  PLT 51*  --  45* 53* 52*   Cardiac Enzymes: No results for input(s): CKTOTAL, CKMB, CKMBINDEX, TROPONINI in the last 168 hours. BNP (last 3 results)  Recent Labs  09/17/14 1900 03/21/15 1635  BNP 302.1* 272.4*    ProBNP (last 3 results) No results for input(s): PROBNP in the last 8760  hours.  CBG:  Recent Labs Lab 03/24/15 1212 03/24/15 1541 03/24/15 2102 03/25/15 0751 03/25/15 1151  GLUCAP 85 82 108* 82 89    Recent Results (from the past 240 hour(s))  Urine culture     Status: None   Collection Time: 03/21/15  5:00 PM  Result Value Ref Range Status   Specimen Description URINE, CLEAN CATCH  Final   Special Requests NONE  Final   Culture   Final    >=100,000 COLONIES/mL STAPHYLOCOCCUS SPECIES (COAGULASE NEGATIVE) 30,000 COLONIES/mL STAPHYLOCOCCUS AUREUS    Report Status 03/25/2015 FINAL  Final   Organism ID, Bacteria STAPHYLOCOCCUS AUREUS  Final   Organism ID, Bacteria STAPHYLOCOCCUS SPECIES (COAGULASE NEGATIVE)  Final      Susceptibility   Staphylococcus aureus - MIC*    CIPROFLOXACIN <=0.5 SENSITIVE Sensitive     GENTAMICIN <=0.5 SENSITIVE Sensitive     NITROFURANTOIN <=16 SENSITIVE Sensitive     OXACILLIN <=0.25 SENSITIVE Sensitive     TETRACYCLINE <=1 SENSITIVE Sensitive     VANCOMYCIN 1 SENSITIVE Sensitive     TRIMETH/SULFA <=10 SENSITIVE Sensitive     CLINDAMYCIN <=0.25 SENSITIVE Sensitive     RIFAMPIN <=0.5 SENSITIVE Sensitive  Inducible Clindamycin NEGATIVE Sensitive     * 30,000 COLONIES/mL STAPHYLOCOCCUS AUREUS   Staphylococcus species (coagulase negative) - MIC*    CIPROFLOXACIN <=0.5 SENSITIVE Sensitive     GENTAMICIN <=0.5 SENSITIVE Sensitive     OXACILLIN <=0.25 SENSITIVE Sensitive     TETRACYCLINE <=1 SENSITIVE Sensitive     CLINDAMYCIN <=0.25 SENSITIVE Sensitive     * >=100,000 COLONIES/mL STAPHYLOCOCCUS SPECIES (COAGULASE NEGATIVE)  Blood Culture (routine x 2)     Status: None (Preliminary result)   Collection Time: 03/21/15  6:32 PM  Result Value Ref Range Status   Specimen Description BLOOD RIGHT FOREARM  Final   Special Requests BOTTLES DRAWN AEROBIC AND ANAEROBIC 5ML  Final   Culture NO GROWTH 3 DAYS  Final   Report Status PENDING  Incomplete  Blood Culture (routine x 2)     Status: None (Preliminary result)    Collection Time: 03/21/15  7:22 PM  Result Value Ref Range Status   Specimen Description BLOOD RIGHT HAND  Final   Special Requests   Final    BOTTLES DRAWN AEROBIC ONLY 5CC PT ON ZOSYN,VANCOMYCIN   Culture NO GROWTH 3 DAYS  Final   Report Status PENDING  Incomplete  MRSA PCR Screening     Status: None   Collection Time: 03/22/15 12:03 AM  Result Value Ref Range Status   MRSA by PCR NEGATIVE NEGATIVE Final    Comment:        The GeneXpert MRSA Assay (FDA approved for NASAL specimens only), is one component of a comprehensive MRSA colonization surveillance program. It is not intended to diagnose MRSA infection nor to guide or monitor treatment for MRSA infections.      Studies:  Recent x-ray studies have been reviewed in detail by the Attending Physician  Scheduled Meds:  Scheduled Meds: . insulin aspart  0-5 Units Subcutaneous QHS  . insulin aspart  0-9 Units Subcutaneous TID WC  . levofloxacin  500 mg Oral Q48H  . levothyroxine  25 mcg Oral QAC breakfast  . metoprolol tartrate  50 mg Oral BID  . pravastatin  40 mg Oral Daily  . warfarin  3 mg Oral ONCE-1800  . Warfarin - Pharmacist Dosing Inpatient   Does not apply q1800    Time spent on care of this patient: 49 mins   Domenic Polite , MD [phone]  [facility] Office  [phone] Pager - [phone]  On-Call/Text Page:      [email or web address]      password [email or web address]  If 7PM-7AM, please contact night-coverage [email or web address] Password [email or web address] 03/25/2015, 1:35 PM   LOS: 4 days

## 2015-03-25 NOTE — Progress Notes (Signed)
NS bolus and lopressor given. HR in the 120s now.

## 2015-03-25 NOTE — Progress Notes (Signed)
ANTICOAGULATION CONSULT NOTE - Follow Up Consult  Pharmacy Consult:  Coumadin Indication: atrial fibrillation  Allergies  Allergen Reactions  . Cefpodoxime Proxetil Other (See Comments)    unknown    Patient Measurements: Height: 5\' 8"  (172.7 cm) Weight: 155 lb 8 oz (70.534 kg) IBW/kg (Calculated) : 68.4  Vital Signs: Temp: 97.4 F (36.3 C) (08/03 0514) Temp Source: Oral (08/03 0514) BP: 95/74 mmHg (08/03 0514) Pulse Rate: 68 (08/03 0514)  Labs:  Recent Labs  03/23/15 0209 03/23/15 1955 03/24/15 0230 03/25/15 0519  HGB 11.3*  --  11.3*  --   HCT 33.4*  --  32.7*  --   PLT 53*  --  52*  --   LABPROT  --  34.5* 31.6* 24.5*  INR  --  3.52* 3.13* 2.23*  CREATININE 1.18  --  1.02  --     Estimated Creatinine Clearance: 45.6 mL/min (by C-G formula based on Cr of 1.02).     Assessment: 32 YOM with history of Afib to continue on Coumadin.  Patient was admitted with an elevated INR of 3.57 and Coumadin has been on hold until 03/24/15.  INR decreased to therapeutic level today.  He has a history of thrombocytopenia but no overt bleeding reported.  Patient is currently on Levaquin, which could increase the effect of Coumadin.  Home Coumadin dose: 4mg  daily except 6mg  on Thurs and Sun   Goal of Therapy:  INR 2-3    Plan:  - Coumadin 3mg  PO today - Daily PT / INR - Consider discontinuing SSI/CBG checks, resume home meds   Xxavier Noon D. Mina Marble, PharmD, BCPS Pager:  [phone] 03/25/2015, 8:22 AM

## 2015-03-26 DIAGNOSIS — N179 Acute kidney failure, unspecified: Secondary | ICD-10-CM

## 2015-03-26 DIAGNOSIS — I4891 Unspecified atrial fibrillation: Secondary | ICD-10-CM | POA: Insufficient documentation

## 2015-03-26 LAB — BASIC METABOLIC PANEL
Anion gap: 4 — ABNORMAL LOW (ref 5–15)
BUN: 15 mg/dL (ref 6–20)
CALCIUM: 8 mg/dL — AB (ref 8.9–10.3)
CO2: 26 mmol/L (ref 22–32)
Chloride: 104 mmol/L (ref 101–111)
Creatinine, Ser: 0.93 mg/dL (ref 0.61–1.24)
GFR calc Af Amer: >60 mL/min (ref >60)
Glucose, Bld: 86 mg/dL (ref 65–99)
POTASSIUM: 4.9 mmol/L (ref 3.5–5.1)
Sodium: 134 mmol/L — ABNORMAL LOW (ref 135–145)

## 2015-03-26 LAB — PROTIME-INR
INR: 1.86 — AB (ref 0.00–1.49)
PROTHROMBIN TIME: 21.4 s — AB (ref 11.6–15.2)

## 2015-03-26 LAB — CBC
HEMATOCRIT: 32.8 % — AB (ref 39.0–52.0)
Hemoglobin: 10.9 g/dL — ABNORMAL LOW (ref 13.0–17.0)
MCH: 29.8 pg (ref 26.0–34.0)
MCHC: 33.2 g/dL (ref 30.0–36.0)
MCV: 89.6 fL (ref 78.0–100.0)
PLATELETS: 60 10*3/uL — AB (ref 150–400)
RBC: 3.66 MIL/uL — AB (ref 4.22–5.81)
RDW: 14.9 % (ref 11.5–15.5)
WBC: 0.9 10*3/uL — AB (ref 4.0–10.5)

## 2015-03-26 LAB — CULTURE, BLOOD (ROUTINE X 2)
CULTURE: NO GROWTH
Culture: NO GROWTH

## 2015-03-26 LAB — GLUCOSE, CAPILLARY: Glucose-Capillary: 88 mg/dL (ref 65–99)

## 2015-03-26 MED ORDER — METOPROLOL TARTRATE 25 MG PO TABS: 50.0000 mg | ORAL_TABLET | Freq: Two times a day (BID) | ORAL | Status: DC

## 2015-03-26 MED ORDER — LEVOFLOXACIN 500 MG PO TABS: 500.0000 mg | ORAL_TABLET | ORAL | Status: AC

## 2015-03-26 NOTE — Care Management Note (Signed)
Case Management Note  Patient Details  Name: Kevin Rubio MRN: [id number] Date of Birth: 1922-05-22  Subjective/Objective:                    Action/Plan:  Confirmed face sheet information with patient and his wife . Expected Discharge Date:                  Expected Discharge Plan:  [place]  In-House Referral:     Discharge planning Services  CM Consult  Post Acute Care Choice:    Choice offered to:  Spouse  DME Arranged:    DME Agency:     HH Arranged:  PT [place]:  [place]  Status of Service:  Completed, signed off  Medicare Important Message Given:  Yes-second notification given Date Medicare IM Given:    Medicare IM give by:    Date Additional Medicare IM Given:    Additional Medicare Important Message give by:     If discussed at [place] of Stay Meetings, dates discussed:    Additional Comments:  Marilu Favre, RN 03/26/2015, 10:18 AM

## 2015-03-26 NOTE — Progress Notes (Signed)
Kevin Rubio to be D/C'd Home per MD order.  Discussed with the patient and all questions fully answered.  VSS.  IV catheter discontinued intact. Site without signs and symptoms of complications. Dressing and pressure applied.  An After Visit Summary was printed and given to the patient. Patient received prescription.  D/c education completed with patient/family including follow up instructions, medication list, d/c activities limitations if indicated, with other d/c instructions as indicated by MD - patient able to verbalize understanding, all questions fully answered.   Patient instructed to return to ED, call 911, or call MD for any changes in condition.   Patient escorted via [place], and D/C home via private auto.  Kevin Rubio 03/26/2015 10:16 AM

## 2015-03-27 ENCOUNTER — Telehealth: Payer: Self-pay | Admitting: *Deleted

## 2015-03-27 DIAGNOSIS — I2510 Atherosclerotic heart disease of native coronary artery without angina pectoris: Secondary | ICD-10-CM | POA: Diagnosis not present

## 2015-03-27 DIAGNOSIS — E785 Hyperlipidemia, unspecified: Secondary | ICD-10-CM | POA: Diagnosis not present

## 2015-03-27 DIAGNOSIS — A4101 Sepsis due to Methicillin susceptible Staphylococcus aureus: Secondary | ICD-10-CM | POA: Diagnosis not present

## 2015-03-27 DIAGNOSIS — I4891 Unspecified atrial fibrillation: Secondary | ICD-10-CM | POA: Diagnosis not present

## 2015-03-27 DIAGNOSIS — I10 Essential (primary) hypertension: Secondary | ICD-10-CM | POA: Diagnosis not present

## 2015-03-27 DIAGNOSIS — N390 Urinary tract infection, site not specified: Secondary | ICD-10-CM | POA: Diagnosis not present

## 2015-03-27 DIAGNOSIS — Z7901 Long term (current) use of anticoagulants: Secondary | ICD-10-CM | POA: Diagnosis not present

## 2015-03-27 DIAGNOSIS — N400 Enlarged prostate without lower urinary tract symptoms: Secondary | ICD-10-CM | POA: Diagnosis not present

## 2015-03-27 DIAGNOSIS — M069 Rheumatoid arthritis, unspecified: Secondary | ICD-10-CM | POA: Diagnosis not present

## 2015-03-27 DIAGNOSIS — I252 Old myocardial infarction: Secondary | ICD-10-CM | POA: Diagnosis not present

## 2015-03-27 DIAGNOSIS — N453 Epididymo-orchitis: Secondary | ICD-10-CM | POA: Diagnosis not present

## 2015-03-27 DIAGNOSIS — Z792 Long term (current) use of antibiotics: Secondary | ICD-10-CM | POA: Diagnosis not present

## 2015-03-27 NOTE — Telephone Encounter (Signed)
Verbal orders given per Dr Burnice Logan for PT and Skilled nursing for patient for follow up to hospital visit.

## 2015-03-30 ENCOUNTER — Telehealth: Payer: Self-pay | Admitting: Internal Medicine

## 2015-03-30 DIAGNOSIS — E785 Hyperlipidemia, unspecified: Secondary | ICD-10-CM | POA: Diagnosis not present

## 2015-03-30 DIAGNOSIS — I4891 Unspecified atrial fibrillation: Secondary | ICD-10-CM | POA: Diagnosis not present

## 2015-03-30 DIAGNOSIS — M069 Rheumatoid arthritis, unspecified: Secondary | ICD-10-CM | POA: Diagnosis not present

## 2015-03-30 DIAGNOSIS — I252 Old myocardial infarction: Secondary | ICD-10-CM | POA: Diagnosis not present

## 2015-03-30 DIAGNOSIS — I10 Essential (primary) hypertension: Secondary | ICD-10-CM | POA: Diagnosis not present

## 2015-03-30 DIAGNOSIS — N453 Epididymo-orchitis: Secondary | ICD-10-CM | POA: Diagnosis not present

## 2015-03-30 DIAGNOSIS — N390 Urinary tract infection, site not specified: Secondary | ICD-10-CM | POA: Diagnosis not present

## 2015-03-30 DIAGNOSIS — N400 Enlarged prostate without lower urinary tract symptoms: Secondary | ICD-10-CM | POA: Diagnosis not present

## 2015-03-30 DIAGNOSIS — Z7901 Long term (current) use of anticoagulants: Secondary | ICD-10-CM | POA: Diagnosis not present

## 2015-03-30 DIAGNOSIS — I2510 Atherosclerotic heart disease of native coronary artery without angina pectoris: Secondary | ICD-10-CM | POA: Diagnosis not present

## 2015-03-30 DIAGNOSIS — A4101 Sepsis due to Methicillin susceptible Staphylococcus aureus: Secondary | ICD-10-CM | POA: Diagnosis not present

## 2015-03-30 DIAGNOSIS — Z792 Long term (current) use of antibiotics: Secondary | ICD-10-CM | POA: Diagnosis not present

## 2015-03-30 NOTE — Telephone Encounter (Signed)
Home health saw pt today due to a complaint of him not feeling well. Swelling in scrotum has gone down but is still firm. Tip pf penis is red and he is having trouble getting through the foreskin. Pt is uncircumcised. Sometimes he is having to urinate through foreskin.

## 2015-03-30 NOTE — Telephone Encounter (Signed)
Spoke to pt and his wife told them Dr.K wants him to use Lotrimin cream OTC twice a day, apply to scrotum and penis for 2 weeks. Juanita verbalized understanding and said pt has an appt on Wed with Dr.K. Told her okay that is fine but needs to start cream per Dr.K. [place] verbalized understanding.

## 2015-03-30 NOTE — Telephone Encounter (Signed)
Please see message and advise 

## 2015-03-30 NOTE — Telephone Encounter (Signed)
Have patient use Lotrimin cream twice daily for 2 weeks

## 2015-03-31 ENCOUNTER — Telehealth: Payer: Self-pay | Admitting: Internal Medicine

## 2015-03-31 ENCOUNTER — Emergency Department (HOSPITAL_COMMUNITY)
Admission: EM | Admit: 2015-03-31 | Discharge: 2015-03-31 | Disposition: A | Discharge status: Home / Self Care | Payer: Medicare Other | Attending: Emergency Medicine | Admitting: Emergency Medicine

## 2015-03-31 ENCOUNTER — Encounter (HOSPITAL_COMMUNITY): Payer: Self-pay | Admitting: Physical Medicine and Rehabilitation

## 2015-03-31 DIAGNOSIS — N3289 Other specified disorders of bladder: Secondary | ICD-10-CM

## 2015-03-31 DIAGNOSIS — D649 Anemia, unspecified: Secondary | ICD-10-CM | POA: Insufficient documentation

## 2015-03-31 DIAGNOSIS — I10 Essential (primary) hypertension: Secondary | ICD-10-CM | POA: Diagnosis not present

## 2015-03-31 DIAGNOSIS — Z79899 Other long term (current) drug therapy: Secondary | ICD-10-CM | POA: Insufficient documentation

## 2015-03-31 DIAGNOSIS — I252 Old myocardial infarction: Secondary | ICD-10-CM | POA: Insufficient documentation

## 2015-03-31 DIAGNOSIS — Z87891 Personal history of nicotine dependence: Secondary | ICD-10-CM | POA: Diagnosis not present

## 2015-03-31 DIAGNOSIS — E860 Dehydration: Secondary | ICD-10-CM | POA: Diagnosis not present

## 2015-03-31 DIAGNOSIS — R301 Vesical tenesmus: Secondary | ICD-10-CM | POA: Insufficient documentation

## 2015-03-31 DIAGNOSIS — Z7901 Long term (current) use of anticoagulants: Secondary | ICD-10-CM | POA: Diagnosis not present

## 2015-03-31 DIAGNOSIS — E785 Hyperlipidemia, unspecified: Secondary | ICD-10-CM | POA: Diagnosis not present

## 2015-03-31 DIAGNOSIS — I2510 Atherosclerotic heart disease of native coronary artery without angina pectoris: Secondary | ICD-10-CM | POA: Insufficient documentation

## 2015-03-31 DIAGNOSIS — D61818 Other pancytopenia: Secondary | ICD-10-CM | POA: Diagnosis not present

## 2015-03-31 DIAGNOSIS — R339 Retention of urine, unspecified: Secondary | ICD-10-CM | POA: Diagnosis present

## 2015-03-31 LAB — CBC WITH DIFFERENTIAL/PLATELET
Basophils Absolute: 0 10*3/uL (ref 0.0–0.1)
Basophils Relative: 1 % (ref 0–1)
EOS ABS: 0 10*3/uL (ref 0.0–0.7)
EOS PCT: 1 % (ref 0–5)
HEMATOCRIT: 29.1 % — AB (ref 39.0–52.0)
Hemoglobin: 9.7 g/dL — ABNORMAL LOW (ref 13.0–17.0)
LYMPHS ABS: 0.5 10*3/uL — AB (ref 0.7–4.0)
LYMPHS PCT: 31 % (ref 12–46)
MCH: 30.2 pg (ref 26.0–34.0)
MCHC: 33.3 g/dL (ref 30.0–36.0)
MCV: 90.7 fL (ref 78.0–100.0)
MONOS PCT: 22 % — AB (ref 3–12)
Monocytes Absolute: 0.4 10*3/uL (ref 0.1–1.0)
Neutro Abs: 0.8 10*3/uL — ABNORMAL LOW (ref 1.7–7.7)
Neutrophils Relative %: 45 % (ref 43–77)
Platelets: 63 10*3/uL — ABNORMAL LOW (ref 150–400)
RBC: 3.21 MIL/uL — ABNORMAL LOW (ref 4.22–5.81)
RDW: 16.8 % — ABNORMAL HIGH (ref 11.5–15.5)
WBC: 1.7 10*3/uL — AB (ref 4.0–10.5)

## 2015-03-31 LAB — URINALYSIS, ROUTINE W REFLEX MICROSCOPIC
Bilirubin Urine: NEGATIVE
GLUCOSE, UA: NEGATIVE mg/dL
Hgb urine dipstick: NEGATIVE
Ketones, ur: 15 mg/dL — AB
LEUKOCYTES UA: NEGATIVE
Nitrite: NEGATIVE
Protein, ur: NEGATIVE mg/dL
Specific Gravity, Urine: >1.03 — ABNORMAL HIGH (ref 1.005–1.030)
Urobilinogen, UA: 0.2 mg/dL (ref 0.0–1.0)
pH: 5 (ref 5.0–8.0)

## 2015-03-31 LAB — COMPREHENSIVE METABOLIC PANEL
ALK PHOS: 88 U/L (ref 38–126)
ALT: 17 U/L (ref 17–63)
AST: 37 U/L (ref 15–41)
Albumin: 2.6 g/dL — ABNORMAL LOW (ref 3.5–5.0)
Anion gap: 9 (ref 5–15)
BUN: 18 mg/dL (ref 6–20)
CO2: 20 mmol/L — AB (ref 22–32)
Calcium: 8.3 mg/dL — ABNORMAL LOW (ref 8.9–10.3)
Chloride: 102 mmol/L (ref 101–111)
Creatinine, Ser: 1.21 mg/dL (ref 0.61–1.24)
GFR calc Af Amer: 58 mL/min — ABNORMAL LOW (ref >60)
GFR calc non Af Amer: 50 mL/min — ABNORMAL LOW (ref >60)
Glucose, Bld: 120 mg/dL — ABNORMAL HIGH (ref 65–99)
Potassium: 4 mmol/L (ref 3.5–5.1)
SODIUM: 131 mmol/L — AB (ref 135–145)
TOTAL PROTEIN: 7.7 g/dL (ref 6.5–8.1)
Total Bilirubin: 0.9 mg/dL (ref 0.3–1.2)

## 2015-03-31 MED ORDER — SODIUM CHLORIDE 0.9 % IV BOLUS (SEPSIS)
500.0000 mL | Freq: Once | INTRAVENOUS | Status: AC
  Administered 2015-03-31: 500 mL via INTRAVENOUS

## 2015-03-31 NOTE — ED Notes (Addendum)
Pt presents to department for evaluation of urinary retention. Decreased urine output Monday evening and today. Reports lower abdominal pressure. Pt is alert and oriented x4. Was recently discharge from hospital for UTI.

## 2015-03-31 NOTE — Discharge Instructions (Signed)
Dehydration, Adult Dehydration is when you lose more fluids from the body than you take in. Vital organs like the kidneys, brain, and heart cannot function without a proper amount of fluids and salt. Any loss of fluids from the body can cause dehydration.  CAUSES   Vomiting.  Diarrhea.  Excessive sweating.  Excessive urine output.  Fever. SYMPTOMS  Mild dehydration  Thirst.  Dry lips.  Slightly dry mouth. Moderate dehydration  Very dry mouth.  Sunken eyes.  Skin does not bounce back quickly when lightly pinched and released.  Dark urine and decreased urine production.  Decreased tear production.  Headache. Severe dehydration  Very dry mouth.  Extreme thirst.  Rapid, weak pulse (more than 100 beats per minute at rest).  Cold hands and feet.  Not able to sweat in spite of heat and temperature.  Rapid breathing.  Blue lips.  Confusion and lethargy.  Difficulty being awakened.  Minimal urine production.  No tears. DIAGNOSIS  Your caregiver will diagnose dehydration based on your symptoms and your exam. Blood and urine tests will help confirm the diagnosis. The diagnostic evaluation should also identify the cause of dehydration. TREATMENT  Treatment of mild or moderate dehydration can often be done at home by increasing the amount of fluids that you drink. It is best to drink small amounts of fluid more often. Drinking too much at one time can make vomiting worse. Refer to the home care instructions below. Severe dehydration needs to be treated at the hospital where you will probably be given intravenous (IV) fluids that contain water and electrolytes. HOME CARE INSTRUCTIONS   Ask your caregiver about specific rehydration instructions.  Drink enough fluids to keep your urine clear or pale yellow.  Drink small amounts frequently if you have nausea and vomiting.  Eat as you normally do.  Avoid:  Foods or drinks high in sugar.  Carbonated  drinks.  Juice.  Extremely hot or cold fluids.  Drinks with caffeine.  Fatty, greasy foods.  Alcohol.  Tobacco.  Overeating.  Gelatin desserts.  Wash your hands well to avoid spreading bacteria and viruses.  Only take over-the-counter or prescription medicines for pain, discomfort, or fever as directed by your caregiver.  Ask your caregiver if you should continue all prescribed and over-the-counter medicines.  Keep all follow-up appointments with your caregiver. SEEK MEDICAL CARE IF:  You have abdominal pain and it increases or stays in one area (localizes).  You have a rash, stiff neck, or severe headache.  You are irritable, sleepy, or difficult to awaken.  You are weak, dizzy, or extremely thirsty. SEEK IMMEDIATE MEDICAL CARE IF:   You are unable to keep fluids down or you get worse despite treatment.  You have frequent episodes of vomiting or diarrhea.  You have blood or green matter (bile) in your vomit.  You have blood in your stool or your stool looks black and tarry.  You have not urinated in 6 to 8 hours, or you have only urinated a small amount of very dark urine.  You have a fever.  You faint. MAKE SURE YOU:   Understand these instructions.  Will watch your condition.  Will get help right away if you are not doing well or get worse. Document Released: 08/08/2005 Document Revised: 10/31/2011 Document Reviewed: 03/28/2011 ExitCare Patient Information 2015 [employer]. This information is not intended to replace advice given to you by your health care provider. Make sure you discuss any questions you have with your health care   provider.  

## 2015-03-31 NOTE — ED Notes (Signed)
Pt hypotensive while sleeping.  Pt awoken by this RN, new BP reading of 108/51 obtained.  Dr. Betsey Holiday made aware.

## 2015-03-31 NOTE — Telephone Encounter (Signed)
Home health referral.  Okay.  May need in and out cath and urological referral

## 2015-03-31 NOTE — Telephone Encounter (Signed)
Therapist was supposed see pt twice this week but pt has opted to wait until he sees Dr Raliegh Ip. Therapist is concerned and feels we might want to reach out to patient re: recurrent sepsis. Also wanted you to be aware that Metoprolol tartarate Rx is different from his discharge summary and patient needs clarification on correct dose.

## 2015-03-31 NOTE — Telephone Encounter (Signed)
[place]    --------------------------------------------------------------------------------   Patient Name: Kevin Rubio  DOB: 01-Sep-1921    Initial Comment Caller states he has an appt with Dr. and having problems urinating.        Nurse Assessment  Nurse: Luther Parody, RN, Malachy Mood Date/Time (Eastern Time): 03/31/2015 4:23:24 PM  Confirm and document reason for call. If symptomatic, describe symptoms. ---Caller states that he has been having difficulty voiding since last pm. States that he has tried to void 4-5 times today but has been able to go very little. Denies pain. He was hospitalized last wk for a UTI and was discharged home on Thursday. Prior to admission he was having swelling in his testicles and that has been present since discharge. He had a home health nurse out yesterday and during her assessment she noted redness to the tip of his penis so she called his pcp and obtained an ointment that he is applying daily. Denies any pain or other symptoms today.    Has the patient traveled out of the country within the last 30 days? ---Not Applicable    Does the patient require triage? ---Yes    Related visit to physician within the last 2 weeks? ---Yes    Does the PT have any chronic conditions? (i.e. diabetes, asthma, etc.) ---Yes    List chronic conditions. ---htn, cardiac           Guidelines      Guideline Title Affirmed Question Affirmed Notes  Urinary Tract Infection on Antibiotic Follow-up Call - Male Patient sounds very sick or weak to the triager      Final Disposition User    Go to ED Now (or PCP triage) Luther Parody, RN, [facility] - ED    Disagree/Comply: Comply

## 2015-03-31 NOTE — ED Provider Notes (Signed)
CSN: [id number]     Arrival date & time 03/31/15  1748 History   First MD Initiated Contact with Patient 03/31/15 1947     Chief Complaint  Patient presents with  . Urinary Retention     (Consider location/radiation/quality/duration/timing/severity/associated sxs/prior Treatment) HPI Comments: Patient presents to the emergency department stating that he has been unable to urinate since yesterday. Patient reports that he was up all night trying to urinate but could not pass any urine. He has only been able to pass a small amount today, feels distended and pain in the area of the low pelvis and bladder. No fever, nausea, vomiting.   Past Medical History  Diagnosis Date  . ANEMIA, PERNICIOUS 05/14/2007  . Atrial fibrillation 08/08/2007  . BENIGN PROSTATIC HYPERTROPHY 03/05/2007  . CORONARY ARTERY DISEASE 03/05/2007  . DYSPLASTIC NEVUS 01/24/2008  . HYPERLIPIDEMIA 03/05/2007  . HYPERTENSION 03/05/2007  . MYOCARDIAL INFARCTION, HX OF 03/05/2007    1982  . NEUTROPENIA NOS 05/11/2007  . Rheumatoid arthritis(714.0) 03/05/2007  . VITAMIN B12 DEFICIENCY 08/08/2007  . Current use of long term anticoagulation   . Rheumatoid arthritis(714.0)   . Hernia     umbilical   Past Surgical History  Procedure Laterality Date  . Laparoscopic cholecystectomy  11/09/2001    Dr Lennie Hummer  . Nasal polyp surgery    . Ptca  15 yrs ago  . External ear surgery  2 years ago  . Laparoscopic inguinal hernia repair  01/27/11    right direct, Dr Greer Pickerel  . Hernia repair  2012   History reviewed. No pertinent family history. History  Substance Use Topics  . Smoking status: Former Smoker    Types: Cigarettes    Quit date: 08/22/1978  . Smokeless tobacco: Never Used  . Alcohol Use: No    Review of Systems  Genitourinary: Positive for decreased urine volume.  All other systems reviewed and are negative.     Allergies  Cefpodoxime proxetil  Home Medications   Prior to Admission medications    Medication Sig Start Date End Date Taking? Authorizing Provider  acetaminophen (TYLENOL) 500 MG tablet Take 1,000 mg by mouth daily as needed for mild pain.   Yes Historical Provider, MD  ascorbic Acid (VITAMIN C) 500 MG CPCR Take 500 mg by mouth daily.     Yes Historical Provider, MD  cyanocobalamin (,VITAMIN B-12,) 1000 MCG/ML injection Inject 1,000 mcg into the muscle every 30 (thirty) days.   Yes Historical Provider, MD  cycloSPORINE (RESTASIS) 0.05 % ophthalmic emulsion Place 1 drop into both eyes 2 (two) times daily.   Yes Historical Provider, MD  Ferrous Sulfate (IRON CR PO) Take 65 mg by mouth daily.     Yes Historical Provider, MD  guaiFENesin (MUCINEX) 600 MG 12 hr tablet Take 600 mg by mouth daily.    Yes Historical Provider, MD  levothyroxine (SYNTHROID, LEVOTHROID) 25 MCG tablet TAKE ONE TABLET BY MOUTH ONCE DAILY BEFORE  BREAKFAST 11/28/14  Yes Marletta Lor, MD  metoprolol tartrate (LOPRESSOR) 25 MG tablet Take 2 tablets (50 mg total) by mouth 2 (two) times daily. 03/26/15  Yes Domenic Polite, MD  Multiple Vitamins-Minerals (CENTRUM SILVER PO) Take 1 tablet by mouth daily.    Yes Historical Provider, MD  Omega-3 Fatty Acids (FISH OIL) 1000 MG CAPS Take 2 capsules by mouth daily.     Yes Historical Provider, MD  pravastatin (PRAVACHOL) 40 MG tablet TAKE ONE TABLET BY MOUTH ONCE DAILY 10/13/14  Yes Marletta Lor,  MD  warfarin (COUMADIN) 4 MG tablet Take 4-6 mg by mouth daily. 6 mg on Thurs and Sunday.  4 mg all other days.   Yes Historical Provider, MD   BP 101/47 mmHg  Pulse 73  Temp(Src) 97.1 F (36.2 C) (Rectal)  Resp 24  SpO2 97% Physical Exam  Constitutional: He is oriented to person, place, and time. He appears well-developed and well-nourished. No distress.  HENT:  Head: Normocephalic and atraumatic.  Right Ear: Hearing normal.  Left Ear: Hearing normal.  Nose: Nose normal.  Mouth/Throat: Oropharynx is clear and moist and mucous membranes are normal.  Eyes:  Conjunctivae and EOM are normal. Pupils are equal, round, and reactive to light.  Neck: Normal range of motion. Neck supple.  Cardiovascular: Regular rhythm, S1 normal and S2 normal.  Exam reveals no gallop and no friction rub.   No murmur heard. Pulmonary/Chest: Effort normal and breath sounds normal. No respiratory distress. He exhibits no tenderness.  Abdominal: Soft. Normal appearance and bowel sounds are normal. There is no hepatosplenomegaly. There is tenderness in the suprapubic area. There is no rebound, no guarding, no tenderness at McBurney's point and negative Murphy's sign. No hernia.  Musculoskeletal: Normal range of motion.  Neurological: He is alert and oriented to person, place, and time. He has normal strength. No cranial nerve deficit or sensory deficit. Coordination normal. GCS eye subscore is 4. GCS verbal subscore is 5. GCS motor subscore is 6.  Skin: Skin is warm, dry and intact. No rash noted. No cyanosis.  Psychiatric: He has a normal mood and affect. His speech is normal and behavior is normal. Thought content normal.  Nursing note and vitals reviewed.   ED Course  Procedures (including critical care time) Labs Review Labs Reviewed  CBC WITH DIFFERENTIAL/PLATELET - Abnormal; Notable for the following:    WBC 1.7 (*)    RBC 3.21 (*)    Hemoglobin 9.7 (*)    HCT 29.1 (*)    RDW 16.8 (*)    Platelets 63 (*)    Monocytes Relative 22 (*)    Neutro Abs 0.8 (*)    Lymphs Abs 0.5 (*)    All other components within normal limits  COMPREHENSIVE METABOLIC PANEL - Abnormal; Notable for the following:    Sodium 131 (*)    CO2 20 (*)    Glucose, Bld 120 (*)    Calcium 8.3 (*)    Albumin 2.6 (*)    GFR calc non Af Amer 50 (*)    GFR calc Af Amer 58 (*)    All other components within normal limits  URINALYSIS, ROUTINE W REFLEX MICROSCOPIC (NOT AT [facility]) - Abnormal; Notable for the following:    Specific Gravity, Urine >1.030 (*)    Ketones, ur 15 (*)    All other  components within normal limits    Imaging Review No results found.   EKG Interpretation None      MDM   Final diagnoses:  Dehydration  Bladder spasm    Presents to the emergency department for evaluation of urinary urgency. Patient thought that he was expressing urinary retention, however, bladder ultrasound showed less than 50 mL of urine in his bladder. Urinalysis does not show obvious infection. Patient is currently being treated for orchitis. He is likely expressing some bladder spasm. He also had evidence of mild dehydration. Patient administered IV fluids here in the ER. He was given Pyridium for symptomatic relief and has follow-up with his primary doctor in the  morning.    Orpah Greek, MD 03/31/15 [phone]

## 2015-03-31 NOTE — Telephone Encounter (Signed)
We got a message that pt has had a very little urine output today, wife is concerned. I spoke with pt's wife and she is going to take pt to [facility] ER now. She will call back here to schedule a follow up with pt. I did give this information to Dr. Raliegh Ip and he agrees that pt should go to ER.

## 2015-04-01 ENCOUNTER — Ambulatory Visit (INDEPENDENT_AMBULATORY_CARE_PROVIDER_SITE_OTHER): Payer: Medicare Other | Admitting: Internal Medicine

## 2015-04-01 ENCOUNTER — Encounter: Payer: Self-pay | Admitting: Internal Medicine

## 2015-04-01 VITALS — BP 110/60 | HR 74 | Temp 97.7°F | Wt 150.0 lb

## 2015-04-01 DIAGNOSIS — D72819 Decreased white blood cell count, unspecified: Secondary | ICD-10-CM

## 2015-04-01 DIAGNOSIS — N452 Orchitis: Secondary | ICD-10-CM

## 2015-04-01 DIAGNOSIS — D696 Thrombocytopenia, unspecified: Secondary | ICD-10-CM | POA: Diagnosis not present

## 2015-04-01 DIAGNOSIS — N390 Urinary tract infection, site not specified: Secondary | ICD-10-CM

## 2015-04-01 DIAGNOSIS — N179 Acute kidney failure, unspecified: Secondary | ICD-10-CM

## 2015-04-01 DIAGNOSIS — A419 Sepsis, unspecified organism: Secondary | ICD-10-CM

## 2015-04-01 DIAGNOSIS — N451 Epididymitis: Secondary | ICD-10-CM

## 2015-04-01 DIAGNOSIS — I4891 Unspecified atrial fibrillation: Secondary | ICD-10-CM

## 2015-04-01 DIAGNOSIS — I2510 Atherosclerotic heart disease of native coronary artery without angina pectoris: Secondary | ICD-10-CM

## 2015-04-01 MED ORDER — METOPROLOL TARTRATE 25 MG PO TABS: 25.0000 mg | ORAL_TABLET | Freq: Two times a day (BID) | ORAL | Status: DC

## 2015-04-01 MED ORDER — TAMSULOSIN HCL 0.4 MG PO CAPS: 0.4000 mg | ORAL_CAPSULE | Freq: Every day | ORAL | Status: AC

## 2015-04-01 NOTE — Patient Instructions (Signed)
Drink as much fluid as you  can tolerate over the next few days  Decrease metoprolol to one tablet twice daily  Resume flomax one daily

## 2015-04-01 NOTE — Progress Notes (Signed)
Subjective:    Patient ID: Kevin Rubio, male    DOB: [birth year], 81 y.o.   MRN: [id number]  HPI  81 year old patient who is scheduled for follow-up today at 3 recent hospital discharge for hypotension and urosepsis. Late yesterday afternoon he had difficulty voiding and there was some concern about acute urinary retention.  Bladder scan revealed no significant volume.  Urinalysis was free of infection, but was concentrated with mild ketonuria.  The patient received some IV fluids.  The patient is voiding , but is having frequency and urgency and only voiding small amounts.  No fever or chills  He remains weak, but seems to be improving  He does have a history of BPH and no longer is on Flomax.  He apparently was not discharged on this medication due to sepsis syndrome and hypotension  He has atrial fibrillation coronary artery disease RA and neutropenia   hospital and ED records reviewed  Past Medical History  Diagnosis Date  . ANEMIA, PERNICIOUS 05/14/2007  . Atrial fibrillation 08/08/2007  . BENIGN PROSTATIC HYPERTROPHY 03/05/2007  . CORONARY ARTERY DISEASE 03/05/2007  . DYSPLASTIC NEVUS 01/24/2008  . HYPERLIPIDEMIA 03/05/2007  . HYPERTENSION 03/05/2007  . MYOCARDIAL INFARCTION, HX OF 03/05/2007    1982  . NEUTROPENIA NOS 05/11/2007  . Rheumatoid arthritis(714.0) 03/05/2007  . VITAMIN B12 DEFICIENCY 08/08/2007  . Current use of long term anticoagulation   . Rheumatoid arthritis(714.0)   . Hernia     umbilical    Social History   Social History  . Marital Status: Married    Spouse Name: N/A  . Number of Children: N/A  . Years of Education: N/A   Occupational History  . Not on file.   Social History Main Topics  . Smoking status: Former Smoker    Types: Cigarettes    Quit date: 08/22/1978  . Smokeless tobacco: Never Used  . Alcohol Use: No  . Drug Use: No  . Sexual Activity: Not on file   Other Topics Concern  . Not on file   Social History Narrative    Past  Surgical History  Procedure Laterality Date  . Laparoscopic cholecystectomy  11/09/2001    Dr Lennie Hummer  . Nasal polyp surgery    . Ptca  15 yrs ago  . External ear surgery  2 years ago  . Laparoscopic inguinal hernia repair  01/27/11    right direct, Dr Greer Pickerel  . Hernia repair  2012    No family history on file.  Allergies  Allergen Reactions  . Cefpodoxime Proxetil Other (See Comments)    unknown    Current Outpatient Prescriptions on File Prior to Visit  Medication Sig Dispense Refill  . acetaminophen (TYLENOL) 500 MG tablet Take 1,000 mg by mouth daily as needed for mild pain.    Marland Kitchen ascorbic Acid (VITAMIN C) 500 MG CPCR Take 500 mg by mouth daily.      . cyanocobalamin (,VITAMIN B-12,) 1000 MCG/ML injection Inject 1,000 mcg into the muscle every 30 (thirty) days.    . cycloSPORINE (RESTASIS) 0.05 % ophthalmic emulsion Place 1 drop into both eyes 2 (two) times daily.    . Ferrous Sulfate (IRON CR PO) Take 65 mg by mouth daily.      Marland Kitchen guaiFENesin (MUCINEX) 600 MG 12 hr tablet Take 600 mg by mouth daily.     Marland Kitchen levothyroxine (SYNTHROID, LEVOTHROID) 25 MCG tablet TAKE ONE TABLET BY MOUTH ONCE DAILY BEFORE  BREAKFAST 90 tablet 1  .  metoprolol tartrate (LOPRESSOR) 25 MG tablet Take 2 tablets (50 mg total) by mouth 2 (two) times daily. 60 tablet 0  . Multiple Vitamins-Minerals (CENTRUM SILVER PO) Take 1 tablet by mouth daily.     . Omega-3 Fatty Acids (FISH OIL) 1000 MG CAPS Take 2 capsules by mouth daily.      . pravastatin (PRAVACHOL) 40 MG tablet TAKE ONE TABLET BY MOUTH ONCE DAILY 90 tablet 1  . warfarin (COUMADIN) 4 MG tablet Take 4-6 mg by mouth daily. 6 mg on Thurs and Sunday.  4 mg all other days.     No current facility-administered medications on file prior to visit.    BP 110/60 mmHg  Pulse 74  Temp(Src) 97.7 F (36.5 C) (Oral)  Wt 150 lb (68.04 kg)  SpO2 96%      Review of Systems  Constitutional: Positive for fatigue. Negative for fever, chills and appetite  change.  HENT: Negative for congestion, dental problem, ear pain, hearing loss, sore throat, tinnitus, trouble swallowing and voice change.   Eyes: Negative for pain, discharge and visual disturbance.  Respiratory: Negative for cough, chest tightness, wheezing and stridor.   Cardiovascular: Negative for chest pain, palpitations and leg swelling.  Gastrointestinal: Negative for nausea, vomiting, abdominal pain, diarrhea, constipation, blood in stool and abdominal distention.  Genitourinary: Positive for urgency, frequency and difficulty urinating. Negative for hematuria, flank pain, discharge and genital sores.  Musculoskeletal: Negative for myalgias, back pain, joint swelling, arthralgias, gait problem and neck stiffness.  Skin: Negative for rash.  Neurological: Positive for weakness. Negative for dizziness, syncope, speech difficulty, numbness and headaches.  Hematological: Negative for adenopathy. Does not bruise/bleed easily.  Psychiatric/Behavioral: Negative for behavioral problems and dysphoric mood. The patient is not nervous/anxious.        Objective:   Physical Exam  Constitutional: He is oriented to person, place, and time. He appears well-developed.  Elderly, frail Ambulatory with a walker Afebrile Blood pressure 110/70  HENT:  Head: Normocephalic.  Right Ear: External ear normal.  Left Ear: External ear normal.  Eyes: Conjunctivae and EOM are normal.  Neck: Normal range of motion.  Cardiovascular: Normal rate, regular rhythm and normal heart sounds.   Pulmonary/Chest: Effort normal and breath sounds normal.  Abdominal: Soft. Bowel sounds are normal. He exhibits no distension. There is no tenderness. There is no rebound.  Musculoskeletal: Normal range of motion. He exhibits no edema or tenderness.  Neurological: He is alert and oriented to person, place, and time.  Skin:  Multiple ecchymoses  Psychiatric: He has a normal mood and affect. His behavior is normal.           Assessment & Plan:   Status post urosepsis BPH.  Will resume Flomax.  Patient is having significant urgency and frequency Atrial fibrillation.  Decrease metoprolol to 50 mg twice daily Hypothyroidism Dehydration.  We'll continue to force fluids  Recheck 4 weeks or as needed

## 2015-04-01 NOTE — Telephone Encounter (Signed)
FYI

## 2015-04-01 NOTE — Progress Notes (Signed)
Pre visit review using our clinic review tool, if applicable. No additional management support is needed unless otherwise documented below in the visit note. 

## 2015-04-02 ENCOUNTER — Telehealth: Payer: Self-pay | Admitting: Internal Medicine

## 2015-04-02 NOTE — Telephone Encounter (Signed)
Requesting skill nursing orders for 1 week 4  [phone]

## 2015-04-02 NOTE — Telephone Encounter (Signed)
Ok to give orders  °

## 2015-04-03 LAB — PATHOLOGIST SMEAR REVIEW

## 2015-04-03 NOTE — Discharge Summary (Signed)
Physician Discharge Summary  LEAH SKORA YWV:[id number] DOB: Jan 06, 1922 DOA: 03/21/2015  PCP: Nyoka Cowden, MD  Admit date: 03/21/2015 Discharge date: 03/26/2015  Time spent: 45 minutes  Recommendations for Outpatient Follow-up:  1. PCP Dr.Kwiatowski in 1 week 2. Fu with Urology for Orchitis  Discharge Diagnoses:  Active Problems:   Severe sepsis   Sepsis due to urinary tract infection   Epididymitis   Orchitis   Arterial hypotension   Acute kidney injury   Chronic atrial fibrillation   CAD in native artery   Thrombocytopenia   Leukopenia   Chronic anemia   Atrial fibrillation with rapid ventricular response   Discharge Condition: stable  Diet recommendation: low sodium, heart healthy  Filed Weights   03/24/15 2051 03/25/15 2130 03/26/15 0550  Weight: 70.534 kg (155 lb 8 oz) 72 kg (158 lb 11.7 oz) 68.992 kg (152 lb 1.6 oz)    History of present illness:  81 y/o WM PMHx high functional status but history of longstanding leukopenia, thrombocytopenia (marrow bx in 2010 unrevealing), atrial fibrillation, CAD with remote hx of MI, as well RA  Presented to the ED with a 1-2 week history of progressive weakness, chills, and urinary frequncey. On presentation, he was found to be febrile.  He also reported some scrotal swelling and pain.  Hospital Course:   Severe Sepsis due to Staphylococcus MSSA UTI / orchitis / epididymitis -was admitted to step down unit and initially treated with broad spectrum Abx, fluid resuscitation, clinically improved -urine cultures grew MSSA -Dr. Sherral Hammers d/w Dr. Flossie Dibble (ID) and agrees 10 days of levofloxacin would be appropriate, completed 5 days of Abx so far -continues to improve clinically, i picked him up yesterday from my colleague and now ambulating with physical therapy -discharged home on oral levaquin to complete course, he is also advised to FU with Urology  Hypotension -improved  Acute kidney injury  -Resolved with fluid  resuscitation   Orchitis /groin pain / R inguinal hernia -continued scrotal swelling with pain to palpation Lt>>Rt. -improving -Continue levofloxacin for full ten-day course  -FU with Urology, seen by [facility] Dr.Ramirez this admission, according to him patient has small R inguinal hernia with no incarceration, self reducible.  Chronic Afib -Rate controlled, currently in NSR - continue chronic anticoagulation   CAD in native artery -stable  RA  -Quiescent  Chronic Pancytopenia -Anemia / Leukopenia / thrombocytopenia are stable -All counts appear to be stable at this time,  -History of bone marrow aspiration cytology in 06/2009 which showed hypercellular trilineage hematopoiesis -Follow-up with hematology  Consultations:  [facility] Dr.Ramirez  Discharge Exam: Filed Vitals:   03/26/15 0551  BP: 94/53  Pulse: 81  Temp: 97.5 F (36.4 C)  Resp: 22    General: AAOx3 Cardiovascular: S1S2/RRR Respiratory: CTAB  Discharge Instructions   Discharge Instructions    Diet - low sodium heart healthy    Complete by:  As directed      Increase activity slowly    Complete by:  As directed           Discharge Medication List as of 03/26/2015 10:17 AM    START taking these medications   Details  levofloxacin (LEVAQUIN) 500 MG tablet Take 1 tablet (500 mg total) by mouth every other day. For 4 days, Starting 03/26/2015, Until Mon 03/30/15, Print      CONTINUE these medications which have CHANGED   Details  metoprolol tartrate (LOPRESSOR) 25 MG tablet Take 2 tablets (50 mg total) by mouth 2 (two)  times daily., Starting 03/26/2015, Until Discontinued, Normal      CONTINUE these medications which have NOT CHANGED   Details  acetaminophen (TYLENOL) 500 MG tablet Take 1,000 mg by mouth daily as needed for mild pain., Until Discontinued, Historical Med    ascorbic Acid (VITAMIN C) 500 MG CPCR Take 500 mg by mouth daily.  , Until Discontinued, Historical Med    cyanocobalamin (,VITAMIN  B-12,) 1000 MCG/ML injection Inject 1,000 mcg into the muscle every 30 (thirty) days., Until Discontinued, Historical Med    Ferrous Sulfate (IRON CR PO) Take 65 mg by mouth daily.  , Until Discontinued, Historical Med    levothyroxine (SYNTHROID, LEVOTHROID) 25 MCG tablet TAKE ONE TABLET BY MOUTH ONCE DAILY BEFORE  BREAKFAST, Normal    Multiple Vitamins-Minerals (CENTRUM SILVER PO) Take 1 tablet by mouth daily. , Until Discontinued, Historical Med    Omega-3 Fatty Acids (FISH OIL) 1000 MG CAPS Take 2 capsules by mouth daily.  , Until Discontinued, Historical Med    pravastatin (PRAVACHOL) 40 MG tablet TAKE ONE TABLET BY MOUTH ONCE DAILY, Normal    warfarin (COUMADIN) 4 MG tablet Take 4-6 mg by mouth daily. 6 mg on Thurs and Sunday.  4 mg all other days., Until Discontinued, Historical Med    guaiFENesin (MUCINEX) 600 MG 12 hr tablet Take 600 mg by mouth daily. , Until Discontinued, Historical Med      STOP taking these medications     ofloxacin (FLOXIN) 0.3 % otic solution        Allergies  Allergen Reactions  . Cefpodoxime Proxetil Other (See Comments)    unknown   Follow-up Information    Follow up with Nyoka Cowden, MD. Schedule an appointment as soon as possible for a visit in 1 week.   Specialty:  Internal Medicine   Contact information:   [place] [address] [phone]       Follow up with Nyoka Cowden, MD On 04/01/2015.   Specialty:  Internal Medicine   Why:  appointment time is 8:15 am   Contact information:   [place] [address] [phone]        The results of significant diagnostics from this hospitalization (including imaging, microbiology, ancillary and laboratory) are listed below for reference.    Significant Diagnostic Studies: US Scrotum  03/22/2015   CLINICAL DATA:  Scrotal swelling and redness. Previous hernia repair.  EXAM: ULTRASOUND OF SCROTUM  TECHNIQUE: Complete  ultrasound examination of the testicles, epididymis, and other scrotal structures was performed.  COMPARISON:  CT 03/21/2015  FINDINGS: Right testicle  Measurements: 2.8 x 3.1 x 3.6 cm. No mass or microlithiasis visualized. Increased color Doppler flow.  Left testicle  Measurements: 2.5 x 2.8 x 4.5 cm. No mass or microlithiasis visualized. Mild ectasia of the rete testis. Normal color Doppler flow.  Right epididymis: Diffuse increased vascularity with mild heterogeneous enlargement.  Left epididymis:  Normal in size and appearance.  Hydrocele: Moderate size somewhat complex right hydrocele multiple septations. Small left hydrocele.  Varicocele:  Small bilateral varicoceles right worse than left.  Few nonspecific left scrotal pearls.  IMPRESSION: Enlarged heterogeneous right epididymis with increased vascularity to the right epididymis and testicle. Moderate size mildly complex right hydrocele versus pyocele. Findings most typical of infection/epididymal orchitis.  Small left hydrocele.  Small bilateral varicoceles.   Electronically Signed   By: Marin Olp M.D.   On: 03/22/2015 15:14   Ct Abdomen Pelvis W Contrast  03/21/2015  CLINICAL DATA:  Evaluate right hernia.  Weakness for 3 days.  EXAM: CT ABDOMEN AND PELVIS WITH CONTRAST  TECHNIQUE: Multidetector CT imaging of the abdomen and pelvis was performed using the standard protocol following bolus administration of intravenous contrast.  CONTRAST:  196mL OMNIPAQUE IOHEXOL 300 MG/ML  SOLN  COMPARISON:  None.  FINDINGS: Right basilar dependent opacity could reflect atelectasis or infiltrate. Suspect underlying COPD. No effusions. Heart is normal size.  Prior cholecystectomy. No focal hepatic abnormality. The spleen is markedly enlarged, measuring 20 cm in craniocaudal length. Pancreas, adrenals and kidneys are unremarkable. Small cysts in the lower pole of the right kidney. No hydronephrosis.  Stomach, aorta and iliac vessels are normal caliber.  No adenopathy.   No acute bony abnormality or focal bone lesion. Small bowel are unremarkable. There is a small right inguinal hernia containing fat. Fluid within the right inguinal canal as well. Small amount of free fluid in the cul-de-sac of the pelvis. There sigmoid diverticulosis. No active diverticulitis.  IMPRESSION: Marked splenomegaly with a craniocaudal length of 20 cm.  Small right inguinal hernia containing fat and fluid. Small amount of free fluid also in the cul-de-sac of the pelvis.  Right dependent basilar atelectasis or infiltrate.  Sigmoid diverticulosis.   Electronically Signed   By: Rolm Baptise M.D.   On: 03/21/2015 18:08   Dg Chest Portable 1 View  03/21/2015   CLINICAL DATA:  Shortness of breath and weakness for 3 days. Anticoagulation for atrial fibrillation.  EXAM: PORTABLE CHEST - 1 VIEW  COMPARISON:  09/17/2014  FINDINGS: A single AP portable view of the chest demonstrates no focal airspace consolidation or alveolar edema. The lungs are grossly clear. There is no large effusion or pneumothorax. Cardiac and mediastinal contours appear unremarkable.  IMPRESSION: No active disease.   Electronically Signed   By: Andreas Newport M.D.   On: 03/21/2015 17:22    Microbiology: No results found for this or any previous visit (from the past 240 hour(s)).   Labs: Basic Metabolic Panel:  Recent Labs Lab 03/31/15 1846  NA 131*  K 4.0  CL 102  CO2 20*  GLUCOSE 120*  BUN 18  CREATININE 1.21  CALCIUM 8.3*   Liver Function Tests:  Recent Labs Lab 03/31/15 1846  AST 37  ALT 17  ALKPHOS 88  BILITOT 0.9  PROT 7.7  ALBUMIN 2.6*   No results for input(s): LIPASE, AMYLASE in the last 168 hours. No results for input(s): AMMONIA in the last 168 hours. CBC:  Recent Labs Lab 03/31/15 1846  WBC 1.7*  NEUTROABS 0.8*  HGB 9.7*  HCT 29.1*  MCV 90.7  PLT 63*   Cardiac Enzymes: No results for input(s): CKTOTAL, CKMB, CKMBINDEX, TROPONINI in the last 168 hours. BNP: BNP (last 3  results)  Recent Labs  09/17/14 1900 03/21/15 1635  BNP 302.1* 272.4*    ProBNP (last 3 results) No results for input(s): PROBNP in the last 8760 hours.  CBG: No results for input(s): GLUCAP in the last 168 hours.     SignedDomenic Polite  [facility] 04/03/2015, 4:01 PM

## 2015-04-06 NOTE — Telephone Encounter (Signed)
Spoke to [place], verbal orders given for Skilled Nursing 1 x a week for 4 weeks for pt, okay per Dr.K. Margaretha Sheffield verbalized understanding.

## 2015-04-06 NOTE — Telephone Encounter (Signed)
ok 

## 2015-04-08 DIAGNOSIS — Z7901 Long term (current) use of anticoagulants: Secondary | ICD-10-CM | POA: Diagnosis not present

## 2015-04-09 ENCOUNTER — Ambulatory Visit (INDEPENDENT_AMBULATORY_CARE_PROVIDER_SITE_OTHER): Payer: Medicare Other | Admitting: *Deleted

## 2015-04-09 DIAGNOSIS — Z792 Long term (current) use of antibiotics: Secondary | ICD-10-CM | POA: Diagnosis not present

## 2015-04-09 DIAGNOSIS — N400 Enlarged prostate without lower urinary tract symptoms: Secondary | ICD-10-CM | POA: Diagnosis not present

## 2015-04-09 DIAGNOSIS — I10 Essential (primary) hypertension: Secondary | ICD-10-CM | POA: Diagnosis not present

## 2015-04-09 DIAGNOSIS — E538 Deficiency of other specified B group vitamins: Secondary | ICD-10-CM

## 2015-04-09 DIAGNOSIS — Z7901 Long term (current) use of anticoagulants: Secondary | ICD-10-CM | POA: Diagnosis not present

## 2015-04-09 DIAGNOSIS — I2510 Atherosclerotic heart disease of native coronary artery without angina pectoris: Secondary | ICD-10-CM | POA: Diagnosis not present

## 2015-04-09 DIAGNOSIS — A4101 Sepsis due to Methicillin susceptible Staphylococcus aureus: Secondary | ICD-10-CM | POA: Diagnosis not present

## 2015-04-09 DIAGNOSIS — E785 Hyperlipidemia, unspecified: Secondary | ICD-10-CM | POA: Diagnosis not present

## 2015-04-09 DIAGNOSIS — I252 Old myocardial infarction: Secondary | ICD-10-CM | POA: Diagnosis not present

## 2015-04-09 DIAGNOSIS — I4891 Unspecified atrial fibrillation: Secondary | ICD-10-CM | POA: Diagnosis not present

## 2015-04-09 DIAGNOSIS — M069 Rheumatoid arthritis, unspecified: Secondary | ICD-10-CM | POA: Diagnosis not present

## 2015-04-09 DIAGNOSIS — N390 Urinary tract infection, site not specified: Secondary | ICD-10-CM | POA: Diagnosis not present

## 2015-04-09 DIAGNOSIS — N453 Epididymo-orchitis: Secondary | ICD-10-CM | POA: Diagnosis not present

## 2015-04-09 MED ORDER — CYANOCOBALAMIN 1000 MCG/ML IJ SOLN
1000.0000 ug | Freq: Once | INTRAMUSCULAR | Status: AC
  Administered 2015-04-09: 1000 ug via INTRAMUSCULAR

## 2015-04-10 DIAGNOSIS — N453 Epididymo-orchitis: Secondary | ICD-10-CM | POA: Diagnosis not present

## 2015-04-10 DIAGNOSIS — A4101 Sepsis due to Methicillin susceptible Staphylococcus aureus: Secondary | ICD-10-CM | POA: Diagnosis not present

## 2015-04-10 DIAGNOSIS — Z792 Long term (current) use of antibiotics: Secondary | ICD-10-CM | POA: Diagnosis not present

## 2015-04-10 DIAGNOSIS — I10 Essential (primary) hypertension: Secondary | ICD-10-CM | POA: Diagnosis not present

## 2015-04-10 DIAGNOSIS — N400 Enlarged prostate without lower urinary tract symptoms: Secondary | ICD-10-CM | POA: Diagnosis not present

## 2015-04-10 DIAGNOSIS — E785 Hyperlipidemia, unspecified: Secondary | ICD-10-CM | POA: Diagnosis not present

## 2015-04-10 DIAGNOSIS — I2510 Atherosclerotic heart disease of native coronary artery without angina pectoris: Secondary | ICD-10-CM | POA: Diagnosis not present

## 2015-04-10 DIAGNOSIS — M069 Rheumatoid arthritis, unspecified: Secondary | ICD-10-CM | POA: Diagnosis not present

## 2015-04-10 DIAGNOSIS — I252 Old myocardial infarction: Secondary | ICD-10-CM | POA: Diagnosis not present

## 2015-04-10 DIAGNOSIS — N390 Urinary tract infection, site not specified: Secondary | ICD-10-CM | POA: Diagnosis not present

## 2015-04-10 DIAGNOSIS — I4891 Unspecified atrial fibrillation: Secondary | ICD-10-CM | POA: Diagnosis not present

## 2015-04-10 DIAGNOSIS — Z7901 Long term (current) use of anticoagulants: Secondary | ICD-10-CM | POA: Diagnosis not present

## 2015-04-13 ENCOUNTER — Telehealth: Payer: Self-pay | Admitting: *Deleted

## 2015-04-13 DIAGNOSIS — M069 Rheumatoid arthritis, unspecified: Secondary | ICD-10-CM | POA: Diagnosis not present

## 2015-04-13 DIAGNOSIS — N390 Urinary tract infection, site not specified: Secondary | ICD-10-CM | POA: Diagnosis not present

## 2015-04-13 DIAGNOSIS — E785 Hyperlipidemia, unspecified: Secondary | ICD-10-CM | POA: Diagnosis not present

## 2015-04-13 DIAGNOSIS — I2510 Atherosclerotic heart disease of native coronary artery without angina pectoris: Secondary | ICD-10-CM | POA: Diagnosis not present

## 2015-04-13 DIAGNOSIS — I4891 Unspecified atrial fibrillation: Secondary | ICD-10-CM | POA: Diagnosis not present

## 2015-04-13 DIAGNOSIS — N400 Enlarged prostate without lower urinary tract symptoms: Secondary | ICD-10-CM | POA: Diagnosis not present

## 2015-04-13 DIAGNOSIS — Z792 Long term (current) use of antibiotics: Secondary | ICD-10-CM | POA: Diagnosis not present

## 2015-04-13 DIAGNOSIS — Z7901 Long term (current) use of anticoagulants: Secondary | ICD-10-CM | POA: Diagnosis not present

## 2015-04-13 DIAGNOSIS — I252 Old myocardial infarction: Secondary | ICD-10-CM | POA: Diagnosis not present

## 2015-04-13 DIAGNOSIS — N453 Epididymo-orchitis: Secondary | ICD-10-CM | POA: Diagnosis not present

## 2015-04-13 DIAGNOSIS — A4101 Sepsis due to Methicillin susceptible Staphylococcus aureus: Secondary | ICD-10-CM | POA: Diagnosis not present

## 2015-04-13 DIAGNOSIS — I10 Essential (primary) hypertension: Secondary | ICD-10-CM | POA: Diagnosis not present

## 2015-04-13 NOTE — Telephone Encounter (Signed)
Please see message and advise 

## 2015-04-13 NOTE — Telephone Encounter (Signed)
Message left on my voicemail from Physical therapist Clair Gulling who saw pt today. C/o chronic diarrhea, resting heart rate 110, temp low grade fever 100.3. Clair Gulling said he told pt to increase his fluids and asked if his doctor was aware. He said pt said yes, this is on going. Clair Gulling also said he needs verbal orders to continue physical therapy for two more visits since pt missed them due to not feeling well.

## 2015-04-14 ENCOUNTER — Telehealth: Payer: Self-pay | Admitting: Internal Medicine

## 2015-04-14 DIAGNOSIS — N453 Epididymo-orchitis: Secondary | ICD-10-CM | POA: Diagnosis not present

## 2015-04-14 DIAGNOSIS — I252 Old myocardial infarction: Secondary | ICD-10-CM | POA: Diagnosis not present

## 2015-04-14 DIAGNOSIS — A4101 Sepsis due to Methicillin susceptible Staphylococcus aureus: Secondary | ICD-10-CM | POA: Diagnosis not present

## 2015-04-14 DIAGNOSIS — I10 Essential (primary) hypertension: Secondary | ICD-10-CM | POA: Diagnosis not present

## 2015-04-14 DIAGNOSIS — I2510 Atherosclerotic heart disease of native coronary artery without angina pectoris: Secondary | ICD-10-CM | POA: Diagnosis not present

## 2015-04-14 DIAGNOSIS — Z792 Long term (current) use of antibiotics: Secondary | ICD-10-CM | POA: Diagnosis not present

## 2015-04-14 DIAGNOSIS — M069 Rheumatoid arthritis, unspecified: Secondary | ICD-10-CM | POA: Diagnosis not present

## 2015-04-14 DIAGNOSIS — N390 Urinary tract infection, site not specified: Secondary | ICD-10-CM | POA: Diagnosis not present

## 2015-04-14 DIAGNOSIS — E785 Hyperlipidemia, unspecified: Secondary | ICD-10-CM | POA: Diagnosis not present

## 2015-04-14 DIAGNOSIS — Z7901 Long term (current) use of anticoagulants: Secondary | ICD-10-CM | POA: Diagnosis not present

## 2015-04-14 DIAGNOSIS — N400 Enlarged prostate without lower urinary tract symptoms: Secondary | ICD-10-CM | POA: Diagnosis not present

## 2015-04-14 DIAGNOSIS — I4891 Unspecified atrial fibrillation: Secondary | ICD-10-CM | POA: Diagnosis not present

## 2015-04-14 NOTE — Telephone Encounter (Signed)
Spoke to [employer] Physical Therapist, verbal order given for two additional visit when pt is feeling better. Okay per Dr.K. Clair Gulling verbalized understanding.

## 2015-04-14 NOTE — Telephone Encounter (Signed)
Spoke to pt, asked how he was feeling? Pt said a little better today, had fever last night. Told pt Dr.K would like to see you tomorrow. Asked pt if he can come in at 11:00 AM. Pt said yes. Told pt okay see you then. Appt scheduled.

## 2015-04-14 NOTE — Telephone Encounter (Signed)
Ok to reschedule PT after patient has improved clinically

## 2015-04-14 NOTE — Telephone Encounter (Signed)
Ophelia Shoulder call from [facility] pt had a low grade fever 100.7 woke up doing the night shirt soak and wet but temp was not recheck. Today it is 98.4  Pt reported he had bad right side hip pain on a scale from 8 to 9  Had diffculty sleeping  Elaine  [phone]

## 2015-04-14 NOTE — Telephone Encounter (Signed)
FYI

## 2015-04-14 NOTE — Telephone Encounter (Signed)
Suggest office visit tomorrow if still symptomatic

## 2015-04-15 ENCOUNTER — Encounter: Payer: Self-pay | Admitting: Internal Medicine

## 2015-04-15 ENCOUNTER — Ambulatory Visit (INDEPENDENT_AMBULATORY_CARE_PROVIDER_SITE_OTHER): Payer: Medicare Other | Admitting: Internal Medicine

## 2015-04-15 VITALS — BP 94/50 | HR 94 | Temp 97.9°F | Resp 32 | Ht 68.0 in | Wt 146.0 lb

## 2015-04-15 DIAGNOSIS — I482 Chronic atrial fibrillation, unspecified: Secondary | ICD-10-CM

## 2015-04-15 DIAGNOSIS — Z7901 Long term (current) use of anticoagulants: Secondary | ICD-10-CM | POA: Diagnosis not present

## 2015-04-15 DIAGNOSIS — M069 Rheumatoid arthritis, unspecified: Secondary | ICD-10-CM | POA: Diagnosis not present

## 2015-04-15 DIAGNOSIS — D649 Anemia, unspecified: Secondary | ICD-10-CM | POA: Diagnosis not present

## 2015-04-15 NOTE — Progress Notes (Signed)
Pre visit review using our clinic review tool, if applicable. No additional management support is needed unless otherwise documented below in the visit note. 

## 2015-04-15 NOTE — Progress Notes (Signed)
Subjective:    Patient ID: Kevin Rubio, male    DOB: September 18, 1921, 81 y.o.   MRN: [id number]  HPI  81 year old patient who is seen today in follow-up.  He has had a recent hospital discharge for a urosepsis.  He is been followed by physical therapy at home. He has had some diarrhea earlier in the week.  He states that yesterday.  This was resolved and basically he has had the one loose stool today.  He really states the diarrhea is fairly chronic that predated his recent hospital admission.  He has had some low-grade fever as high as 100 point 7.  He remains weak but generally he feels like he is improving.  Yesterday at lunch she had a shaking episode.  Apparently he had no chills associated with his sepsis syndrome.  He states the diarrhea has been present for about one year, at least intermittently.  He states that he is eating and drinking well  Admit date: 03/21/2015 Discharge date: 03/26/2015  1. Fu with Urology for Orchitis  Discharge Diagnoses:  Active Problems:  Severe sepsis  Sepsis due to urinary tract infection  Epididymitis  Orchitis  Arterial hypotension  Acute kidney injury  Chronic atrial fibrillation  CAD in native artery  Thrombocytopenia  Leukopenia  Chronic anemia  Atrial fibrillation with rapid ventricular response   Past Medical History  Diagnosis Date  . ANEMIA, PERNICIOUS 05/14/2007  . Atrial fibrillation 08/08/2007  . BENIGN PROSTATIC HYPERTROPHY 03/05/2007  . CORONARY ARTERY DISEASE 03/05/2007  . DYSPLASTIC NEVUS 01/24/2008  . HYPERLIPIDEMIA 03/05/2007  . HYPERTENSION 03/05/2007  . MYOCARDIAL INFARCTION, HX OF 03/05/2007    1982  . NEUTROPENIA NOS 05/11/2007  . Rheumatoid arthritis(714.0) 03/05/2007  . VITAMIN B12 DEFICIENCY 08/08/2007  . Current use of long term anticoagulation   . Rheumatoid arthritis(714.0)   . Hernia     umbilical    Social History   Social History  . Marital Status: Married    Spouse Name: N/A  . Number of  Children: N/A  . Years of Education: N/A   Occupational History  . Not on file.   Social History Main Topics  . Smoking status: Former Smoker    Types: Cigarettes    Quit date: 08/22/1978  . Smokeless tobacco: Never Used  . Alcohol Use: No  . Drug Use: No  . Sexual Activity: Not on file   Other Topics Concern  . Not on file   Social History Narrative    Past Surgical History  Procedure Laterality Date  . Laparoscopic cholecystectomy  11/09/2001    Dr Lennie Hummer  . Nasal polyp surgery    . Ptca  15 yrs ago  . External ear surgery  2 years ago  . Laparoscopic inguinal hernia repair  01/27/11    right direct, Dr Greer Pickerel  . Hernia repair  2012    No family history on file.  Allergies  Allergen Reactions  . Cefpodoxime Proxetil Other (See Comments)    unknown    Current Outpatient Prescriptions on File Prior to Visit  Medication Sig Dispense Refill  . acetaminophen (TYLENOL) 500 MG tablet Take 1,000 mg by mouth daily as needed for mild pain.    Marland Kitchen ascorbic Acid (VITAMIN C) 500 MG CPCR Take 500 mg by mouth daily.      . cyanocobalamin (,VITAMIN B-12,) 1000 MCG/ML injection Inject 1,000 mcg into the muscle every 30 (thirty) days.    . cycloSPORINE (RESTASIS) 0.05 % ophthalmic emulsion  Place 1 drop into both eyes 2 (two) times daily.    . Ferrous Sulfate (IRON CR PO) Take 65 mg by mouth daily.      Marland Kitchen levothyroxine (SYNTHROID, LEVOTHROID) 25 MCG tablet TAKE ONE TABLET BY MOUTH ONCE DAILY BEFORE  BREAKFAST 90 tablet 1  . metoprolol tartrate (LOPRESSOR) 25 MG tablet Take 1 tablet (25 mg total) by mouth 2 (two) times daily. 60 tablet 0  . Multiple Vitamins-Minerals (CENTRUM SILVER PO) Take 1 tablet by mouth daily.     . Omega-3 Fatty Acids (FISH OIL) 1000 MG CAPS Take 2 capsules by mouth daily.      . pravastatin (PRAVACHOL) 40 MG tablet TAKE ONE TABLET BY MOUTH ONCE DAILY 90 tablet 1  . tamsulosin (FLOMAX) 0.4 MG CAPS capsule Take 1 capsule (0.4 mg total) by mouth daily. 30  capsule 3  . warfarin (COUMADIN) 4 MG tablet Take 4-6 mg by mouth daily. 6 mg on Thurs and Sunday.  4 mg all other days.     No current facility-administered medications on file prior to visit.    BP 94/50 mmHg  Pulse 94  Temp(Src) 97.9 F (36.6 C) (Oral)  Resp 32  Ht 5\' 8"  (1.727 m)  Wt 146 lb (66.225 kg)  BMI 22.20 kg/m2  SpO2 96%     Review of Systems  Constitutional: Positive for fever and fatigue. Negative for chills and appetite change.  HENT: Negative for congestion, dental problem, ear pain, hearing loss, sore throat, tinnitus, trouble swallowing and voice change.   Eyes: Negative for pain, discharge and visual disturbance.  Respiratory: Negative for cough, chest tightness, wheezing and stridor.   Cardiovascular: Negative for chest pain, palpitations and leg swelling.  Gastrointestinal: Positive for diarrhea. Negative for nausea, vomiting, abdominal pain, constipation, blood in stool and abdominal distention.  Genitourinary: Negative for urgency, hematuria, flank pain, discharge, difficulty urinating and genital sores.  Musculoskeletal: Negative for myalgias, back pain, joint swelling, arthralgias, gait problem and neck stiffness.  Skin: Negative for rash.  Neurological: Positive for weakness. Negative for dizziness, syncope, speech difficulty, numbness and headaches.  Hematological: Negative for adenopathy. Does not bruise/bleed easily.  Psychiatric/Behavioral: Negative for behavioral problems and dysphoric mood. The patient is not nervous/anxious.        Objective:   Physical Exam  Constitutional: He is oriented to person, place, and time. He appears well-developed. No distress.  Temperature 97.9 Blood pressure 100/50 Pulse 90  HENT:  Head: Normocephalic.  Right Ear: External ear normal.  Left Ear: External ear normal.  Eyes: Conjunctivae and EOM are normal.  Neck: Normal range of motion.  Cardiovascular: Normal rate and normal heart sounds.     Pulmonary/Chest: Effort normal and breath sounds normal.  Abdominal: Bowel sounds are normal. He exhibits no distension. There is no tenderness. There is no rebound and no guarding.  Musculoskeletal: Normal range of motion. He exhibits no edema or tenderness.  Neurological: He is alert and oriented to person, place, and time.  Psychiatric: He has a normal mood and affect. His behavior is normal.          Assessment & Plan:   Status post sepsis syndrome Intermittent low-grade fever Intermittent diarrhea.  This appears to be chronic.  Will observe at this point.  Low suspicion for C. difficile colitis Neutropenia  No change in medical regimen Will force fluids and follow closely clinically  Laboratory update

## 2015-04-15 NOTE — Patient Instructions (Signed)
Drink as much fluid as you  can tolerate over the next few days  Report any worsening weakness, fever or chills  Return in one month for follow-up

## 2015-04-16 ENCOUNTER — Telehealth: Payer: Self-pay | Admitting: Internal Medicine

## 2015-04-16 DIAGNOSIS — M069 Rheumatoid arthritis, unspecified: Secondary | ICD-10-CM | POA: Diagnosis not present

## 2015-04-16 DIAGNOSIS — N453 Epididymo-orchitis: Secondary | ICD-10-CM | POA: Diagnosis not present

## 2015-04-16 DIAGNOSIS — N400 Enlarged prostate without lower urinary tract symptoms: Secondary | ICD-10-CM | POA: Diagnosis not present

## 2015-04-16 DIAGNOSIS — Z792 Long term (current) use of antibiotics: Secondary | ICD-10-CM | POA: Diagnosis not present

## 2015-04-16 DIAGNOSIS — I252 Old myocardial infarction: Secondary | ICD-10-CM | POA: Diagnosis not present

## 2015-04-16 DIAGNOSIS — I10 Essential (primary) hypertension: Secondary | ICD-10-CM | POA: Diagnosis not present

## 2015-04-16 DIAGNOSIS — E785 Hyperlipidemia, unspecified: Secondary | ICD-10-CM | POA: Diagnosis not present

## 2015-04-16 DIAGNOSIS — Z7901 Long term (current) use of anticoagulants: Secondary | ICD-10-CM | POA: Diagnosis not present

## 2015-04-16 DIAGNOSIS — I2510 Atherosclerotic heart disease of native coronary artery without angina pectoris: Secondary | ICD-10-CM | POA: Diagnosis not present

## 2015-04-16 DIAGNOSIS — A4101 Sepsis due to Methicillin susceptible Staphylococcus aureus: Secondary | ICD-10-CM | POA: Diagnosis not present

## 2015-04-16 DIAGNOSIS — N390 Urinary tract infection, site not specified: Secondary | ICD-10-CM | POA: Diagnosis not present

## 2015-04-16 DIAGNOSIS — I4891 Unspecified atrial fibrillation: Secondary | ICD-10-CM | POA: Diagnosis not present

## 2015-04-16 NOTE — Telephone Encounter (Signed)
FYI

## 2015-04-16 NOTE — Telephone Encounter (Signed)
Herbert Deaner called to let us know that the patient reported a fall last night whine in the shower. Required neighbor to get him off floor. New bruises on arms and hips. Patient reported legs became weak and unable to stand. Increase of fatigue. Patient hit head, no signed of trauma on head. Patients stats: seated 86/50 pulse in 90's temp 100.1 patient denies short of breath, chest pain or headache. Heart rate irregular.

## 2015-04-20 ENCOUNTER — Ambulatory Visit (INDEPENDENT_AMBULATORY_CARE_PROVIDER_SITE_OTHER): Payer: Medicare Other | Admitting: Internal Medicine

## 2015-04-20 ENCOUNTER — Other Ambulatory Visit: Payer: Self-pay | Admitting: Internal Medicine

## 2015-04-20 ENCOUNTER — Encounter: Payer: Self-pay | Admitting: Internal Medicine

## 2015-04-20 VITALS — BP 100/60 | HR 96 | Temp 97.8°F | Resp 32

## 2015-04-20 DIAGNOSIS — E039 Hypothyroidism, unspecified: Secondary | ICD-10-CM

## 2015-04-20 DIAGNOSIS — A419 Sepsis, unspecified organism: Secondary | ICD-10-CM

## 2015-04-20 DIAGNOSIS — I482 Chronic atrial fibrillation, unspecified: Secondary | ICD-10-CM

## 2015-04-20 DIAGNOSIS — R5383 Other fatigue: Secondary | ICD-10-CM

## 2015-04-20 DIAGNOSIS — N390 Urinary tract infection, site not specified: Secondary | ICD-10-CM | POA: Diagnosis not present

## 2015-04-20 DIAGNOSIS — R296 Repeated falls: Secondary | ICD-10-CM

## 2015-04-20 DIAGNOSIS — D696 Thrombocytopenia, unspecified: Secondary | ICD-10-CM | POA: Diagnosis not present

## 2015-04-20 DIAGNOSIS — D649 Anemia, unspecified: Secondary | ICD-10-CM | POA: Diagnosis not present

## 2015-04-20 DIAGNOSIS — R29898 Other symptoms and signs involving the musculoskeletal system: Secondary | ICD-10-CM | POA: Diagnosis not present

## 2015-04-20 LAB — COMPREHENSIVE METABOLIC PANEL
ALK PHOS: 110 U/L (ref 39–117)
ALT: 19 U/L (ref 0–53)
AST: 23 U/L (ref 0–37)
Albumin: 2.4 g/dL — ABNORMAL LOW (ref 3.5–5.2)
BUN: 21 mg/dL (ref 6–23)
CHLORIDE: 103 meq/L (ref 96–112)
CO2: 23 mEq/L (ref 19–32)
CREATININE: 0.97 mg/dL (ref 0.40–1.50)
Calcium: 8.2 mg/dL — ABNORMAL LOW (ref 8.4–10.5)
GFR: 77.09 mL/min (ref >60.00)
GLUCOSE: 107 mg/dL — AB (ref 70–99)
POTASSIUM: 4 meq/L (ref 3.5–5.1)
SODIUM: 132 meq/L — AB (ref 135–145)
TOTAL PROTEIN: 7.5 g/dL (ref 6.0–8.3)
Total Bilirubin: 0.8 mg/dL (ref 0.2–1.2)

## 2015-04-20 LAB — TSH: TSH: 7.97 u[IU]/mL — ABNORMAL HIGH (ref 0.35–4.50)

## 2015-04-20 LAB — CBC WITH DIFFERENTIAL/PLATELET
BASOS PCT: 0.1 % (ref 0.0–3.0)
Basophils Absolute: 0 10*3/uL (ref 0.0–0.1)
EOS PCT: 0.1 % (ref 0.0–5.0)
Eosinophils Absolute: 0 10*3/uL (ref 0.0–0.7)
HCT: 32.6 % — ABNORMAL LOW (ref 39.0–52.0)
Hemoglobin: 10.7 g/dL — ABNORMAL LOW (ref 13.0–17.0)
LYMPHS ABS: 0.2 10*3/uL — AB (ref 0.7–4.0)
Lymphocytes Relative: 9.6 % — ABNORMAL LOW (ref 12.0–46.0)
MCHC: 32.7 g/dL (ref 30.0–36.0)
MCV: 94.4 fl (ref 78.0–100.0)
MONO ABS: 0.2 10*3/uL (ref 0.1–1.0)
Monocytes Relative: 9.8 % (ref 3.0–12.0)
Neutro Abs: 1.9 10*3/uL (ref 1.4–7.7)
Platelets: 113 10*3/uL — ABNORMAL LOW (ref 150.0–400.0)
RBC: 3.45 Mil/uL — ABNORMAL LOW (ref 4.22–5.81)
RDW: 19.3 % — AB (ref 11.5–15.5)
WBC: 2.4 10*3/uL — ABNORMAL LOW (ref 4.0–10.5)

## 2015-04-20 MED ORDER — CIPROFLOXACIN HCL 500 MG PO TABS
500.0000 mg | ORAL_TABLET | Freq: Two times a day (BID) | ORAL | Status: DC
Start: 2015-04-20 — End: 2015-04-21

## 2015-04-20 NOTE — Patient Instructions (Signed)
Hold Coumadin for 7 days while you are taking ciprofloxacin  Limit your sodium (Salt) intake  Continue physical therapy

## 2015-04-20 NOTE — Progress Notes (Signed)
Subjective:    Patient ID: Kevin Rubio, male    DOB: 1921/09/13, 81 y.o.   MRN: [id number]  HPI  81 year old patient who has had a fairly recent hospitalization for urosepsis and weakness.  He was seen last week in follow-up following this hospital admission.  At that time he complained of some intermittent low-grade fever as well as some episodic loose stools.  His bowel habits a fairly normal, although occur 2-3 times per day.  He continues to have some occasional low-grade fever.  His main complaint is weakness and frequent falls.  He states his left leg, especially just gives out and he slumps to the floor.  There is been no significant trauma.  There is no syncope or presyncopal symptoms.  He continues to have physical therapy performed at home  Past Medical History  Diagnosis Date  . ANEMIA, PERNICIOUS 05/14/2007  . Atrial fibrillation 08/08/2007  . BENIGN PROSTATIC HYPERTROPHY 03/05/2007  . CORONARY ARTERY DISEASE 03/05/2007  . DYSPLASTIC NEVUS 01/24/2008  . HYPERLIPIDEMIA 03/05/2007  . HYPERTENSION 03/05/2007  . MYOCARDIAL INFARCTION, HX OF 03/05/2007    1982  . NEUTROPENIA NOS 05/11/2007  . Rheumatoid arthritis(714.0) 03/05/2007  . VITAMIN B12 DEFICIENCY 08/08/2007  . Current use of long term anticoagulation   . Rheumatoid arthritis(714.0)   . Hernia     umbilical    Social History   Social History  . Marital Status: Married    Spouse Name: N/A  . Number of Children: N/A  . Years of Education: N/A   Occupational History  . Not on file.   Social History Main Topics  . Smoking status: Former Smoker    Types: Cigarettes    Quit date: 08/22/1978  . Smokeless tobacco: Never Used  . Alcohol Use: No  . Drug Use: No  . Sexual Activity: Not on file   Other Topics Concern  . Not on file   Social History Narrative    Past Surgical History  Procedure Laterality Date  . Laparoscopic cholecystectomy  11/09/2001    Dr Lennie Hummer  . Nasal polyp surgery    . Ptca  15 yrs  ago  . External ear surgery  2 years ago  . Laparoscopic inguinal hernia repair  01/27/11    right direct, Dr Greer Pickerel  . Hernia repair  2012    No family history on file.  Allergies  Allergen Reactions  . Cefpodoxime Proxetil Other (See Comments)    unknown    Current Outpatient Prescriptions on File Prior to Visit  Medication Sig Dispense Refill  . acetaminophen (TYLENOL) 500 MG tablet Take 1,000 mg by mouth daily as needed for mild pain.    Marland Kitchen ascorbic Acid (VITAMIN C) 500 MG CPCR Take 500 mg by mouth daily.      . cyanocobalamin (,VITAMIN B-12,) 1000 MCG/ML injection Inject 1,000 mcg into the muscle every 30 (thirty) days.    . cycloSPORINE (RESTASIS) 0.05 % ophthalmic emulsion Place 1 drop into both eyes 2 (two) times daily.    . Ferrous Sulfate (IRON CR PO) Take 65 mg by mouth daily.      Marland Kitchen levothyroxine (SYNTHROID, LEVOTHROID) 25 MCG tablet TAKE ONE TABLET BY MOUTH ONCE DAILY BEFORE  BREAKFAST 90 tablet 1  . metoprolol tartrate (LOPRESSOR) 25 MG tablet Take 1 tablet (25 mg total) by mouth 2 (two) times daily. 60 tablet 0  . Multiple Vitamins-Minerals (CENTRUM SILVER PO) Take 1 tablet by mouth daily.     . Omega-3  Fatty Acids (FISH OIL) 1000 MG CAPS Take 2 capsules by mouth daily.      . pravastatin (PRAVACHOL) 40 MG tablet TAKE ONE TABLET BY MOUTH ONCE DAILY 90 tablet 3  . tamsulosin (FLOMAX) 0.4 MG CAPS capsule Take 1 capsule (0.4 mg total) by mouth daily. 30 capsule 3  . warfarin (COUMADIN) 4 MG tablet Take 4-6 mg by mouth daily. 6 mg on Thurs and Sunday.  4 mg all other days.     No current facility-administered medications on file prior to visit.    BP 100/60 mmHg  Pulse 96  Temp(Src) 97.8 F (36.6 C) (Oral)  Resp 32  Wt   SpO2 97%     Review of Systems  Constitutional: Positive for fever, activity change and fatigue. Negative for chills and appetite change.  HENT: Negative for congestion, dental problem, ear pain, hearing loss, sore throat, tinnitus, trouble  swallowing and voice change.   Eyes: Negative for pain, discharge and visual disturbance.  Respiratory: Negative for cough, chest tightness, wheezing and stridor.   Cardiovascular: Negative for chest pain, palpitations and leg swelling.  Gastrointestinal: Negative for nausea, vomiting, abdominal pain, diarrhea, constipation, blood in stool and abdominal distention.  Genitourinary: Negative for urgency, hematuria, flank pain, discharge, difficulty urinating and genital sores.  Musculoskeletal: Negative for myalgias, back pain, joint swelling, arthralgias, gait problem and neck stiffness.  Skin: Negative for rash.  Neurological: Positive for weakness. Negative for dizziness, syncope, speech difficulty, numbness and headaches.  Hematological: Negative for adenopathy. Does not bruise/bleed easily.  Psychiatric/Behavioral: Negative for behavioral problems and dysphoric mood. The patient is not nervous/anxious.        Objective:   Physical Exam  Constitutional: He is oriented to person, place, and time. He appears well-developed.  Elderly, frail, no distress Alert and able to give a coherent history Blood pressure 100/60 Mild resting tachypnea  HENT:  Head: Normocephalic.  Right Ear: External ear normal.  Left Ear: External ear normal.  Eyes: Conjunctivae and EOM are normal.  Neck: Normal range of motion.  Cardiovascular: Normal rate and normal heart sounds.   Pulse irregular and 90-95  Pulmonary/Chest:  Mild resting tachypnea Diminished breath sounds at both bases  Abdominal: Bowel sounds are normal.  Musculoskeletal: Normal range of motion. He exhibits edema. He exhibits no tenderness.  Neurological: He is alert and oriented to person, place, and time.  Skin:  Scattered ecchymoses  Psychiatric: He has a normal mood and affect. His behavior is normal.          Assessment & Plan:   Chronic atrial fibrillation.  Patient is a very high fall risk, need to consider the  risk-benefit ratio of chronic anticoagulation, especially in view of his thrombocytopenia Coronary artery disease Weakness  We'll check updated lab Continue physical therapy  History of urosepsis for persistent low-grade fever.  We'll attempt to obtain a urine C&S and treat with Cipro for 7 days

## 2015-04-20 NOTE — Progress Notes (Signed)
Pre visit review using our clinic review tool, if applicable. No additional management support is needed unless otherwise documented below in the visit note. 

## 2015-04-21 ENCOUNTER — Telehealth: Payer: Self-pay | Admitting: *Deleted

## 2015-04-21 ENCOUNTER — Emergency Department (HOSPITAL_COMMUNITY): Payer: Medicare Other

## 2015-04-21 ENCOUNTER — Encounter (HOSPITAL_COMMUNITY): Payer: Self-pay | Admitting: Emergency Medicine

## 2015-04-21 ENCOUNTER — Telehealth: Payer: Self-pay | Admitting: Internal Medicine

## 2015-04-21 ENCOUNTER — Observation Stay (HOSPITAL_COMMUNITY)
Admission: EM | Admit: 2015-04-21 | Discharge: 2015-04-26 | Disposition: A | Discharge status: Home / Self Care | Payer: Medicare Other | Attending: Internal Medicine | Admitting: Internal Medicine

## 2015-04-21 DIAGNOSIS — M069 Rheumatoid arthritis, unspecified: Secondary | ICD-10-CM | POA: Diagnosis not present

## 2015-04-21 DIAGNOSIS — E538 Deficiency of other specified B group vitamins: Secondary | ICD-10-CM | POA: Insufficient documentation

## 2015-04-21 DIAGNOSIS — Z7901 Long term (current) use of anticoagulants: Secondary | ICD-10-CM | POA: Diagnosis not present

## 2015-04-21 DIAGNOSIS — N400 Enlarged prostate without lower urinary tract symptoms: Secondary | ICD-10-CM | POA: Insufficient documentation

## 2015-04-21 DIAGNOSIS — N390 Urinary tract infection, site not specified: Secondary | ICD-10-CM | POA: Diagnosis not present

## 2015-04-21 DIAGNOSIS — R58 Hemorrhage, not elsewhere classified: Secondary | ICD-10-CM | POA: Diagnosis not present

## 2015-04-21 DIAGNOSIS — I2510 Atherosclerotic heart disease of native coronary artery without angina pectoris: Secondary | ICD-10-CM | POA: Insufficient documentation

## 2015-04-21 DIAGNOSIS — R161 Splenomegaly, not elsewhere classified: Secondary | ICD-10-CM | POA: Diagnosis not present

## 2015-04-21 DIAGNOSIS — D709 Neutropenia, unspecified: Secondary | ICD-10-CM

## 2015-04-21 DIAGNOSIS — R062 Wheezing: Secondary | ICD-10-CM

## 2015-04-21 DIAGNOSIS — I482 Chronic atrial fibrillation: Secondary | ICD-10-CM

## 2015-04-21 DIAGNOSIS — W1839XA Other fall on same level, initial encounter: Secondary | ICD-10-CM | POA: Diagnosis not present

## 2015-04-21 DIAGNOSIS — I959 Hypotension, unspecified: Secondary | ICD-10-CM | POA: Insufficient documentation

## 2015-04-21 DIAGNOSIS — Z79899 Other long term (current) drug therapy: Secondary | ICD-10-CM | POA: Insufficient documentation

## 2015-04-21 DIAGNOSIS — Z515 Encounter for palliative care: Secondary | ICD-10-CM | POA: Insufficient documentation

## 2015-04-21 DIAGNOSIS — R0602 Shortness of breath: Secondary | ICD-10-CM

## 2015-04-21 DIAGNOSIS — Z8701 Personal history of pneumonia (recurrent): Secondary | ICD-10-CM | POA: Diagnosis not present

## 2015-04-21 DIAGNOSIS — Z792 Long term (current) use of antibiotics: Secondary | ICD-10-CM | POA: Diagnosis not present

## 2015-04-21 DIAGNOSIS — Z87891 Personal history of nicotine dependence: Secondary | ICD-10-CM | POA: Insufficient documentation

## 2015-04-21 DIAGNOSIS — R509 Fever, unspecified: Secondary | ICD-10-CM | POA: Diagnosis not present

## 2015-04-21 DIAGNOSIS — Y92009 Unspecified place in unspecified non-institutional (private) residence as the place of occurrence of the external cause: Secondary | ICD-10-CM | POA: Diagnosis not present

## 2015-04-21 DIAGNOSIS — A4101 Sepsis due to Methicillin susceptible Staphylococcus aureus: Secondary | ICD-10-CM | POA: Diagnosis not present

## 2015-04-21 DIAGNOSIS — I10 Essential (primary) hypertension: Secondary | ICD-10-CM | POA: Diagnosis not present

## 2015-04-21 DIAGNOSIS — E785 Hyperlipidemia, unspecified: Secondary | ICD-10-CM | POA: Diagnosis present

## 2015-04-21 DIAGNOSIS — S3991XA Unspecified injury of abdomen, initial encounter: Principal | ICD-10-CM | POA: Insufficient documentation

## 2015-04-21 DIAGNOSIS — D696 Thrombocytopenia, unspecified: Secondary | ICD-10-CM | POA: Diagnosis not present

## 2015-04-21 DIAGNOSIS — K469 Unspecified abdominal hernia without obstruction or gangrene: Secondary | ICD-10-CM | POA: Diagnosis not present

## 2015-04-21 DIAGNOSIS — K219 Gastro-esophageal reflux disease without esophagitis: Secondary | ICD-10-CM | POA: Diagnosis not present

## 2015-04-21 DIAGNOSIS — Y9389 Activity, other specified: Secondary | ICD-10-CM | POA: Insufficient documentation

## 2015-04-21 DIAGNOSIS — W19XXXA Unspecified fall, initial encounter: Secondary | ICD-10-CM | POA: Diagnosis not present

## 2015-04-21 DIAGNOSIS — J90 Pleural effusion, not elsewhere classified: Secondary | ICD-10-CM

## 2015-04-21 DIAGNOSIS — D68318 Other hemorrhagic disorder due to intrinsic circulating anticoagulants, antibodies, or inhibitors: Secondary | ICD-10-CM | POA: Diagnosis not present

## 2015-04-21 DIAGNOSIS — I252 Old myocardial infarction: Secondary | ICD-10-CM | POA: Insufficient documentation

## 2015-04-21 DIAGNOSIS — R531 Weakness: Secondary | ICD-10-CM | POA: Diagnosis not present

## 2015-04-21 DIAGNOSIS — I480 Paroxysmal atrial fibrillation: Secondary | ICD-10-CM

## 2015-04-21 DIAGNOSIS — R109 Unspecified abdominal pain: Secondary | ICD-10-CM | POA: Diagnosis not present

## 2015-04-21 DIAGNOSIS — Y998 Other external cause status: Secondary | ICD-10-CM | POA: Insufficient documentation

## 2015-04-21 DIAGNOSIS — D649 Anemia, unspecified: Secondary | ICD-10-CM | POA: Diagnosis not present

## 2015-04-21 DIAGNOSIS — E039 Hypothyroidism, unspecified: Secondary | ICD-10-CM | POA: Diagnosis present

## 2015-04-21 DIAGNOSIS — I4891 Unspecified atrial fibrillation: Secondary | ICD-10-CM | POA: Diagnosis present

## 2015-04-21 DIAGNOSIS — R05 Cough: Secondary | ICD-10-CM

## 2015-04-21 DIAGNOSIS — S301XXA Contusion of abdominal wall, initial encounter: Secondary | ICD-10-CM | POA: Diagnosis present

## 2015-04-21 DIAGNOSIS — R404 Transient alteration of awareness: Secondary | ICD-10-CM | POA: Diagnosis not present

## 2015-04-21 DIAGNOSIS — N453 Epididymo-orchitis: Secondary | ICD-10-CM | POA: Diagnosis not present

## 2015-04-21 DIAGNOSIS — D61818 Other pancytopenia: Secondary | ICD-10-CM

## 2015-04-21 DIAGNOSIS — R059 Cough, unspecified: Secondary | ICD-10-CM

## 2015-04-21 HISTORY — DX: Acute myocardial infarction, unspecified: I21.9

## 2015-04-21 HISTORY — DX: Angina pectoris, unspecified: I20.9

## 2015-04-21 HISTORY — DX: Hypotension, unspecified: I95.9

## 2015-04-21 HISTORY — DX: Unspecified malignant neoplasm of skin of left ear and external auricular canal: C44.209

## 2015-04-21 HISTORY — DX: Gastro-esophageal reflux disease without esophagitis: K21.9

## 2015-04-21 HISTORY — DX: Pneumonia, unspecified organism: J18.9

## 2015-04-21 HISTORY — DX: Hypothyroidism, unspecified: E03.9

## 2015-04-21 LAB — PROTIME-INR
INR: 7.88 — AB (ref 0.00–1.49)
INR: 1.98 — AB (ref 0.00–1.49)
PROTHROMBIN TIME: 63.3 s — AB (ref 11.6–15.2)
Prothrombin Time: 22.4 seconds — ABNORMAL HIGH (ref 11.6–15.2)

## 2015-04-21 LAB — CBC WITH DIFFERENTIAL/PLATELET
BASOS ABS: 0 10*3/uL (ref 0.0–0.1)
Basophils Relative: 0 % (ref 0–1)
EOS ABS: 0 10*3/uL (ref 0.0–0.7)
Eosinophils Relative: 1 % (ref 0–5)
HCT: 30 % — ABNORMAL LOW (ref 39.0–52.0)
HEMOGLOBIN: 10 g/dL — AB (ref 13.0–17.0)
LYMPHS PCT: 16 % (ref 12–46)
Lymphs Abs: 0.2 10*3/uL — ABNORMAL LOW (ref 0.7–4.0)
MCH: 31.4 pg (ref 26.0–34.0)
MCHC: 33.3 g/dL (ref 30.0–36.0)
MCV: 94.3 fL (ref 78.0–100.0)
MONO ABS: 0.2 10*3/uL (ref 0.1–1.0)
Monocytes Relative: 12 % (ref 3–12)
NEUTROS ABS: 1.1 10*3/uL — AB (ref 1.7–7.7)
NEUTROS PCT: 71 % (ref 43–77)
PLATELETS: 90 10*3/uL — AB (ref 150–400)
RBC: 3.18 MIL/uL — ABNORMAL LOW (ref 4.22–5.81)
RDW: 18.1 % — ABNORMAL HIGH (ref 11.5–15.5)
WBC: 1.5 10*3/uL — ABNORMAL LOW (ref 4.0–10.5)

## 2015-04-21 LAB — URINALYSIS, ROUTINE W REFLEX MICROSCOPIC
Bilirubin Urine: NEGATIVE
GLUCOSE, UA: NEGATIVE mg/dL
Hgb urine dipstick: NEGATIVE
KETONES UR: NEGATIVE mg/dL
LEUKOCYTES UA: NEGATIVE
NITRITE: NEGATIVE
PROTEIN: NEGATIVE mg/dL
Specific Gravity, Urine: 1.019 (ref 1.005–1.030)
UROBILINOGEN UA: 1 mg/dL (ref 0.0–1.0)
pH: 5 (ref 5.0–8.0)

## 2015-04-21 LAB — COMPREHENSIVE METABOLIC PANEL
ALK PHOS: 99 U/L (ref 38–126)
ALT: 18 U/L (ref 17–63)
ANION GAP: 5 (ref 5–15)
AST: 31 U/L (ref 15–41)
Albumin: 1.8 g/dL — ABNORMAL LOW (ref 3.5–5.0)
BILIRUBIN TOTAL: 0.7 mg/dL (ref 0.3–1.2)
BUN: 17 mg/dL (ref 6–20)
CALCIUM: 7.8 mg/dL — AB (ref 8.9–10.3)
CO2: 21 mmol/L — ABNORMAL LOW (ref 22–32)
CREATININE: 1.01 mg/dL (ref 0.61–1.24)
Chloride: 105 mmol/L (ref 101–111)
GFR calc non Af Amer: >60 mL/min (ref >60)
GLUCOSE: 109 mg/dL — AB (ref 65–99)
Potassium: 4.1 mmol/L (ref 3.5–5.1)
Sodium: 131 mmol/L — ABNORMAL LOW (ref 135–145)
TOTAL PROTEIN: 6.3 g/dL — AB (ref 6.5–8.1)

## 2015-04-21 LAB — I-STAT CG4 LACTIC ACID, ED
LACTIC ACID, VENOUS: 2.01 mmol/L — AB (ref 0.5–2.0)
LACTIC ACID, VENOUS: 1.27 mmol/L (ref 0.5–2.0)

## 2015-04-21 LAB — HEMOGLOBIN AND HEMATOCRIT, BLOOD
HEMATOCRIT: 26.5 % — AB (ref 39.0–52.0)
HEMOGLOBIN: 8.5 g/dL — AB (ref 13.0–17.0)

## 2015-04-21 MED ORDER — ACETAMINOPHEN 325 MG PO TABS
650.0000 mg | ORAL_TABLET | Freq: Four times a day (QID) | ORAL | Status: DC | PRN
  Administered 2015-04-25: 650 mg via ORAL
  Filled 2015-04-21: qty 2

## 2015-04-21 MED ORDER — METOPROLOL TARTRATE 25 MG PO TABS
25.0000 mg | ORAL_TABLET | Freq: Two times a day (BID) | ORAL | Status: DC
Start: 2015-04-21 — End: 2015-04-22
  Administered 2015-04-21: 25 mg via ORAL
  Filled 2015-04-21 (×3): qty 1

## 2015-04-21 MED ORDER — MORPHINE SULFATE (PF) 2 MG/ML IV SOLN: 2.0000 mg | INTRAVENOUS | Status: DC | PRN

## 2015-04-21 MED ORDER — SODIUM CHLORIDE 0.9 % IV SOLN: 10.0000 mL/h | Freq: Once | INTRAVENOUS | Status: DC

## 2015-04-21 MED ORDER — SODIUM CHLORIDE 0.9 % IJ SOLN
3.0000 mL | Freq: Two times a day (BID) | INTRAMUSCULAR | Status: DC
  Administered 2015-04-21 – 2015-04-26 (×7): 3 mL via INTRAVENOUS

## 2015-04-21 MED ORDER — SODIUM CHLORIDE 0.9 % IV SOLN: 250.0000 mL | INTRAVENOUS | Status: DC | PRN

## 2015-04-21 MED ORDER — LEVOTHYROXINE SODIUM 25 MCG PO TABS
25.0000 ug | ORAL_TABLET | Freq: Every day | ORAL | Status: DC
  Administered 2015-04-22 – 2015-04-26 (×5): 25 ug via ORAL
  Filled 2015-04-21 (×6): qty 1

## 2015-04-21 MED ORDER — IOHEXOL 300 MG/ML  SOLN
100.0000 mL | Freq: Once | INTRAMUSCULAR | Status: AC | PRN
  Administered 2015-04-21: 100 mL via INTRAVENOUS

## 2015-04-21 MED ORDER — VITAMIN C 500 MG PO TABS
500.0000 mg | ORAL_TABLET | Freq: Every day | ORAL | Status: DC
  Administered 2015-04-22 – 2015-04-26 (×5): 500 mg via ORAL
  Filled 2015-04-21 (×5): qty 1

## 2015-04-21 MED ORDER — SODIUM CHLORIDE 0.9 % IJ SOLN
3.0000 mL | Freq: Two times a day (BID) | INTRAMUSCULAR | Status: DC
  Administered 2015-04-21 – 2015-04-25 (×7): 3 mL via INTRAVENOUS

## 2015-04-21 MED ORDER — IOHEXOL 300 MG/ML  SOLN: 25.0000 mL | Freq: Once | INTRAMUSCULAR | Status: DC | PRN

## 2015-04-21 MED ORDER — ADULT MULTIVITAMIN W/MINERALS CH
1.0000 | ORAL_TABLET | Freq: Every day | ORAL | Status: DC
  Administered 2015-04-22 – 2015-04-26 (×5): 1 via ORAL
  Filled 2015-04-21 (×5): qty 1

## 2015-04-21 MED ORDER — ACETAMINOPHEN 650 MG RE SUPP: 650.0000 mg | Freq: Four times a day (QID) | RECTAL | Status: DC | PRN

## 2015-04-21 MED ORDER — SODIUM CHLORIDE 0.9 % IV SOLN
1000.0000 mL | Freq: Once | INTRAVENOUS | Status: AC
  Administered 2015-04-21: 1000 mL via INTRAVENOUS

## 2015-04-21 MED ORDER — CYCLOSPORINE 0.05 % OP EMUL
1.0000 [drp] | Freq: Two times a day (BID) | OPHTHALMIC | Status: DC
  Administered 2015-04-21 – 2015-04-26 (×10): 1 [drp] via OPHTHALMIC
  Filled 2015-04-21 (×11): qty 1

## 2015-04-21 MED ORDER — PRAVASTATIN SODIUM 40 MG PO TABS
40.0000 mg | ORAL_TABLET | Freq: Every day | ORAL | Status: DC
  Administered 2015-04-22 – 2015-04-25 (×4): 40 mg via ORAL
  Filled 2015-04-21 (×4): qty 1

## 2015-04-21 MED ORDER — CYANOCOBALAMIN 1000 MCG/ML IJ SOLN
1000.0000 ug | INTRAMUSCULAR | Status: DC
  Administered 2015-04-23: 1000 ug via INTRAMUSCULAR
  Filled 2015-04-21: qty 1

## 2015-04-21 MED ORDER — SODIUM CHLORIDE 0.9 % IJ SOLN
3.0000 mL | INTRAMUSCULAR | Status: DC | PRN
  Administered 2015-04-24: 3 mL via INTRAVENOUS
  Filled 2015-04-21: qty 3

## 2015-04-21 MED ORDER — OMEGA-3-ACID ETHYL ESTERS 1 G PO CAPS
2.0000 | ORAL_CAPSULE | Freq: Every day | ORAL | Status: DC
  Administered 2015-04-22 – 2015-04-26 (×5): 2 g via ORAL
  Filled 2015-04-21 (×5): qty 2

## 2015-04-21 MED ORDER — SODIUM CHLORIDE 0.9 % IV SOLN
1000.0000 mL | INTRAVENOUS | Status: DC
  Administered 2015-04-21 – 2015-04-22 (×4): 1000 mL via INTRAVENOUS

## 2015-04-21 MED ORDER — TAMSULOSIN HCL 0.4 MG PO CAPS
0.4000 mg | ORAL_CAPSULE | Freq: Every day | ORAL | Status: DC
  Administered 2015-04-22 – 2015-04-26 (×5): 0.4 mg via ORAL
  Filled 2015-04-21 (×5): qty 1

## 2015-04-21 MED ORDER — SODIUM CHLORIDE 0.9 % IV BOLUS (SEPSIS)
1000.0000 mL | Freq: Once | INTRAVENOUS | Status: AC
  Administered 2015-04-21: 1000 mL via INTRAVENOUS

## 2015-04-21 MED ORDER — VITAMIN K1 10 MG/ML IJ SOLN
10.0000 mg | Freq: Once | INTRAMUSCULAR | Status: AC
  Administered 2015-04-21: 10 mg via INTRAVENOUS
  Filled 2015-04-21: qty 1

## 2015-04-21 NOTE — Telephone Encounter (Signed)
Patient went to the hospital.

## 2015-04-21 NOTE — ED Notes (Signed)
Lab at bedside

## 2015-04-21 NOTE — Consult Note (Signed)
Reason for Consult:possible abdominal hemorrhage after fall Referring Physician: Shawna Clamp is an 81 y.o. male.  HPI: Jaiveer takes coumadin for a history of a fib. Over the past 7 days he has had a series of falls with increasing frequency. His wife describes his L hip area bothering him and "giving out on him". After another fall this a.m., they called EMS. He was evaluated that the house and not transported. His primary care physician is Dr.Kwiatkowski. They called his office number directed to the emergency department. His cardiologist is Dr. Wynonia Lawman. He continues to complain of some left hip and abdominal pain. He is very hard of hearing and does not have his hearing aids at this time so his family assists with history. He was found in the emergency department to have an INR of 7.88. I was asked to see him for concern for intra-abdominal hemorrhage. He is known to our practice status post lap cholecystectomy many years ago by Dr. Rosana Hoes and laparoscopic right inguinal hernia repair in 2012 by Dr. Redmond Pulling.  Past Medical History  Diagnosis Date  . ANEMIA, PERNICIOUS 05/14/2007  . Atrial fibrillation 08/08/2007  . BENIGN PROSTATIC HYPERTROPHY 03/05/2007  . CORONARY ARTERY DISEASE 03/05/2007  . DYSPLASTIC NEVUS 01/24/2008  . HYPERLIPIDEMIA 03/05/2007  . HYPERTENSION 03/05/2007  . MYOCARDIAL INFARCTION, HX OF 03/05/2007    1982  . NEUTROPENIA NOS 05/11/2007  . Rheumatoid arthritis(714.0) 03/05/2007  . VITAMIN B12 DEFICIENCY 08/08/2007  . Current use of long term anticoagulation   . Rheumatoid arthritis(714.0)   . Hernia     umbilical    Past Surgical History  Procedure Laterality Date  . Laparoscopic cholecystectomy  11/09/2001    Dr Lennie Hummer  . Nasal polyp surgery    . Ptca  15 yrs ago  . External ear surgery  2 years ago  . Laparoscopic inguinal hernia repair  01/27/11    right direct, Dr Greer Pickerel  . Hernia repair  2012    No family history on file.  Social History:   reports that he quit smoking about 36 years ago. His smoking use included Cigarettes. He has never used smokeless tobacco. He reports that he does not drink alcohol or use illicit drugs.  Allergies:  Allergies  Allergen Reactions  . Cefpodoxime Proxetil Other (See Comments)    unknown    Medications: Prior to Admission:  (Not in a hospital admission)  Results for orders placed or performed during the hospital encounter of 04/21/15 (from the past 48 hour(s))  Comprehensive metabolic panel     Status: Abnormal   Collection Time: 04/21/15 11:40 AM  Result Value Ref Range   Sodium 131 (L) 135 - 145 mmol/L   Potassium 4.1 3.5 - 5.1 mmol/L   Chloride 105 101 - 111 mmol/L   CO2 21 (L) 22 - 32 mmol/L   Glucose, Bld 109 (H) 65 - 99 mg/dL   BUN 17 6 - 20 mg/dL   Creatinine, Ser 1.01 0.61 - 1.24 mg/dL   Calcium 7.8 (L) 8.9 - 10.3 mg/dL   Total Protein 6.3 (L) 6.5 - 8.1 g/dL   Albumin 1.8 (L) 3.5 - 5.0 g/dL   AST 31 15 - 41 U/L   ALT 18 17 - 63 U/L   Alkaline Phosphatase 99 38 - 126 U/L   Total Bilirubin 0.7 0.3 - 1.2 mg/dL   GFR calc non Af Amer >60 >60 mL/min   GFR calc Af Amer >60 >60 mL/min  Comment: (NOTE) The eGFR has been calculated using the CKD EPI equation. This calculation has not been validated in all clinical situations. eGFR's persistently <60 mL/min signify possible Chronic Kidney Disease.    Anion gap 5 5 - 15  CBC with Differential     Status: Abnormal   Collection Time: 04/21/15 11:40 AM  Result Value Ref Range   WBC 1.5 (L) 4.0 - 10.5 K/uL    Comment: WHITE COUNT CONFIRMED ON SMEAR   RBC 3.18 (L) 4.22 - 5.81 MIL/uL   Hemoglobin 10.0 (L) 13.0 - 17.0 g/dL   HCT 30.0 (L) 39.0 - 52.0 %   MCV 94.3 78.0 - 100.0 fL   MCH 31.4 26.0 - 34.0 pg   MCHC 33.3 30.0 - 36.0 g/dL   RDW 18.1 (H) 11.5 - 15.5 %   Platelets 90 (L) 150 - 400 K/uL    Comment: PLATELET COUNT CONFIRMED BY SMEAR   Neutrophils Relative % 71 43 - 77 %   Lymphocytes Relative 16 12 - 46 %   Monocytes  Relative 12 3 - 12 %   Eosinophils Relative 1 0 - 5 %   Basophils Relative 0 0 - 1 %   Neutro Abs 1.1 (L) 1.7 - 7.7 K/uL   Lymphs Abs 0.2 (L) 0.7 - 4.0 K/uL   Monocytes Absolute 0.2 0.1 - 1.0 K/uL   Eosinophils Absolute 0.0 0.0 - 0.7 K/uL   Basophils Absolute 0.0 0.0 - 0.1 K/uL   WBC Morphology TOXIC GRANULATION   Culture, blood (routine x 2)     Status: None (Preliminary result)   Collection Time: 04/21/15 11:40 AM  Result Value Ref Range   Specimen Description BLOOD LEFT FOREARM    Special Requests BOTTLES DRAWN AEROBIC AND ANAEROBIC 5CC    Culture PENDING    Report Status PENDING   Protime-INR     Status: Abnormal   Collection Time: 04/21/15 11:40 AM  Result Value Ref Range   Prothrombin Time 63.3 (H) 11.6 - 15.2 seconds   INR 7.88 (HH) 0.00 - 1.49    Comment: REPEATED TO VERIFY CRITICAL RESULT CALLED TO, READ BACK BY AND VERIFIED WITH: Ida Rogue AT 1307 04/21/15 BY K BARR   Culture, blood (routine x 2)     Status: None (Preliminary result)   Collection Time: 04/21/15 11:45 AM  Result Value Ref Range   Specimen Description BLOOD RIGHT FOREARM    Special Requests BOTTLES DRAWN AEROBIC AND ANAEROBIC 5CC    Culture PENDING    Report Status PENDING   I-Stat CG4 Lactic Acid, ED (Not at [facility])     Status: Abnormal   Collection Time: 04/21/15 12:12 PM  Result Value Ref Range   Lactic Acid, Venous 2.01 (HH) 0.5 - 2.0 mmol/L  Urinalysis, Routine w reflex microscopic (not at [facility])     Status: None   Collection Time: 04/21/15 12:20 PM  Result Value Ref Range   Color, Urine YELLOW YELLOW   APPearance CLEAR CLEAR   Specific Gravity, Urine 1.019 1.005 - 1.030   pH 5.0 5.0 - 8.0   Glucose, UA NEGATIVE NEGATIVE mg/dL   Hgb urine dipstick NEGATIVE NEGATIVE   Bilirubin Urine NEGATIVE NEGATIVE   Ketones, ur NEGATIVE NEGATIVE mg/dL   Protein, ur NEGATIVE NEGATIVE mg/dL   Urobilinogen, UA 1.0 0.0 - 1.0 mg/dL   Nitrite NEGATIVE NEGATIVE   Leukocytes, UA NEGATIVE NEGATIVE    Comment:  MICROSCOPIC NOT DONE ON URINES WITH NEGATIVE PROTEIN, BLOOD, LEUKOCYTES, NITRITE, OR GLUCOSE <1000  mg/dL.  I-Stat CG4 Lactic Acid, ED (Not at [facility])     Status: None   Collection Time: 04/21/15  3:00 PM  Result Value Ref Range   Lactic Acid, Venous 1.27 0.5 - 2.0 mmol/L  Prepare fresh frozen plasma     Status: None (Preliminary result)   Collection Time: 04/21/15  4:33 PM  Result Value Ref Range   Unit Number [id number]    Blood Component Type THW PLS APHR    Unit division A0    Status of Unit ALLOCATED    Transfusion Status OK TO TRANSFUSE    Unit Number [id number]    Blood Component Type THW PLS APHR    Unit division C0    Status of Unit ALLOCATED    Transfusion Status OK TO TRANSFUSE     Dg Chest 2 View  04/21/2015   CLINICAL DATA:  Shortness of breath  EXAM: CHEST  2 VIEW  COMPARISON:  03/21/2015  FINDINGS: Chronic interstitial coarsening, accentuated by hypoventilation. Posterior costophrenic sulci are not visible, but no suspected effusion. There is no edema, consolidation,or pneumothorax. Normal heart size and aortic contours.  IMPRESSION: No evidence of acute cardiopulmonary disease.   Electronically Signed   By: Monte Fantasia M.D.   On: 04/21/2015 14:14    Review of Systems  Constitutional: Negative.   HENT: Negative.   Eyes: Negative.   Respiratory: Positive for shortness of breath.        Complains of mild shortness of breath  Cardiovascular: Negative for chest pain.       History of atrial fibrillation  Gastrointestinal: Positive for abdominal pain and diarrhea. Negative for nausea and vomiting.       Recent diarrhea  Genitourinary: Negative.   Musculoskeletal:       Left hip pain  Skin: Negative.   Neurological: Positive for focal weakness. Negative for speech change.       See history of present illness  Endo/Heme/Allergies: Bruises/bleeds easily.  Psychiatric/Behavioral: Negative.    Blood pressure 101/39, pulse 78, temperature 99 F (37.2 C),  temperature source Rectal, resp. rate 34, height _0  (1.778 m), weight 65.772 kg (145 lb), SpO2 97 %. Physical Exam  Constitutional: He is oriented to person, place, and time. He appears well-developed. No distress.  HENT:  Head: Normocephalic and atraumatic.  Right Ear: External ear normal.  Left Ear: External ear normal.  Nose: Nose normal.  Mouth/Throat: Oropharynx is clear and moist.  Eyes: EOM are normal. Pupils are equal, round, and reactive to light. Right eye exhibits no discharge. Left eye exhibits no discharge.  Neck: No tracheal deviation present.  No posterior tenderness, no pain with active range of motion  Cardiovascular: Normal rate and normal heart sounds.   Sinus rhythm in the 80s, moderate pitting edema bilateral lower extremities  Respiratory: Breath sounds normal. No stridor. He has no wheezes. He has no rales.  Respiratory rate 30  GI: Soft. He exhibits no distension. There is tenderness. There is no rebound and no guarding.  Mild tenderness on the left, small contusion periumbilical region, fullness over left anterior superior iliac spine, no generalized tenderness, no peritoneal signs  Musculoskeletal:  Some discomfort with movement of left leg and hip region  Neurological: He is alert and oriented to person, place, and time. He exhibits normal muscle tone.  Very hard of hearing  Skin: Skin is warm.  Psychiatric: He has a normal mood and affect.    Assessment/Plan: 1. Spontaneous left psoas retroperitoneal hemorrhage due  to over anticoagulation - this is likely been gradual over several days and is responsible for his left hip issues as the blood tracks down to the hip musculature. This is not a traumatic injury. Recommend reversing anticoagulation. This has already been started in the emergency department. I also suspect that at his age with multiple falls, consideration should be made for not resuming anticoagulation therapy.  2. Splenomegaly with  heterogeneous appearance of the spleen along with leukopenia - this is concerning for lymphoma. I understand from discussing this with his family that he has undergone some testing at the [facility] which has not been conclusive at this point. This may be followed up as an outpatient.  Recommend admission by the medical service. We will follow-up.  Virgil Slinger E 04/21/2015, 4:51 PM

## 2015-04-21 NOTE — ED Notes (Signed)
Family at bedside. 

## 2015-04-21 NOTE — Telephone Encounter (Signed)
Patient Name: Kevin Rubio  DOB: [birth year]    Initial Comment Caller states is having severe diarrhea, blood pressure is 90/60, trouble walking, heart rate is irregular.   Nurse Assessment  Nurse: Julien Girt, RN, Almyra Free Date/Time Eilene Ghazi Time): 04/21/2015 10:51:12 AM  Confirm and document reason for call. If symptomatic, describe symptoms. 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 States he has diarrhea, weakness in his left leg, is having trouble walking, his heart rate is irregular and increases with exertion, and b/p was 90/60. Previous admission to the hospital for scrotal swelling. He had a fall 5-6 days ago, fell in the office on Monday and fell again this morning coming back to bed from the bathroom. EMS were called to the home this morning but did not see the need to transport. Adds upon exertion, approx 10 mins prior to call, b/p was 100/60, HR was 150-160. Pt denies chest pain or trouble breathing. He is currently on Cipro for "sepsis UTI", his urine is dark, and has had 2 episodes of diarrhea today. I ask the caller to recheck vitals, b/p is 90/50, HR is 80. Pt's wife is present, I can also hear pt answering questions.  Has the patient traveled out of the country within the last 30 days? ---Not Applicable  Does the patient require triage? ---Yes  Related visit to physician within the last 2 weeks? ---Yes   Monday  Does the PT have any chronic conditions? (i.e. diabetes, asthma, etc.) ---Yes  List chronic conditions. ---Afib, Htn High cholesterol     Guidelines    Guideline Title Affirmed Question Affirmed Notes  Low Blood Pressure Patient sounds very sick or weak to the triager diarrhea, increased HR w/exertion, r/o dehydration   Final Disposition User   Call EMS 911 Now Julien Girt, RN, Almyra Free    Comments  Upgraded to call 911 for transportation, caller's wife can drive but feels he needs to be transported via ambulance and I agree. Clair Gulling will stay at the home until EMS arrives.    Disagree/Comply: Comply

## 2015-04-21 NOTE — Consult Note (Addendum)
Palliative care consult received based on patient's age, co-morbidities, frequent falls and possible need for surgical intervention. I reviewed his chart in detail and this is his second hospitalization in 6 months. He is clearly showing signs of decline, but has despite this maintained a fairly high quality of life. Most concerning in addition to the spontaneous psoas bleed and frequent falls is the fact that he has an Albumin of 1.8, splenomegaly, intermittent unexplained fevers prior to admission and fairly significant pancytopenia- both very suspicious for occult malignancy.  I discussed his case in detail with EDP and Dr. Ree Kida. Dr. Ree Kida will provide primary palliative care including preliminary goals of care discussion and determine code status. If he stabilizes or discharges home quickly, I would strongly recommend that [facility] care management see if he is eligible for high risk services and consider home health PT/balance assessment/assist device recommendations and other in home needs assessment to prevent rehospitalization. Further work up for malignancy may or may not be warranted- he would likely not be a good candidate for chemotherapy or aggressive interventions- there is however, the potential that further work up could provide useful diagnostic information that would be helpful in prognostication and in obtaining additional services such as hospice care at home if he meets eligibility criteria.   Our team will follow along for any additional needs or changes in his condition that may warrant additional palliative care intervention.   Lane Hacker, DO Palliative Medicine [phone]

## 2015-04-21 NOTE — ED Provider Notes (Signed)
CSN: [id number]     Arrival date & time 04/21/15  1124 History   First MD Initiated Contact with Patient 04/21/15 1127     Chief Complaint  Patient presents with  . Fall     (Consider location/radiation/quality/duration/timing/severity/associated sxs/prior Treatment) HPI Comments: Pt is a 81 yo male with history of afib who presents to the ED s/p fall, onset PTA. Pt's wife reports she witnessed the pt have a mechanical fall at home and reports his left leg gave out and he fell to the floor. Denies head injury or LOC. She states she called home health to come evaluate the pt and home health reported the pt has low BP and irregular heart rate and advised them to come to the ED. Wife notes the pt was seen by his PCP yesterday, dx with UTI and was started on Cipro. Endorses lower abdominal pain and fever for the past few days. Wife reports increased swelling of lower extremities over the past week. Pt denies headache, vision changes, SOB, CP, N/V/D. Pt was on coumadin but his PCP d/c it while he on taking abx for UTI. Wife notes that pt also fell last Wednesday.   Patient is a 81 y.o. male presenting with fall.  Fall Associated symptoms include abdominal pain and a fever.    Past Medical History  Diagnosis Date  . ANEMIA, PERNICIOUS 05/14/2007  . Atrial fibrillation 08/08/2007  . BENIGN PROSTATIC HYPERTROPHY 03/05/2007  . CORONARY ARTERY DISEASE 03/05/2007  . DYSPLASTIC NEVUS 01/24/2008  . HYPERLIPIDEMIA 03/05/2007  . HYPERTENSION 03/05/2007  . MYOCARDIAL INFARCTION, HX OF 03/05/2007    1982  . NEUTROPENIA NOS 05/11/2007  . Rheumatoid arthritis(714.0) 03/05/2007  . VITAMIN B12 DEFICIENCY 08/08/2007  . Current use of long term anticoagulation   . Rheumatoid arthritis(714.0)   . Hernia     umbilical   Past Surgical History  Procedure Laterality Date  . Laparoscopic cholecystectomy  11/09/2001    Dr Lennie Hummer  . Nasal polyp surgery    . Ptca  15 yrs ago  . External ear surgery  2 years ago   . Laparoscopic inguinal hernia repair  01/27/11    right direct, Dr Greer Pickerel  . Hernia repair  2012   No family history on file. Social History  Substance Use Topics  . Smoking status: Former Smoker    Types: Cigarettes    Quit date: 08/22/1978  . Smokeless tobacco: Never Used  . Alcohol Use: No    Review of Systems  Constitutional: Positive for fever.  Gastrointestinal: Positive for abdominal pain.  All other systems reviewed and are negative.     Allergies  Cefpodoxime proxetil  Home Medications   Prior to Admission medications   Medication Sig Start Date End Date Taking? Authorizing Provider  acetaminophen (TYLENOL) 500 MG tablet Take 1,000 mg by mouth daily as needed for mild pain.    Historical Provider, MD  ascorbic Acid (VITAMIN C) 500 MG CPCR Take 500 mg by mouth daily.      Historical Provider, MD  ciprofloxacin (CIPRO) 500 MG tablet Take 1 tablet (500 mg total) by mouth 2 (two) times daily. 04/20/15   Marletta Lor, MD  cyanocobalamin (,VITAMIN B-12,) 1000 MCG/ML injection Inject 1,000 mcg into the muscle every 30 (thirty) days.    Historical Provider, MD  cycloSPORINE (RESTASIS) 0.05 % ophthalmic emulsion Place 1 drop into both eyes 2 (two) times daily.    Historical Provider, MD  Ferrous Sulfate (IRON CR PO) Take 65  mg by mouth daily.      Historical Provider, MD  levothyroxine (SYNTHROID, LEVOTHROID) 25 MCG tablet TAKE ONE TABLET BY MOUTH ONCE DAILY BEFORE  BREAKFAST 11/28/14   Marletta Lor, MD  metoprolol tartrate (LOPRESSOR) 25 MG tablet Take 1 tablet (25 mg total) by mouth 2 (two) times daily. 04/01/15   Marletta Lor, MD  Multiple Vitamins-Minerals (CENTRUM SILVER PO) Take 1 tablet by mouth daily.     Historical Provider, MD  Omega-3 Fatty Acids (FISH OIL) 1000 MG CAPS Take 2 capsules by mouth daily.      Historical Provider, MD  pravastatin (PRAVACHOL) 40 MG tablet TAKE ONE TABLET BY MOUTH ONCE DAILY 04/20/15   Marin Olp, MD   tamsulosin (FLOMAX) 0.4 MG CAPS capsule Take 1 capsule (0.4 mg total) by mouth daily. 04/01/15   Marletta Lor, MD  warfarin (COUMADIN) 4 MG tablet Take 4-6 mg by mouth daily. 6 mg on Thurs and Sunday.  4 mg all other days.    Historical Provider, MD   BP 104/50 mmHg  Pulse 72  Temp(Src) 97.9 F (36.6 C) (Oral)  Resp 38  Ht 5\' 10"  (1.778 m)  Wt 145 lb (65.772 kg)  BMI 20.81 kg/m2  SpO2 98% Physical Exam  Constitutional: He is oriented to person, place, and time.  Thin, frail appearing elderly male in NAD.  HENT:  Head: Normocephalic and atraumatic. Head is without raccoon's eyes, without Battle's sign, without abrasion and without contusion.  Right Ear: Tympanic membrane and external ear normal. No hemotympanum.  Left Ear: Tympanic membrane and external ear normal. No hemotympanum.  Nose: Nose normal. No nose lacerations or nasal septal hematoma.  Mouth/Throat: Uvula is midline, oropharynx is clear and moist and mucous membranes are normal.  Eyes: Conjunctivae and EOM are normal. Pupils are equal, round, and reactive to light. Right eye exhibits no discharge. Left eye exhibits no discharge. No scleral icterus.  Neck: Normal range of motion. Neck supple.  Cardiovascular: Normal rate, regular rhythm, normal heart sounds and intact distal pulses.   Pulmonary/Chest: Effort normal and breath sounds normal. He has no wheezes. He has no rales. He exhibits no tenderness.  Abdominal: Soft. Bowel sounds are normal. He exhibits no distension and no mass. There is tenderness in the right lower quadrant and left lower quadrant. There is no rebound and no guarding.  Musculoskeletal: Normal range of motion. He exhibits tenderness. He exhibits no edema.  2+ pitting edema of bilateral lower extremities. TTP at left lateral hip. Decreased strength of left hip, no deformity contusion or abrasion noted.   Lymphadenopathy:    He has no cervical adenopathy.  Neurological: He is alert and oriented to  person, place, and time. He has normal strength. No cranial nerve deficit or sensory deficit.  Skin: Skin is warm and dry.  Multiple bruises noted on bilateral upper extremities.  Nursing note and vitals reviewed.   ED Course  Procedures (including critical care time) Labs Review Labs Reviewed  CBC WITH DIFFERENTIAL/PLATELET - Abnormal; Notable for the following:    RBC 3.18 (*)    Hemoglobin 10.0 (*)    HCT 30.0 (*)    RDW 18.1 (*)    All other components within normal limits  CULTURE, BLOOD (ROUTINE X 2)  CULTURE, BLOOD (ROUTINE X 2)  URINE CULTURE  COMPREHENSIVE METABOLIC PANEL  URINALYSIS, ROUTINE W REFLEX MICROSCOPIC (NOT AT [facility])  PROTIME-INR  I-STAT CG4 LACTIC ACID, ED    Imaging Review No results found.  I have personally reviewed and evaluated these images and lab results as part of my medical decision-making.   EKG Interpretation   Date/Time:  Tuesday April 21 2015 11:36:41 EDT Ventricular Rate:  72 PR Interval:  190 QRS Duration: 87 QT Interval:  417 QTC Calculation: 456 R Axis:   54 Text Interpretation:  Sinus rhythm Abnormal R-wave progression, early  transition Confirmed by KNOTT MD, DANIEL ([id number]) on 04/21/2015 11:42:38 AM     Filed Vitals:   04/21/15 1615  BP: 101/39  Pulse: 78  Temp:   Resp: 34   Meds given in ED:  Medications  0.9 %  sodium chloride infusion (0 mLs Intravenous Stopped 04/21/15 1400)    Followed by  0.9 %  sodium chloride infusion (1,000 mLs Intravenous New Bag/Given 04/21/15 1627)  iohexol (OMNIPAQUE) 300 MG/ML solution 25 mL (not administered)  phytonadione (VITAMIN K) 10 mg in dextrose 5 % 50 mL IVPB (10 mg Intravenous New Bag/Given 04/21/15 1627)  sodium chloride 0.9 % bolus 1,000 mL (0 mLs Intravenous Stopped 04/21/15 1449)  iohexol (OMNIPAQUE) 300 MG/ML solution 100 mL (100 mLs Intravenous Contrast Given 04/21/15 1527)    New Prescriptions   No medications on file     MDM   Final diagnoses:  Fall, initial  encounter  Hemorrhage  Neutropenia  Thrombocytopenia  Hypotension, unspecified hypotension type    Pt presents s/p fall with reported hypotension from home health. Pt endorses abdominal pain, fever and swelling of lower extremities. Pt hypotensive, afebrile in ED. Pt given IVF.  UA unremarkable. CBC consistent with chronic neutropenia and thrombocytopenia. Wife reports pt has had a work-[facility] and was not given an explanation for his neutropenia and thrombocytopenia, pt was started on iron supplements. Lactic acid 2.01. INR 7.88. CXR negative. CT abd/pelvis ordered to evaluate for intra-abdominal bleeding. Wife reports pt have been having left hip pain associated with falls, asked for CT abd/pelvis to include left hip due to pain.  CT showed . Pt given vitamin K for coumadin reversal due to bleed. Trauma consulted, agrees with plan for warfarin reversal and advised to order FFP, they will come see the pt, CT results not complete but he reports psoas hemorrhage with possible lymphoma. Consult hospitalist, Dr. Ree Kida agrees to admit, will place orders for tele bed. Pallative care came to see pt, pt is a DNR.    Nona Dell, PA-C 04/21/15 [address] [place], [place] 04/21/15 1714  Leo Grosser, MD 04/22/15 [phone]

## 2015-04-21 NOTE — H&P (Signed)
[facility] History and Physical  Kevin Rubio OFB:[id number] DOB: [birth year] DOA: 04/21/2015  Referring physician: Ms. Harlene Ramus, PA/ Dr. Leo Grosser, EDP PCP: Nyoka Cowden, MD  Specialists: Dr. Wynonia Lawman, cardiology  Chief Complaint: Fall, left hip pain  HPI: Kevin Rubio is a 81 y.o. male  With a history of atrial fibrillation on Coumadin, recent admission for urinary tract infection, anemia, hypertension hyperlipidemia, that presented to the emergency department after falling. Patient fell today and has been frequently over the last several days. He states his "legs have been giving out".  Patient does use a walker for ambulation. Patient was recently seen by his primary care physician on 04/20/2015 and was being treated for urinary tract infection. He was recently taken off of his Coumadin by his primary care physician due to antibiotics. Today, patient fell and began implanting of left hip pain.  EMS was called and patient was brought to the hospital. Upon arrival to the emergency department, patient was noted to have an INR 7.88. EDP ordered fresh frozen plasma as well as vitamin K. Pending administration. CT of the abdomen and pelvis obtained showing left so as hematoma.Trauma Surgery was called. [facility] called for admission.  Review of Systems:  Constitutional: Denies fever, chills, diaphoresis, appetite change and fatigue.  HEENT: Denies photophobia, eye pain, redness, hearing loss, ear pain, congestion, sore throat, rhinorrhea, sneezing, mouth sores, trouble swallowing, neck pain, neck stiffness and tinnitus.   Respiratory: Complains of mild SOB occasionally.  Cardiovascular: Denies chest pain, palpitations and leg swelling.  Gastrointestinal: Complains of abdominal pain, alternating diarrhea/constipation. Genitourinary: Denies dysuria, urgency, frequency, hematuria, flank pain and difficulty urinating.  Musculoskeletal:Complains of left hip pain Skin: Denies pallor,  rash and wound.  Neurological: complains of generalized weakness  Hematological: Denies adenopathy. Easy bruising, personal or family bleeding history  Psychiatric/Behavioral: Denies suicidal ideation, mood changes, confusion, nervousness, sleep disturbance and agitation  Past Medical History  Diagnosis Date  . ANEMIA, PERNICIOUS 05/14/2007  . Atrial fibrillation 08/08/2007  . BENIGN PROSTATIC HYPERTROPHY 03/05/2007  . CORONARY ARTERY DISEASE 03/05/2007  . DYSPLASTIC NEVUS 01/24/2008  . HYPERLIPIDEMIA 03/05/2007  . HYPERTENSION 03/05/2007  . MYOCARDIAL INFARCTION, HX OF 03/05/2007    1982  . NEUTROPENIA NOS 05/11/2007  . Rheumatoid arthritis(714.0) 03/05/2007  . VITAMIN B12 DEFICIENCY 08/08/2007  . Current use of long term anticoagulation   . Rheumatoid arthritis(714.0)   . Hernia     umbilical   Past Surgical History  Procedure Laterality Date  . Laparoscopic cholecystectomy  11/09/2001    Dr Lennie Hummer  . Nasal polyp surgery    . Ptca  15 yrs ago  . External ear surgery  2 years ago  . Laparoscopic inguinal hernia repair  01/27/11    right direct, Dr Greer Pickerel  . Hernia repair  2012   Social History:  reports that he quit smoking about 36 years ago. His smoking use included Cigarettes. He has never used smokeless tobacco. He reports that he does not drink alcohol or use illicit drugs.   Allergies  Allergen Reactions  . Cefpodoxime Proxetil Other (See Comments)    unknown    Family History: Patient denies family history of hypertension, CAD, lymphoma/cancer.   Prior to Admission medications   Medication Sig Start Date End Date Taking? Authorizing Provider  acetaminophen (TYLENOL) 500 MG tablet Take 1,000 mg by mouth daily as needed for mild pain.   Yes Historical Provider, MD  ascorbic Acid (VITAMIN C) 500 MG CPCR Take 500 mg  by mouth daily.     Yes Historical Provider, MD  cycloSPORINE (RESTASIS) 0.05 % ophthalmic emulsion Place 1 drop into both eyes 2 (two) times daily.    Yes Historical Provider, MD  Ferrous Sulfate (IRON CR PO) Take 65 mg by mouth daily.     Yes Historical Provider, MD  levothyroxine (SYNTHROID, LEVOTHROID) 25 MCG tablet TAKE ONE TABLET BY MOUTH ONCE DAILY BEFORE  BREAKFAST 11/28/14  Yes Marletta Lor, MD  metoprolol tartrate (LOPRESSOR) 25 MG tablet Take 1 tablet (25 mg total) by mouth 2 (two) times daily. 04/01/15  Yes Marletta Lor, MD  Multiple Vitamins-Minerals (CENTRUM SILVER PO) Take 1 tablet by mouth daily.    Yes Historical Provider, MD  Omega-3 Fatty Acids (FISH OIL) 1000 MG CAPS Take 2 capsules by mouth daily.     Yes Historical Provider, MD  pravastatin (PRAVACHOL) 40 MG tablet TAKE ONE TABLET BY MOUTH ONCE DAILY 04/20/15  Yes Marin Olp, MD  tamsulosin (FLOMAX) 0.4 MG CAPS capsule Take 1 capsule (0.4 mg total) by mouth daily. 04/01/15  Yes Marletta Lor, MD  cyanocobalamin (,VITAMIN B-12,) 1000 MCG/ML injection Inject 1,000 mcg into the muscle every 30 (thirty) days.    Historical Provider, MD   Physical Exam: Filed Vitals:   04/21/15 1714  BP: 120/51  Pulse: 78  Temp: 98.2 F (36.8 C)  Resp: 29     General: Well developed, thin, NAD, appears stated age  HEENT: NCAT, PERRLA, EOMI, Anicteic Sclera, mucous membranes moist.   Neck: Supple, no JVD, no masses  Cardiovascular: S1 S2 auscultated, no rubs, murmurs or gallops. Regular rate and rhythm.  Respiratory: Clear to auscultation bilaterally with equal chest rise  Abdomen: Soft, Left side TTP, nondistended, + bowel sounds. Small contusion in periumbilical region, fullness in LLQ/anterior superior iliac area  Extremities: warm dry without cyanosis clubbing. +2 pitting edema in LE B/L   Neuro: AAOx3, cranial nerves grossly intact. Strength 5/5 in patient's upper and lower extremities bilaterally  Skin: Without rashes exudates or nodules  Psych: Normal affect and demeanor with intact judgement and insight  Labs on Admission:  Basic Metabolic  Panel:  Recent Labs Lab 04/20/15 1524 04/21/15 1140  NA 132* 131*  K 4.0 4.1  CL 103 105  CO2 23 21*  GLUCOSE 107* 109*  BUN 21 17  CREATININE 0.97 1.01  CALCIUM 8.2* 7.8*   Liver Function Tests:  Recent Labs Lab 04/20/15 1524 04/21/15 1140  AST 23 31  ALT 19 18  ALKPHOS 110 99  BILITOT 0.8 0.7  PROT 7.5 6.3*  ALBUMIN 2.4* 1.8*   No results for input(s): LIPASE, AMYLASE in the last 168 hours. No results for input(s): AMMONIA in the last 168 hours. CBC:  Recent Labs Lab 04/20/15 1524 04/21/15 1140  WBC 2.4 Repeated and verified X2.* 1.5*  NEUTROABS 1.9 1.1*  HGB 10.7* 10.0*  HCT 32.6* 30.0*  MCV 94.4 94.3  PLT 113.0 Repeated and verified X2.* 90*   Cardiac Enzymes: No results for input(s): CKTOTAL, CKMB, CKMBINDEX, TROPONINI in the last 168 hours.  BNP (last 3 results)  Recent Labs  09/17/14 1900 03/21/15 1635  BNP 302.1* 272.4*    ProBNP (last 3 results) No results for input(s): PROBNP in the last 8760 hours.  CBG: No results for input(s): GLUCAP in the last 168 hours.  Radiological Exams on Admission: Dg Chest 2 View  04/21/2015   CLINICAL DATA:  Shortness of breath  EXAM: CHEST  2 VIEW  COMPARISON:  03/21/2015  FINDINGS: Chronic interstitial coarsening, accentuated by hypoventilation. Posterior costophrenic sulci are not visible, but no suspected effusion. There is no edema, consolidation,or pneumothorax. Normal heart size and aortic contours.  IMPRESSION: No evidence of acute cardiopulmonary disease.   Electronically Signed   By: Monte Fantasia M.D.   On: 04/21/2015 14:14   Ct Abdomen Pelvis W Contrast  04/21/2015   CLINICAL DATA:  Fall yesterday and today. Elevated INR. Left hip and abdominal pain.  EXAM: CT ABDOMEN AND PELVIS WITH CONTRAST  TECHNIQUE: Multidetector CT imaging of the abdomen and pelvis was performed using the standard protocol following bolus administration of intravenous contrast.  CONTRAST:  166m OMNIPAQUE IOHEXOL 300 MG/ML   SOLN  COMPARISON:  03/21/2015  FINDINGS: There are small bilateral pleural effusions. Heart is normal size. Minimal dependent bibasilar atelectasis.  Prior cholecystectomy. Liver, pancreas, adrenals are unremarkable. Small cyst in the lower pole of the right kidney which appears benign.  There is splenomegaly. Spleen measures 18 cm in craniocaudal length.  There is a left psoas and ileus psoas fluid collection with rim enhancement. This measures 5.8 by 6.7 cm in axial imaging. This is approximately 14 cm in craniocaudal length on coronal imaging. This most likely scratch sec given the history, this most likely represents psoas hematoma. No active extravasation. This displaces the left kidney out of the left renal fossa.  Aorta and iliac vessels are calcified, non aneurysmal. There is sigmoid diverticulosis. No active diverticulitis. Stomach and small bowel are unremarkable. Urinary bladder unremarkable. Small amount of free fluid adjacent to the spleen and in the cul-de-sac. No adenopathy. No free air.  Rightward scoliosis and degenerative changes in the lumbar spine. No acute bony abnormality or focal bone lesion.  IMPRESSION: Left psoas and ileo psoas fluid collection. Given history this is most compatible with hematoma.  Splenomegaly.  Small amount of free fluid in the abdomen and pelvis. Small bilateral pleural effusions.  These results were called by telephone at the time of interpretation on 04/21/2015 at 4:40 pm to Dr. BGeorganna Skeans who verbally acknowledged these results.   Electronically Signed   By: KRolm BaptiseM.D.   On: 04/21/2015 16:59    EKG: Independently reviewed. Sinus rhythm, rate 72  Assessment/Plan  Left psoas and iliopsoas hematoma/Left hip pain -Seen on CT abdomen and pelvis -Patient will be admitted to telemetry -Will monitor H&H every 8 hours -Trauma Surgery consulted and appreciated -Will reverse supratherapeutic INR (fresh frozen plasma and vitamin K ordered in the  ED)  Splenomegaly with pancytopenia -Concerning for lymphoma -Per wife, patient has had several procedures including bone marrow biopsy in the past and follows up with Dr. SAlen Blewevery 6 months at the cancer center.  (added Dr. SAlen Blewto treatment team) -Will continue to monitor CBC  Atrial fibrillation -Currently in sinus rhythm, rate controlled -Continue metoprolol -Patient was on Coumadin however this was discontinued by his primary care physician due to antibiotics use -Coumadin should be indefinitely held due to fall and bleeding risk  Supratherapeutic INR -Likely complicated by recent antibiosis -INR being reversed with fresh frozen plasma and vitamin K -Will continue to monitor  Recent urinary tract infection -Supposedly diagnosed on 04/20/2015 by PCP -UA today 04/21/2015 negative for infection -Patient was recently admitted and discharged 03/26/2015 with MSSA UTI as well as orchitis, patient was to follow-up with urology.  At that time, patient was placed on Levaquin for 10 day course -Will hold antibiosis except this time.  Falls -Will consult PT  and OT for evaluation and treatment  Hypothyroidism -Continue Synthroid -TSH 7.97 on 04/20/2015  -We'll obtain free T4 level  Hyperlipidemia -Continue statin  DVT prophylaxis: SCDs  Code Status: DNR  Condition: Guarded  Family Communication: Wife at bedside. Admission, patients condition and plan of care including tests being ordered have been discussed with the patient and wife who indicate understanding and agree with the plan and Code Status.  Disposition Plan: Admitted for observation   Time spent: 65 minutes  Rainelle Sulewski D.O. [facility] Pager [phone]  If 7PM-7AM, please contact night-coverage [email or web address] Password [email or web address] 04/21/2015, 5:20 PM

## 2015-04-21 NOTE — Telephone Encounter (Signed)
Received phone call from pt's wife Curly [place] she said pt fell again this morning and she had to call [employer] and EMS. He was checked out by EMS said he was okay and did not need to go to the hospital but they said to contact his doctor. Told Juanita I will let Dr.K know. Juanita verbalized understanding.  Told Dr.K pt fell again. He asked if working on referral for home health? Told him I sent it urgent will check with Neoma Laming.  Checked with Neoma Laming and she just got message from [facility] that they can not take pt due to shortage of staff and she will send it over to [place]. Dr.K notified.

## 2015-04-21 NOTE — ED Notes (Signed)
Patient states he has had a few falls over last several Days. Patient was Dx with Sepsis due to UTI yesterday and was given Cipro. Patient was advised to stop his blood thinner as well. Patient denies any pain. Patient has increase weakness to left leg.

## 2015-04-22 ENCOUNTER — Observation Stay (HOSPITAL_COMMUNITY): Payer: Medicare Other

## 2015-04-22 DIAGNOSIS — S301XXA Contusion of abdominal wall, initial encounter: Secondary | ICD-10-CM

## 2015-04-22 DIAGNOSIS — I481 Persistent atrial fibrillation: Secondary | ICD-10-CM | POA: Diagnosis not present

## 2015-04-22 DIAGNOSIS — R161 Splenomegaly, not elsewhere classified: Secondary | ICD-10-CM | POA: Diagnosis not present

## 2015-04-22 DIAGNOSIS — R627 Adult failure to thrive: Secondary | ICD-10-CM | POA: Diagnosis not present

## 2015-04-22 DIAGNOSIS — R918 Other nonspecific abnormal finding of lung field: Secondary | ICD-10-CM | POA: Diagnosis not present

## 2015-04-22 DIAGNOSIS — D68318 Other hemorrhagic disorder due to intrinsic circulating anticoagulants, antibodies, or inhibitors: Secondary | ICD-10-CM | POA: Diagnosis not present

## 2015-04-22 DIAGNOSIS — I509 Heart failure, unspecified: Secondary | ICD-10-CM | POA: Diagnosis not present

## 2015-04-22 DIAGNOSIS — D709 Neutropenia, unspecified: Secondary | ICD-10-CM | POA: Diagnosis not present

## 2015-04-22 DIAGNOSIS — S3991XA Unspecified injury of abdomen, initial encounter: Secondary | ICD-10-CM | POA: Diagnosis not present

## 2015-04-22 DIAGNOSIS — D649 Anemia, unspecified: Secondary | ICD-10-CM | POA: Diagnosis not present

## 2015-04-22 DIAGNOSIS — I959 Hypotension, unspecified: Secondary | ICD-10-CM | POA: Diagnosis not present

## 2015-04-22 DIAGNOSIS — D696 Thrombocytopenia, unspecified: Secondary | ICD-10-CM | POA: Diagnosis not present

## 2015-04-22 DIAGNOSIS — I480 Paroxysmal atrial fibrillation: Secondary | ICD-10-CM | POA: Diagnosis not present

## 2015-04-22 DIAGNOSIS — D61818 Other pancytopenia: Secondary | ICD-10-CM | POA: Diagnosis not present

## 2015-04-22 DIAGNOSIS — W19XXXA Unspecified fall, initial encounter: Secondary | ICD-10-CM | POA: Diagnosis not present

## 2015-04-22 LAB — URINE CULTURE: CULTURE: NO GROWTH

## 2015-04-22 LAB — HEMOGLOBIN AND HEMATOCRIT, BLOOD
HCT: 25.6 % — ABNORMAL LOW (ref 39.0–52.0)
HEMATOCRIT: 31 % — AB (ref 39.0–52.0)
HEMATOCRIT: 28.8 % — AB (ref 39.0–52.0)
HEMOGLOBIN: 8.1 g/dL — AB (ref 13.0–17.0)
Hemoglobin: 10 g/dL — ABNORMAL LOW (ref 13.0–17.0)
Hemoglobin: 9.2 g/dL — ABNORMAL LOW (ref 13.0–17.0)

## 2015-04-22 LAB — PREPARE RBC (CROSSMATCH)

## 2015-04-22 LAB — BASIC METABOLIC PANEL
Anion gap: 7 (ref 5–15)
BUN: 11 mg/dL (ref 6–20)
CHLORIDE: 106 mmol/L (ref 101–111)
CO2: 20 mmol/L — ABNORMAL LOW (ref 22–32)
CREATININE: 0.77 mg/dL (ref 0.61–1.24)
Calcium: 7.4 mg/dL — ABNORMAL LOW (ref 8.9–10.3)
Glucose, Bld: 81 mg/dL (ref 65–99)
POTASSIUM: 3.3 mmol/L — AB (ref 3.5–5.1)
SODIUM: 133 mmol/L — AB (ref 135–145)

## 2015-04-22 LAB — PREPARE FRESH FROZEN PLASMA

## 2015-04-22 LAB — CBC
HCT: 27 % — ABNORMAL LOW (ref 39.0–52.0)
Hemoglobin: 8.4 g/dL — ABNORMAL LOW (ref 13.0–17.0)
MCH: 29.5 pg (ref 26.0–34.0)
MCHC: 31.1 g/dL (ref 30.0–36.0)
MCV: 94.7 fL (ref 78.0–100.0)
PLATELETS: 85 10*3/uL — AB (ref 150–400)
RBC: 2.85 MIL/uL — AB (ref 4.22–5.81)
RDW: 18.1 % — AB (ref 11.5–15.5)
WBC: 1.2 10*3/uL — AB (ref 4.0–10.5)

## 2015-04-22 LAB — MAGNESIUM: MAGNESIUM: 1.7 mg/dL (ref 1.7–2.4)

## 2015-04-22 LAB — T4, FREE: Free T4: 1.08 ng/dL (ref 0.61–1.12)

## 2015-04-22 LAB — MRSA PCR SCREENING: MRSA by PCR: NEGATIVE

## 2015-04-22 MED ORDER — CETYLPYRIDINIUM CHLORIDE 0.05 % MT LIQD
7.0000 mL | Freq: Two times a day (BID) | OROMUCOSAL | Status: DC
  Administered 2015-04-22 – 2015-04-26 (×8): 7 mL via OROMUCOSAL

## 2015-04-22 MED ORDER — LEVALBUTEROL TARTRATE 45 MCG/ACT IN AERO
1.0000 | INHALATION_SPRAY | Freq: Three times a day (TID) | RESPIRATORY_TRACT | Status: DC | PRN
  Filled 2015-04-22: qty 15

## 2015-04-22 MED ORDER — METOPROLOL TARTRATE 50 MG PO TABS
50.0000 mg | ORAL_TABLET | Freq: Two times a day (BID) | ORAL | Status: DC
  Filled 2015-04-22 (×2): qty 1

## 2015-04-22 MED ORDER — LEVALBUTEROL HCL 0.63 MG/3ML IN NEBU
0.6300 mg | INHALATION_SOLUTION | Freq: Four times a day (QID) | RESPIRATORY_TRACT | Status: DC
  Administered 2015-04-22 – 2015-04-23 (×4): 0.63 mg via RESPIRATORY_TRACT
  Filled 2015-04-22 (×8): qty 3

## 2015-04-22 MED ORDER — DILTIAZEM LOAD VIA INFUSION
10.0000 mg | Freq: Once | INTRAVENOUS | Status: AC
  Administered 2015-04-22: 10 mg via INTRAVENOUS
  Filled 2015-04-22: qty 10

## 2015-04-22 MED ORDER — FUROSEMIDE 10 MG/ML IJ SOLN
40.0000 mg | Freq: Once | INTRAMUSCULAR | Status: DC
  Filled 2015-04-22: qty 4

## 2015-04-22 MED ORDER — IPRATROPIUM BROMIDE 0.02 % IN SOLN: 0.5000 mg | Freq: Four times a day (QID) | RESPIRATORY_TRACT | Status: DC

## 2015-04-22 MED ORDER — SODIUM CHLORIDE 0.9 % IV SOLN
Freq: Once | INTRAVENOUS | Status: AC
  Administered 2015-04-22: 11:00:00 via INTRAVENOUS

## 2015-04-22 MED ORDER — METOPROLOL TARTRATE 25 MG PO TABS
25.0000 mg | ORAL_TABLET | Freq: Two times a day (BID) | ORAL | Status: DC
  Administered 2015-04-22 – 2015-04-24 (×5): 25 mg via ORAL
  Filled 2015-04-22 (×6): qty 1

## 2015-04-22 MED ORDER — DEXTROSE 5 % IV SOLN
5.0000 mg/h | INTRAVENOUS | Status: DC
  Administered 2015-04-22: 10 mg/h via INTRAVENOUS

## 2015-04-22 MED ORDER — LEVALBUTEROL HCL 0.63 MG/3ML IN NEBU
0.6300 mg | INHALATION_SOLUTION | Freq: Three times a day (TID) | RESPIRATORY_TRACT | Status: DC | PRN
  Administered 2015-04-22: 0.63 mg via RESPIRATORY_TRACT
  Filled 2015-04-22: qty 3

## 2015-04-22 MED ORDER — IPRATROPIUM BROMIDE 0.02 % IN SOLN
0.5000 mg | Freq: Four times a day (QID) | RESPIRATORY_TRACT | Status: DC
  Administered 2015-04-22 – 2015-04-23 (×4): 0.5 mg via RESPIRATORY_TRACT
  Filled 2015-04-22 (×4): qty 2.5

## 2015-04-22 MED ORDER — ALBUTEROL SULFATE (2.5 MG/3ML) 0.083% IN NEBU: 2.5000 mg | INHALATION_SOLUTION | Freq: Four times a day (QID) | RESPIRATORY_TRACT | Status: DC | PRN

## 2015-04-22 MED ORDER — POTASSIUM CHLORIDE CRYS ER 20 MEQ PO TBCR
40.0000 meq | EXTENDED_RELEASE_TABLET | Freq: Once | ORAL | Status: AC
  Administered 2015-04-22: 40 meq via ORAL
  Filled 2015-04-22: qty 2

## 2015-04-22 NOTE — Progress Notes (Signed)
Patient admitted to unit from ED; pt alert and oriented; VSS and within pt baseline; no s/s of distress noted at this time; will continue to monitor and assist as needed.

## 2015-04-22 NOTE — Progress Notes (Signed)
Patient BP dropped into 80s while on card gtt. Drip was turned off but patient remained in the 80s. Dr. Erlinda Hong called. Per Dr. Erlinda Hong monitor for next hour and call results to MD. If in one hour patient BP still low interventions will be made. Patient BP increased to the 90s the next hour and into the low 100s after that. Dr. Erlinda Hong was made aware. No new orders given. Will cont to monitor

## 2015-04-22 NOTE — Progress Notes (Signed)
Report called to [place] on 3S.

## 2015-04-22 NOTE — Progress Notes (Signed)
Attempted report 

## 2015-04-22 NOTE — Progress Notes (Signed)
Patient to get 1 unit PRBCs. Per Dr. Erlinda Hong, hold off on giving unit to patient. Will cont to monitor.

## 2015-04-22 NOTE — Progress Notes (Signed)
Patient on cardizem gtt. Patient titrated up to 10 at 1400. At 1500 pt SBP was 89. HR in 90s, still in a fib. Drip turned off. Cardiology paged. Dr. Radford Pax called back. Per cardiology, leave cardizem drip off and continue to monitor patient.

## 2015-04-22 NOTE — Consult Note (Signed)
Admit date: 04/21/2015 Referring Physician  [facility] Primary Physician Nyoka Cowden, MD Primary Cardiologist : None  Reason for Consultation Atrial Fibrillation with RVR  HPI: Kevin Rubio is a 81 yo male with PMHx of chronic atrial fibrillation (off coumadin d/t fall risk), hypothyroidism, CAD, HTN and pancytopenia concerning for malignancy who presented to the ED on 8/30 with left hip pain after a fall. Patient was found to have a left psoas and iliopsoas hematoma and a supratherapeutic INR >7. He was given FFP and Vitamin K. Patient was evaluated by trauma surgery who recommended conservative management as H/H and INR had improved and stabilized.   This morning, patient went into atrial fibrillation with RVR. Patient was sustaining in the 130s with jumps to the 150s. Blood pressure is low-normal in the 100s/50s-120s/70s. He was symptomatic with shortness of breath and tachypnea requiring 2L [place]. He denies any chest pain. Patient was transferred to the SDU and started on a Cardizem gtt. Patient has a history of chronic atrial fibrillation and was on coumadin. Given fall risk, hematoma and supratherapeutic INR on admission, coumadin is being held indefinitely. For his chronic paroxysmal atrial fibrillation, patient is normally rate controlled on metoprolol 25 mg BID. Currently, patient states he is short of breath but comfortable with oxygen, but he denies any chest pain, nausea, lightheadedness or pain.  PMH:   Past Medical History  Diagnosis Date  . ANEMIA, PERNICIOUS 05/14/2007  . Atrial fibrillation 08/08/2007  . BENIGN PROSTATIC HYPERTROPHY 03/05/2007  . CORONARY ARTERY DISEASE 03/05/2007  . DYSPLASTIC NEVUS 01/24/2008  . HYPERLIPIDEMIA 03/05/2007  . NEUTROPENIA NOS 05/11/2007  . VITAMIN B12 DEFICIENCY 08/08/2007  . Current use of long term anticoagulation   . Hernia     umbilical  . Cancer of left external ear ~ 2010  . Anginal pain   . MI (myocardial infarction) 1983  X 2  . Hypotension   . Pneumonia ~ 12/2014  . GERD (gastroesophageal reflux disease)   . Hypothyroidism   . Rheumatoid arthritis(714.0) 03/05/2007    PSH:   Past Surgical History  Procedure Laterality Date  . Laparoscopic cholecystectomy  11/09/2001    Dr Lennie Hummer  . Nasal polyp surgery  1980's  . External ear surgery Left ~ 2014    "skin cancer"  . Laparoscopic inguinal hernia repair  01/27/11    right direct, Dr Greer Pickerel  . Inguinal hernia repair  2012  . Coronary angioplasty  ~ 2000  . Cataract extraction w/ intraocular lens  implant, bilateral Bilateral   . Corneal transplant Left ~ 2010   Allergies:  Cefpodoxime proxetil Prior to Admit Meds:   Prior to Admission medications   Medication Sig Start Date End Date Taking? Authorizing Provider  acetaminophen (TYLENOL) 500 MG tablet Take 1,000 mg by mouth daily as needed for mild pain.   Yes Historical Provider, MD  ascorbic Acid (VITAMIN C) 500 MG CPCR Take 500 mg by mouth daily.     Yes Historical Provider, MD  cycloSPORINE (RESTASIS) 0.05 % ophthalmic emulsion Place 1 drop into both eyes 2 (two) times daily.   Yes Historical Provider, MD  Ferrous Sulfate (IRON CR PO) Take 65 mg by mouth daily.     Yes Historical Provider, MD  levothyroxine (SYNTHROID, LEVOTHROID) 25 MCG tablet TAKE ONE TABLET BY MOUTH ONCE DAILY BEFORE  BREAKFAST 11/28/14  Yes Marletta Lor, MD  metoprolol tartrate (LOPRESSOR) 25 MG tablet Take 1 tablet (25 mg total) by mouth 2 (two) times  daily. 04/01/15  Yes Marletta Lor, MD  Multiple Vitamins-Minerals (CENTRUM SILVER PO) Take 1 tablet by mouth daily.    Yes Historical Provider, MD  Omega-3 Fatty Acids (FISH OIL) 1000 MG CAPS Take 2 capsules by mouth daily.     Yes Historical Provider, MD  pravastatin (PRAVACHOL) 40 MG tablet TAKE ONE TABLET BY MOUTH ONCE DAILY 04/20/15  Yes Marin Olp, MD  tamsulosin (FLOMAX) 0.4 MG CAPS capsule Take 1 capsule (0.4 mg total) by mouth daily. 04/01/15  Yes Marletta Lor, MD  cyanocobalamin (,VITAMIN B-12,) 1000 MCG/ML injection Inject 1,000 mcg into the muscle every 30 (thirty) days.    Historical Provider, MD   Fam HX:   History reviewed. No pertinent family history. Social HX:    Social History   Social History  . Marital Status: Married    Spouse Name: N/A  . Number of Children: N/A  . Years of Education: N/A   Occupational History  . Not on file.   Social History Main Topics  . Smoking status: Former Smoker -- 1.00 packs/day for 40 years    Types: Cigarettes  . Smokeless tobacco: Never Used     Comment: "quit smoking May  of 1983"  . Alcohol Use: No  . Drug Use: No  . Sexual Activity: Not on file   Other Topics Concern  . Not on file   Social History Narrative     ROS:   General: Denies fever, chills, fatigue,  and diaphoresis.  Respiratory: Admits to SOB. Denies cough, chest tightness Cardiovascular: Denies chest pain and palpitations.  Gastrointestinal: Denies nausea, vomiting, abdominal pain MSK: Denies pain Neurological: Denies dizziness, headaches, weakness, lightheadedness, and syncope  Filed Vitals:   04/22/15 0534 04/22/15 0904 04/22/15 0956 04/22/15 1059  BP: 105/58 127/76 114/67 92/51  Pulse: 71   99  Temp: 97.7 F (36.5 C)  97.6 F (36.4 C)   TempSrc: Oral  Oral   Resp: 22  21   Height:   5\' 8"  (1.727 m)   Weight:   156 lb 4.9 oz (70.9 kg)   SpO2: 99%      General: Vital signs reviewed.  Patient is well-developed and well-nourished, in no acute distress and cooperative with exam.  Neck: No JVD, no carotid bruit present.  Cardiovascular: Irregularly irregular, tachycardic Pulmonary/Chest: Mild expiratory wheezes in right lower lung field, mild inspiratory rales in bilateral lower lung fields, no rhonchi. Abdominal: Soft, non-tender, non-distended, BS + Musculoskeletal: Left hip is non-tender, no erythema or ecchymosis. Extremities: 2+ pitting edema to shins bilaterally. Pulses symmetric and intact  bilaterally.  Neurological: Awake and alert. Psychiatric: Normal mood and affect. speech and behavior is normal. Cognition and memory are normal.      Labs: Lab Results  Component Value Date   WBC 1.2* 04/22/2015   HGB 10.0* 04/22/2015   HCT 31.0* 04/22/2015   MCV 94.7 04/22/2015   PLT 85* 04/22/2015     Recent Labs Lab 04/21/15 1140 04/22/15 0600  NA 131* 133*  K 4.1 3.3*  CL 105 106  CO2 21* 20*  BUN 17 11  CREATININE 1.01 0.77  CALCIUM 7.8* 7.4*  PROT 6.3*  --   BILITOT 0.7  --   ALKPHOS 99  --   ALT 18  --   AST 31  --   GLUCOSE 109* 81    Lab Results  Component Value Date   CHOL 125 04/13/2010   HDL 19.1* 09/27/2007   LDLCALC 69  09/27/2007   TRIG 84 09/27/2007     Radiology:  Dg Chest 2 View  04/21/2015   CLINICAL DATA:  Shortness of breath  EXAM: CHEST  2 VIEW  COMPARISON:  03/21/2015  FINDINGS: Chronic interstitial coarsening, accentuated by hypoventilation. Posterior costophrenic sulci are not visible, but no suspected effusion. There is no edema, consolidation,or pneumothorax. Normal heart size and aortic contours.  IMPRESSION: No evidence of acute cardiopulmonary disease.   Electronically Signed   By: Monte Fantasia M.D.   On: 04/21/2015 14:14   Ct Abdomen Pelvis W Contrast  04/21/2015   CLINICAL DATA:  Fall yesterday and today. Elevated INR. Left hip and abdominal pain.  EXAM: CT ABDOMEN AND PELVIS WITH CONTRAST  TECHNIQUE: Multidetector CT imaging of the abdomen and pelvis was performed using the standard protocol following bolus administration of intravenous contrast.  CONTRAST:  142mL OMNIPAQUE IOHEXOL 300 MG/ML  SOLN  COMPARISON:  03/21/2015  FINDINGS: There are small bilateral pleural effusions. Heart is normal size. Minimal dependent bibasilar atelectasis.  Prior cholecystectomy. Liver, pancreas, adrenals are unremarkable. Small cyst in the lower pole of the right kidney which appears benign.  There is splenomegaly. Spleen measures 18 cm in  craniocaudal length.  There is a left psoas and ileus psoas fluid collection with rim enhancement. This measures 5.8 by 6.7 cm in axial imaging. This is approximately 14 cm in craniocaudal length on coronal imaging. This most likely scratch sec given the history, this most likely represents psoas hematoma. No active extravasation. This displaces the left kidney out of the left renal fossa.  Aorta and iliac vessels are calcified, non aneurysmal. There is sigmoid diverticulosis. No active diverticulitis. Stomach and small bowel are unremarkable. Urinary bladder unremarkable. Small amount of free fluid adjacent to the spleen and in the cul-de-sac. No adenopathy. No free air.  Rightward scoliosis and degenerative changes in the lumbar spine. No acute bony abnormality or focal bone lesion.  IMPRESSION: Left psoas and ileo psoas fluid collection. Given history this is most compatible with hematoma.  Splenomegaly.  Small amount of free fluid in the abdomen and pelvis. Small bilateral pleural effusions.  These results were called by telephone at the time of interpretation on 04/21/2015 at 4:40 pm to Dr. Georganna Skeans, who verbally acknowledged these results.   Electronically Signed   By: Rolm Baptise M.D.   On: 04/21/2015 16:59   Personally viewed.  EKG 8/30: Sinus Rhythm. Personally viewed.   ASSESSMENT/PLAN:    Atrial Fibrillation with RVR: This morning patient went into atrial fibrillation with RVR sustaining in the 130s with jumps to the 150s. Blood pressure is low-normal in the 100s/50s-120s/70s. Patient is symptomatic with shortness of breath but no chest pain. Patient is normally rate controlled on metoprolol 25 mg BID for chronic paroxysmal atrial fibrillation. TSH 7.97 on 04/20/2015. Will start on Cardizem gtt and continue metoprolol 25 mg BID. Given borderline BP, patient would likely not tolerate increase in metoprolol. Last echo in 2008 showed normal EF. Will recheck echocardiogram later today once heart  rate is controlled.  -Cardizem gtt (titrate for HR >60, <105) -Metoprolol 25 mg BID -Hold Coumadin indefinitely given high risk HAS-BLED score -Telemetry -Supplemental O2 as needed -Repeat EKG  Volume Overload: Patient has mild inspiratory crackles and pitting edema in lower extremities bilaterally and is now requiring supplemental oxygen via [place]. Last echo in 2008 showed normal EF. Will repeat echo once rate controlled.  -D/C NS at 125 cc/hr -Would avoid transfusing pRBCs given Hgb of 10.0  this morning and evidence of volume overload on examination -Echocardiogram -Consider lasix once rate controlled and if patient still has evidence of volume overload  Supratherapeutic INR: INR 7.88 on admission likely due to recent use of antibiotics. INR improved to 1.98 after fresh frozen plasma and vitamin K. Coumadin is being held indefinitely given fall risk and thrombocytopenia. HAS-BLED score of 4, putting him at high risk. -Agree with discontinuing coumadin given high risk of falls and hemorrhages  Left Psoas and Iliopsoas Hematoma s/p Fall: Seen by Trauma who recommends conservative management at this time as INR and H/H have improved and stabilized.   Pancytopenia with Splenomegaly: Patient follows with Dr. Alen Blew, Hem/Onc. DDx includes MDS, malignancy, autoimmune (Felty Syndrome). Bone Marrow Bx normal in 2010.   Kevin Craver, DO PGY-2 Internal Medicine Resident Pager # [phone] 04/22/2015 11:03 AM

## 2015-04-22 NOTE — Progress Notes (Signed)
[facility]  Patient Status: Active (receiving services up to time of hospitalization)  [facility] is providing the following services: RN and PT  If patient discharges after hours, please call [phone].   Consepcion Hearing 04/22/2015, 9:39 AM

## 2015-04-22 NOTE — Progress Notes (Signed)
Patient ID: Kevin Rubio, male   DOB: 01/19/1922, 81 y.o.   MRN: [id number]    Subjective: Denies any pain.  Says he didn't sleep well thinking about whats going to happen.  I told him the blood counts are stable and INR is down significantly.    Objective: Vital signs in last 24 hours: Temp:  [97.7 F (36.5 C)-99 F (37.2 C)] 97.7 F (36.5 C) (08/31 0534) Pulse Rate:  [68-88] 71 (08/31 0534) Resp:  [17-41] 22 (08/31 0534) BP: (93-125)/(36-58) 105/58 mmHg (08/31 0534) SpO2:  [92 %-100 %] 99 % (08/31 0534) Weight:  [65.772 kg (145 lb)] 65.772 kg (145 lb) (08/30 1135)    Lab Results:  CBC  Recent Labs  04/21/15 1140  04/22/15 0218 04/22/15 0600  WBC 1.5*  --   --  1.2*  HGB 10.0*  < > 8.1* 8.4*  HCT 30.0*  < > 25.6* 27.0*  PLT 90*  --   --  85*  < > = values in this interval not displayed. BMET  Recent Labs  04/21/15 1140 04/22/15 0600  NA 131* 133*  K 4.1 3.3*  CL 105 106  CO2 21* 20*  GLUCOSE 109* 81  BUN 17 11  CREATININE 1.01 0.77  CALCIUM 7.8* 7.4*    Imaging: Dg Chest 2 View  04/21/2015   CLINICAL DATA:  Shortness of breath  EXAM: CHEST  2 VIEW  COMPARISON:  03/21/2015  FINDINGS: Chronic interstitial coarsening, accentuated by hypoventilation. Posterior costophrenic sulci are not visible, but no suspected effusion. There is no edema, consolidation,or pneumothorax. Normal heart size and aortic contours.  IMPRESSION: No evidence of acute cardiopulmonary disease.   Electronically Signed   By: Monte Fantasia M.D.   On: 04/21/2015 14:14   Ct Abdomen Pelvis W Contrast  04/21/2015   CLINICAL DATA:  Fall yesterday and today. Elevated INR. Left hip and abdominal pain.  EXAM: CT ABDOMEN AND PELVIS WITH CONTRAST  TECHNIQUE: Multidetector CT imaging of the abdomen and pelvis was performed using the standard protocol following bolus administration of intravenous contrast.  CONTRAST:  197mL OMNIPAQUE IOHEXOL 300 MG/ML  SOLN  COMPARISON:  03/21/2015  FINDINGS: There are  small bilateral pleural effusions. Heart is normal size. Minimal dependent bibasilar atelectasis.  Prior cholecystectomy. Liver, pancreas, adrenals are unremarkable. Small cyst in the lower pole of the right kidney which appears benign.  There is splenomegaly. Spleen measures 18 cm in craniocaudal length.  There is a left psoas and ileus psoas fluid collection with rim enhancement. This measures 5.8 by 6.7 cm in axial imaging. This is approximately 14 cm in craniocaudal length on coronal imaging. This most likely scratch sec given the history, this most likely represents psoas hematoma. No active extravasation. This displaces the left kidney out of the left renal fossa.  Aorta and iliac vessels are calcified, non aneurysmal. There is sigmoid diverticulosis. No active diverticulitis. Stomach and small bowel are unremarkable. Urinary bladder unremarkable. Small amount of free fluid adjacent to the spleen and in the cul-de-sac. No adenopathy. No free air.  Rightward scoliosis and degenerative changes in the lumbar spine. No acute bony abnormality or focal bone lesion.  IMPRESSION: Left psoas and ileo psoas fluid collection. Given history this is most compatible with hematoma.  Splenomegaly.  Small amount of free fluid in the abdomen and pelvis. Small bilateral pleural effusions.  These results were called by telephone at the time of interpretation on 04/21/2015 at 4:40 pm to Dr. Georganna Skeans, who verbally acknowledged  these results.   Electronically Signed   By: Rolm Baptise M.D.   On: 04/21/2015 16:59     PE: General appearance: alert, cooperative and no distress GI: soft, non-tender; bowel sounds normal; no masses,  no organomegaly      Patient Active Problem List   Diagnosis Date Noted  . Hemorrhage 04/21/2015  . Psoas hematoma, left, secondary to anticoagulant therapy 04/21/2015  . Pancytopenia 04/21/2015  . Fall   . Atrial fibrillation with rapid ventricular response   . Sepsis due to urinary  tract infection   . Epididymitis   . Orchitis   . Arterial hypotension   . Acute kidney injury   . Chronic atrial fibrillation   . CAD in native artery   . Thrombocytopenia   . Leukopenia   . Chronic anemia   . Severe sepsis 03/21/2015  . Sepsis due to pneumonia 09/18/2014  . Blood poisoning   . Sepsis 09/17/2014  . Hypothyroidism 07/11/2014  . Impaired glucose tolerance 06/05/2014  . 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 Long-term (current) use of anticoagulants   . DYSPLASTIC NEVUS 01/24/2008  . Vitamin B 12 deficiency   . Atrial fibrillation   . Neutropenia 05/11/2007  . Hyperlipidemia   . Essential hypertension   . History of old anterior MI 84   . CAD (coronary artery disease)   . BPH   . Rheumatoid arthritis     Assessment/Plan: Spontaneous left psoas retroperitoneal hemorrhage supratherapeutic INR-at this point bleeding risk outweighs benefits of coumadin.  Would not recommend resuming, but certainly leave it up to primary team/cards.  H&h have stabilized and INR is down to 1.98.  No surgical indications.  Trauma services signing off.  Please call for further assistance. Splenomegaly/eukopenia/thrombocytopenia-OP f/u with Dr. Alen Blew.     Erby Pian, ANP-BC Pager: [phone] General Trauma PA Pager: [phone]   04/22/2015 7:55 AM

## 2015-04-22 NOTE — Progress Notes (Addendum)
09:55 Dr. Erlinda Hong at bedside and aware that patient is short of breath and symptomatic of cardiac arrhythmia. Patient on 2L  for comfort.  MD stated she will put in orders. Patient being prepared to transfer to 3S. Cardia PA called RN and made aware that Dr. Erlinda Hong stated she will be placing patient on cardiac drip on 3S.  10:00 Patient transferred to 3S11 with receiving RN Selenia at bedside.

## 2015-04-22 NOTE — Progress Notes (Signed)
Pharmacy note: diltiazem infusion  81 yo male with afib/RVR and pharmacy has been consulted to begin a diltiazem infusion. She is noted  S/p fall and is off coumadin due to left psoas and iliopsoas hematoma (no plans to resume due to fall risk). -HR in 130s (has been as high as 150s) -BP 114/67  Plan -Diltiazem IV bolus 10mg  IV (reduced dose due to recent BP in 100s) -Begin infusion at 5mg /hr and titrate to HR 65-105 -Further management per MD. Pharmacy will sign off  Hildred Laser, Pharm D 04/22/2015 10:59 AM

## 2015-04-22 NOTE — Progress Notes (Signed)
Pt in atrial fibrillation with RVR, HR between 130's-150's. Dr Erlinda Hong notified and orders placed to transfer patient to 3S for Cardizem drip. VSS, pt in no acute distress.

## 2015-04-22 NOTE — Evaluation (Signed)
Clinical/Bedside Swallow Evaluation Patient Details  Name: Kevin Rubio MRN: [id number] Date of Birth: 07-Dec-1921  Today's Date: 04/22/2015 Time: SLP Start Time (ACUTE ONLY): 1622 SLP Stop Time (ACUTE ONLY): 1638 SLP Time Calculation (min) (ACUTE ONLY): 16 min  Past Medical History:  Past Medical History  Diagnosis Date  . ANEMIA, PERNICIOUS 05/14/2007  . Atrial fibrillation 08/08/2007  . BENIGN PROSTATIC HYPERTROPHY 03/05/2007  . CORONARY ARTERY DISEASE 03/05/2007  . DYSPLASTIC NEVUS 01/24/2008  . HYPERLIPIDEMIA 03/05/2007  . NEUTROPENIA NOS 05/11/2007  . VITAMIN B12 DEFICIENCY 08/08/2007  . Current use of long term anticoagulation   . Hernia     umbilical  . Cancer of left external ear ~ 2010  . Anginal pain   . MI (myocardial infarction) 1983 X 2  . Hypotension   . Pneumonia ~ 12/2014  . GERD (gastroesophageal reflux disease)   . Hypothyroidism   . Rheumatoid arthritis(714.0) 03/05/2007   Past Surgical History:  Past Surgical History  Procedure Laterality Date  . Laparoscopic cholecystectomy  11/09/2001    Dr Lennie Hummer  . Nasal polyp surgery  1980's  . External ear surgery Left ~ 2014    "skin cancer"  . Laparoscopic inguinal hernia repair  01/27/11    right direct, Dr Greer Pickerel  . Inguinal hernia repair  2012  . Coronary angioplasty  ~ 2000  . Cataract extraction w/ intraocular lens  implant, bilateral Bilateral   . Corneal transplant Left ~ 2010   HPI:  [name] is a 81 yo male with PMHx of chronic atrial fibrillation (off coumadin d/t fall risk), hypothyroidism, CAD, HTN and pancytopenia concerning for malignancy who presented to the ED on 8/30 with left hip pain after a fall. Patient was found to have a left psoas and iliopsoas hematoma and a supratherapeutic INR >7. Pt was transferred to SDU for cardizem drip. CXR concerning for aspiration/PNA.   Assessment / Plan / Recommendation Clinical Impression  Pt has an increase in RR with intake of thin liquids and even  with large or consecutive sips of nectar thick liquids. CXR today is concerning for aspiration or PNA. Recommend Dys 3 diet and nectar thick liquids to reduce the risk of aspiration and conserve energy throughout meals. SLP to f/u for diet tolerance and readiness to advance.    Aspiration Risk  Mild    Diet Recommendation Dysphagia 3 (Mech soft);Nectar   Medication Administration: Whole meds with puree Compensations: Slow rate;Small sips/bites    Other  Recommendations Oral Care Recommendations: Oral care BID   Follow Up Recommendations   tba    Frequency and Duration min 2x/week  2 weeks   Pertinent Vitals/Pain Increase in RR to the low 30s as described above    SLP Swallow Goals     Swallow Study Prior Functional Status       General Other Pertinent Information: [name] is a 81 yo male with PMHx of chronic atrial fibrillation (off coumadin d/t fall risk), hypothyroidism, CAD, HTN and pancytopenia concerning for malignancy who presented to the ED on 8/30 with left hip pain after a fall. Patient was found to have a left psoas and iliopsoas hematoma and a supratherapeutic INR >7. Pt was transferred to SDU for cardizem drip. CXR concerning for aspiration/PNA. Type of Study: Bedside swallow evaluation Previous Swallow Assessment: none in chart Diet Prior to this Study: Regular;Thin liquids Temperature Spikes Noted: No Respiratory Status: Supplemental O2 delivered via (comment) ([place]) History of Recent Intubation: No Behavior/Cognition: Alert;Cooperative;Pleasant mood;Other (Comment) (  HOH) Oral Cavity - Dentition: Adequate natural dentition/normal for age Self-Feeding Abilities: Able to feed self Patient Positioning: Upright in bed Baseline Vocal Quality: Normal    Oral/Motor/Sensory Function Overall Oral Motor/Sensory Function: Appears within functional limits for tasks assessed   Ice Chips Ice chips: Not tested   Thin Liquid Thin Liquid: Impaired Presentation: Cup;Self  Fed Pharyngeal  Phase Impairments: Suspected delayed Swallow;Change in Vital Signs    Nectar Thick Nectar Thick Liquid: Impaired Presentation: Cup;Self Fed Pharyngeal Phase Impairments: Suspected delayed Swallow;Change in Vital Signs   Honey Thick Honey Thick Liquid: Not tested   Puree Puree: Within functional limits Presentation: Self Fed;Spoon   Solid   GO Functional Assessment Tool Used: skilled clinical judgment Functional Limitations: Swallowing Swallow Current Status (D9242): At least 20 percent but less than 40 percent impaired, limited or restricted Swallow Goal Status [phone]): At least 1 percent but less than 20 percent impaired, limited or restricted  Solid: Impaired Presentation: Self Fed Oral Phase Impairments: Impaired mastication Pharyngeal Phase Impairments: Cough - Immediate        Germain Osgood, M.A. CCC-SLP [phone]  Germain Osgood 04/22/2015,4:56 PM

## 2015-04-22 NOTE — Progress Notes (Signed)
  Echocardiogram 2D Echocardiogram has been performed.  Donata Clay 04/22/2015, 12:36 PM

## 2015-04-22 NOTE — Progress Notes (Signed)
PROGRESS NOTE  Kevin Rubio EKC:[id number] DOB: 1922/02/19 DOA: 04/21/2015 PCP: Nyoka Cowden, MD  HPI/Recap of past 24 hours:  In afib/rvr, heart rate 140's to 150's hgb 8 inr better  Assessment/Plan: Active Problems:   Hyperlipidemia   Atrial fibrillation   Hypothyroidism   Thrombocytopenia   Chronic anemia   Hemorrhage   Psoas hematoma, left, secondary to anticoagulant therapy   Pancytopenia  Left psoas and iliopsoas hematoma/Left hip pain -Seen on CT abdomen and pelvis -Trauma Surgery consulted and appreciated, -INR reversed (fresh frozen plasma and vitamin K ordered in the ED), hgb8.4 (baseline 10-11), transfuse 1uint prbc due to in afib/rvr.  Supratherapeutic INR -Likely complicated by recent abx use, off coumadin -INR being reversed with fresh frozen plasma and vitamin K -Will continue to monitor   Atrial fibrillation/RVR -initially in sinus rhythm, rate controlled, developed afib/rvr 8/31, heart rate in 140's to 150's -Continue metoprolol, start cardizem drip, transfer to stepdown, cardiology consulted -off Coumadin,Coumadin should be indefinitely held due to fall and bleeding risk  Volume overload: consider /dc ivf, consider lasix when bp stable  Splenomegaly with pancytopenia -Concerning for lymphoma -Per wife, patient has had several procedures including bone marrow biopsy in the past and follows up with Dr. Alen Blew every 6 months at the cancer center. (added Dr. Alen Blew to treatment team) -Will continue to monitor CBC   Recent urinary tract infection -Supposedly diagnosed on 04/20/2015 by PCP -UA today 04/21/2015 negative for infection -Patient was recently admitted and discharged 03/26/2015 with MSSA UTI as well as orchitis, patient was to follow-up with urology. At that time, patient was placed on Levaquin for 10 day course -Will hold antibiosis except this time.  Falls -Will consult PT and OT for evaluation and  treatment  Hypothyroidism -Continue Synthroid -TSH 7.97 on 04/20/2015  -We'll obtain free T4 level  Hyperlipidemia -Continue statin  DVT prophylaxis: SCDs  Code Status: DNR  Condition: Guarded   Disposition Plan: to stepdown due to initiation of cardizem drip   Consultants:  Cardiology  Trauma  Palliative care  Procedures:  prbc transfusion x1, held due to volume overload  ffp transfusion  Antibiotics: none   Objective: BP 103/59 mmHg  Pulse 87  Temp(Src) 97.3 F (36.3 C) (Oral)  Resp 33  Ht _0  (1.727 m)  Wt 156 lb 4.9 oz (70.9 kg)  BMI 23.77 kg/m2  SpO2 96%  Intake/Output Summary (Last 24 hours) at 04/22/15 1846 Last data filed at 04/22/15 1410  Gross per 24 hour  Intake 613.25 ml  Output    800 ml  Net -186.75 ml   Filed Weights   04/21/15 1135 04/22/15 0956  Weight: 145 lb (65.772 kg) 156 lb 4.9 oz (70.9 kg)    Exam:   General:  Frail, NAD  Cardiovascular: IRRR  Respiratory: CTABL  Abdomen: Soft/ND/NT, positive BS  Musculoskeletal: 2+ pitting Edema lower extremity  Neuro: alert and cooperative  Data Reviewed: Basic Metabolic Panel:  Recent Labs Lab 04/20/15 1524 04/21/15 1140 04/22/15 0600  NA 132* 131* 133*  K 4.0 4.1 3.3*  CL 103 105 106  CO2 23 21* 20*  GLUCOSE 107* 109* 81  BUN _1 CREATININE 0.97 1.01 0.77  CALCIUM 8.2* 7.8* 7.4*  MG  --   --  1.7   Liver Function Tests:  Recent Labs Lab 04/20/15 1524 04/21/15 1140  AST 23 31  ALT 19 18  ALKPHOS 110 99  BILITOT 0.8 0.7  PROT 7.5 6.3*  ALBUMIN 2.4*  1.8*   No results for input(s): LIPASE, AMYLASE in the last 168 hours. No results for input(s): AMMONIA in the last 168 hours. CBC:  Recent Labs Lab 04/20/15 1524 04/21/15 1140 04/21/15 2140 04/22/15 0218 04/22/15 0600 04/22/15 0917 04/22/15 1615  WBC 2.4 Repeated and verified X2.* 1.5*  --   --  1.2*  --   --   NEUTROABS 1.9 1.1*  --   --   --   --   --   HGB 10.7* 10.0* 8.5* 8.1*  8.4* 10.0* 9.2*  HCT 32.6* 30.0* 26.5* 25.6* 27.0* 31.0* 28.8*  MCV 94.4 94.3  --   --  94.7  --   --   PLT 113.0 Repeated and verified X2.* 90*  --   --  85*  --   --    Cardiac Enzymes:   No results for input(s): CKTOTAL, CKMB, CKMBINDEX, TROPONINI in the last 168 hours. BNP (last 3 results)  Recent Labs  09/17/14 1900 03/21/15 1635  BNP 302.1* 272.4*    ProBNP (last 3 results) No results for input(s): PROBNP in the last 8760 hours.  CBG: No results for input(s): GLUCAP in the last 168 hours.  Recent Results (from the past 240 hour(s))  Culture, blood (routine x 2)     Status: None (Preliminary result)   Collection Time: 04/21/15 11:40 AM  Result Value Ref Range Status   Specimen Description BLOOD LEFT FOREARM  Final   Special Requests BOTTLES DRAWN AEROBIC AND ANAEROBIC 5CC  Final   Culture NO GROWTH < 24 HOURS  Final   Report Status PENDING  Incomplete  Culture, blood (routine x 2)     Status: None (Preliminary result)   Collection Time: 04/21/15 11:45 AM  Result Value Ref Range Status   Specimen Description BLOOD RIGHT FOREARM  Final   Special Requests BOTTLES DRAWN AEROBIC AND ANAEROBIC 5CC  Final   Culture NO GROWTH < 24 HOURS  Final   Report Status PENDING  Incomplete  Urine culture     Status: None   Collection Time: 04/21/15 12:20 PM  Result Value Ref Range Status   Specimen Description URINE, RANDOM  Final   Special Requests NONE  Final   Culture NO GROWTH 1 DAY  Final   Report Status 04/22/2015 FINAL  Final  MRSA PCR Screening     Status: None   Collection Time: 04/22/15 10:00 AM  Result Value Ref Range Status   MRSA by PCR NEGATIVE NEGATIVE Final    Comment:        The GeneXpert MRSA Assay (FDA approved for NASAL specimens only), is one component of a comprehensive MRSA colonization surveillance program. It is not intended to diagnose MRSA infection nor to guide or monitor treatment for MRSA infections.      Studies: Dg Chest Port 1  View  04/22/2015   CLINICAL DATA:  81 year old male with wheezing. Initial encounter.  EXAM: PORTABLE CHEST - 1 VIEW  COMPARISON:  04/21/2015 and earlier.  FINDINGS: Portable AP semi upright view at 1434 hrs. New patchy and confluent bibasilar opacity slightly greater on the right. Stable cardiac size and mediastinal contours. No pneumothorax or pulmonary edema. No pleural effusion.  IMPRESSION: New patchy bibasilar opacity is nonspecific, consider aspiration/pneumonia in this setting. No pleural effusion identified.   Electronically Signed   By: Genevie Ann M.D.   On: 04/22/2015 14:39    Scheduled Meds: . sodium chloride  10 mL/hr Intravenous Once  . antiseptic oral rinse  7 mL Mouth Rinse BID  . [START ON 04/23/2015] cyanocobalamin  1,000 mcg Intramuscular Q30 days  . cycloSPORINE  1 drop Both Eyes BID  . furosemide  40 mg Intravenous Once  . ipratropium  0.5 mg Nebulization Q6H  . levalbuterol  0.63 mg Nebulization Q6H  . levothyroxine  25 mcg Oral QAC breakfast  . metoprolol tartrate  25 mg Oral BID  . multivitamin with minerals  1 tablet Oral Daily  . omega-3 acid ethyl esters  2 capsule Oral Daily  . pravastatin  40 mg Oral q1800  . sodium chloride  3 mL Intravenous Q12H  . sodium chloride  3 mL Intravenous Q12H  . tamsulosin  0.4 mg Oral Daily  . vitamin C  500 mg Oral Daily    Continuous Infusions: . diltiazem (CARDIZEM) infusion Stopped (04/22/15 1504)     Time spent: 25mns  Sheelah Ritacco MD, PhD  [facility] Pager [phone] If 7PM-7AM, please contact night-coverage at [email or web address], password [email or web address]/31/2016, 6:46 PM

## 2015-04-22 NOTE — Progress Notes (Signed)
OT Cancellation Note  Patient Details Name: DONDI AIME MRN: [id number] DOB: 1921-10-09   Cancelled Treatment:    Reason Eval/Treat Not Completed: Patient not medically ready. Pt transferred to step down unit after cardiac complications. Will hold OT for today and check back tomorrow for appropriateness of OT evaluation. Thank you for the order.  Ahlaya Ende , MS, OTR/L, CLT Pager: [phone]  04/22/2015, 11:06 AM

## 2015-04-22 NOTE — Progress Notes (Signed)
PT Cancellation Note  Patient Details Name: Kevin Rubio MRN: [id number] DOB: 05-05-1922   Cancelled Treatment:    Reason Eval/Treat Not Completed: Medical issues which prohibited therapy   Orders received and chart reviewed;  Per RN pt is experiencing cardiac complications and will be transferring to stepdown unit;   Will hold PT today and check back to morrow for appropriateness of  PT eval;  Roney Marion, [place] Pager [phone] Office [phone]    Roney Marion [facility] 04/22/2015, 9:08 AM

## 2015-04-23 DIAGNOSIS — S301XXA Contusion of abdominal wall, initial encounter: Secondary | ICD-10-CM | POA: Diagnosis not present

## 2015-04-23 DIAGNOSIS — W19XXXA Unspecified fall, initial encounter: Secondary | ICD-10-CM

## 2015-04-23 DIAGNOSIS — I480 Paroxysmal atrial fibrillation: Secondary | ICD-10-CM | POA: Diagnosis not present

## 2015-04-23 LAB — CBC
HEMATOCRIT: 27.9 % — AB (ref 39.0–52.0)
HEMOGLOBIN: 8.8 g/dL — AB (ref 13.0–17.0)
MCH: 29.3 pg (ref 26.0–34.0)
MCHC: 31.5 g/dL (ref 30.0–36.0)
MCV: 93 fL (ref 78.0–100.0)
PLATELETS: 92 10*3/uL — AB (ref 150–400)
RBC: 3 MIL/uL — AB (ref 4.22–5.81)
RDW: 17.4 % — ABNORMAL HIGH (ref 11.5–15.5)
WBC: 1.4 10*3/uL — AB (ref 4.0–10.5)

## 2015-04-23 LAB — BASIC METABOLIC PANEL
ANION GAP: 5 (ref 5–15)
BUN: 15 mg/dL (ref 6–20)
CHLORIDE: 103 mmol/L (ref 101–111)
CO2: 20 mmol/L — ABNORMAL LOW (ref 22–32)
Calcium: 7.5 mg/dL — ABNORMAL LOW (ref 8.9–10.3)
Creatinine, Ser: 0.89 mg/dL (ref 0.61–1.24)
GFR calc Af Amer: >60 mL/min (ref >60)
GLUCOSE: 123 mg/dL — AB (ref 65–99)
POTASSIUM: 4.2 mmol/L (ref 3.5–5.1)
Sodium: 128 mmol/L — ABNORMAL LOW (ref 135–145)

## 2015-04-23 LAB — PROTIME-INR
INR: 1.7 — ABNORMAL HIGH (ref 0.00–1.49)
Prothrombin Time: 20 seconds — ABNORMAL HIGH (ref 11.6–15.2)

## 2015-04-23 MED ORDER — RESOURCE THICKENUP CLEAR PO POWD
ORAL | Status: DC | PRN
  Filled 2015-04-23: qty 125

## 2015-04-23 NOTE — Progress Notes (Signed)
Came to assess pt and lay my eyes on pt, pt is resting comfortably at this time, slightly tachypneic, but his O2 saturations are stable at this time. No signs of distress noted.

## 2015-04-23 NOTE — Progress Notes (Signed)
    SUBJECTIVE:  No chest pain.  No SOB.     PHYSICAL EXAM Filed Vitals:   04/23/15 0700 04/23/15 0738 04/23/15 0832 04/23/15 1043  BP:  131/90  100/51  Pulse:  121  84  Temp: 97.8 F (36.6 C)     TempSrc: Oral     Resp:  18    Height:      Weight:      SpO2:  100% 100%    General:  No distress Lungs:  Clear Heart:  RRR Abdomen:  Positive bowel sounds, no rebound no guarding Extremities:  No edema  LABS: Lab Results  Component Value Date   TROPONINI 0.03 09/18/2014   Results for orders placed or performed during the hospital encounter of 04/21/15 (from the past 24 hour(s))  Hemoglobin and hematocrit, blood     Status: Abnormal   Collection Time: 04/22/15  4:15 PM  Result Value Ref Range   Hemoglobin 9.2 (L) 13.0 - 17.0 g/dL   HCT 28.8 (L) 39.0 - 52.0 %  CBC     Status: Abnormal   Collection Time: 04/23/15  2:57 AM  Result Value Ref Range   WBC 1.4 (LL) 4.0 - 10.5 K/uL   RBC 3.00 (L) 4.22 - 5.81 MIL/uL   Hemoglobin 8.8 (L) 13.0 - 17.0 g/dL   HCT 27.9 (L) 39.0 - 52.0 %   MCV 93.0 78.0 - 100.0 fL   MCH 29.3 26.0 - 34.0 pg   MCHC 31.5 30.0 - 36.0 g/dL   RDW 17.4 (H) 11.5 - 15.5 %   Platelets 92 (L) 150 - 400 K/uL  Basic metabolic panel     Status: Abnormal   Collection Time: 04/23/15  2:57 AM  Result Value Ref Range   Sodium 128 (L) 135 - 145 mmol/L   Potassium 4.2 3.5 - 5.1 mmol/L   Chloride 103 101 - 111 mmol/L   CO2 20 (L) 22 - 32 mmol/L   Glucose, Bld 123 (H) 65 - 99 mg/dL   BUN 15 6 - 20 mg/dL   Creatinine, Ser 0.89 0.61 - 1.24 mg/dL   Calcium 7.5 (L) 8.9 - 10.3 mg/dL   GFR calc non Af Amer >60 >60 mL/min   GFR calc Af Amer >60 >60 mL/min   Anion gap 5 5 - 15  Protime-INR     Status: Abnormal   Collection Time: 04/23/15  8:33 AM  Result Value Ref Range   Prothrombin Time 20.0 (H) 11.6 - 15.2 seconds   INR 1.70 (H) 0.00 - 1.49    Intake/Output Summary (Last 24 hours) at 04/23/15 1155 Last data filed at 04/23/15 1049  Gross per 24 hour  Intake  502.25 ml  Output    775 ml  Net -272.75 ml     ASSESSMENT AND PLAN:  ATRIAL FIB:   Off warfarin with the acute events.    Now back in NSR. No change in therapy.  He can be transferred out of the ICU and home when OK with [facility].  No further change in therapy or in patient evaluation.  The patient actually follows with Dr. Wynonia Lawman.       Jeneen Rinks [facility] 04/23/2015 11:55 AM

## 2015-04-23 NOTE — Evaluation (Signed)
Physical Therapy Evaluation Patient Details Name: Kevin Rubio MRN: [id number] DOB: [birth year] Today's Date: 04/23/2015   History of Present Illness  Pt with recent admission for UTI.@ 3 weeks ago. since that time pt has had 3 falls. Pt admitted with L hip pain. Pt with hematoma L hip. INR elevated on admission.   Clinical Impression  Pt admitted with/for falls, left hip pain due to hematoma to psoas..  Pt currently limited functionally due to the problems listed. ( See problems list.)   Pt will benefit from PT to maximize function and safety in order to get ready for next venue listed below.     Follow Up Recommendations SNF    Equipment Recommendations  Other (comment)    Recommendations for Other Services       Precautions / Restrictions Precautions Precautions: Fall      Mobility  Bed Mobility Overal bed mobility: Needs Assistance Bed Mobility: Sit to Supine       Sit to supine: Min assist;+2 for physical assistance   General bed mobility comments: assist to get legs into bed.  pt able to bridge weakly to center of the mattress.  Assisted to reposition  Transfers Overall transfer level: Needs assistance Equipment used: Rolling walker (2 wheeled) Transfers: Sit to/from [facility] Sit to Stand: Min assist;+2 physical assistance;+2 safety/equipment Stand pivot transfers: Min assist;+2 physical assistance;+2 safety/equipment       General transfer comment: cues for direction, use of the RW and stabilty assist  Ambulation/Gait Ambulation/Gait assistance: Min assist;+2 physical assistance Ambulation Distance (Feet): 2 Feet (forward and back further deferred by need to use the urinal.) Assistive device: Rolling walker (2 wheeled) Gait Pattern/deviations: Step-to pattern;Step-through pattern     General Gait Details: halted movement of LE, guarded posture  Stairs            Wheelchair Mobility    Modified Rankin (Stroke Patients Only)        Balance Overall balance assessment: Needs assistance Sitting-balance support: No upper extremity supported;Single extremity supported Sitting balance-Leahy Scale: Fair     Standing balance support: Bilateral upper extremity supported;Single extremity supported Standing balance-Leahy Scale: Poor Standing balance comment: needed stability assist in standing to use urinal                             Pertinent Vitals/Pain Pain Assessment: Faces Faces Pain Scale: Hurts even more Pain Location: L upper thigh area Pain Descriptors / Indicators: Sore Pain Intervention(s): Monitored during session;Repositioned    Home Living Family/patient expects to be discharged to:: Private residence Living Arrangements: Spouse/significant other Available Help at Discharge: Family;Available 24 hours/day Type of Home: House Home Access: Stairs to enter Entrance Stairs-Rails: None Entrance Stairs-Number of Steps: 1 Home Layout: One level Home Equipment: Shower seat - built in;Walker - 2 wheels      Prior Function Level of Independence: Independent with assistive device(s) (using RW after last hospitalization)               Hand Dominance        Extremity/Trunk Assessment   Upper Extremity Assessment: Defer to OT evaluation           Lower Extremity Assessment: Generalized weakness (limited formal testing)         Communication   Communication: HOH  Cognition Arousal/Alertness: Awake/alert Behavior During Therapy: Agitated;Anxious Overall Cognitive Status: Impaired/Different from baseline       Memory: Decreased short-term memory  General Comments      Exercises        Assessment/Plan    PT Assessment Patient needs continued PT services  PT Diagnosis Difficulty walking;Generalized weakness;Acute pain   PT Problem List Decreased strength;Decreased activity tolerance;Decreased balance;Decreased mobility;Decreased knowledge of  use of DME  PT Treatment Interventions DME instruction;Gait training;Functional mobility training;Therapeutic activities;Balance training;Patient/family education   PT Goals (Current goals can be found in the Care Plan section) Acute Rehab PT Goals Patient Stated Goal: pt did not participate, but wife was interested in getting pt to a more functional level before d/c to home. PT Goal Formulation: With patient Time For Goal Achievement: 05/07/15 Potential to Achieve Goals: Good    Frequency Min 3X/week   Barriers to discharge        Co-evaluation PT/OT/SLP Co-Evaluation/Treatment: Yes Reason for Co-Treatment: Complexity of the patient's impairments (multi-system involvement);For patient/therapist safety PT goals addressed during session: Mobility/safety with mobility         End of Session   Activity Tolerance: Patient tolerated treatment well;Patient limited by fatigue Patient left: in bed;with call bell/phone within reach;with family/visitor present Nurse Communication: Mobility status         Time: 1191-4782 PT Time Calculation (min) (ACUTE ONLY): 24 min   Charges:   PT Evaluation $Initial PT Evaluation Tier I: 1 Procedure     PT G Codes:        Jessee Mezera, Tessie Fass 04/23/2015, 4:59 PM 04/23/2015  Donnella Sham, PT [phone]  (pager)

## 2015-04-23 NOTE — Progress Notes (Signed)
PROGRESS NOTE  Kevin Rubio IRC:[id number] DOB: 1922-02-09 DOA: 04/21/2015 PCP: Nyoka Cowden, MD  HPI/Recap of past 24 hours:  Back normal sinus rhythm, feeling better, slight tachypneic, dry cough hgb stable inr 1.7  Assessment/Plan: Active Problems:   Hyperlipidemia   Atrial fibrillation   Hypothyroidism   Thrombocytopenia   Chronic anemia   Hemorrhage   Psoas hematoma, left, secondary to anticoagulant therapy   Pancytopenia  Left psoas and iliopsoas hematoma/Left hip pain -Seen on CT abdomen and pelvis -Trauma Surgery consulted and appreciated, -INR reversed (fresh frozen plasma and vitamin K ordered in the ED)  Supratherapeutic INR -Likely complicated by recent abx use, off coumadin -INR being reversed with fresh frozen plasma and vitamin K -inr 1.7 on 9/1   Atrial fibrillation/RVR -initially in sinus rhythm, rate controlled, developed afib/rvr 8/31, heart rate in 140's to 150's, transferred to stepdown, was on cardizem drip -now off cardizem drip, converted back to sinus rhythm on 9/1 , continue metoprolol, cardiology input appreciated -off Coumadin,Coumadin should be indefinitely held due to fall and bleeding risk  Volume overload: ivf d/ced, consider lasix when bp stable  Splenomegaly with pancytopenia -Concerning for lymphoma -Per wife, patient has had several procedures including bone marrow biopsy in the past and follows up with Dr. Alen Blew every 6 months at the cancer center. (added Dr. Alen Blew to treatment team) -Will continue to monitor CBC   Recent urinary tract infection -Supposedly diagnosed on 04/20/2015 by PCP -UA today 04/21/2015 negative for infection -Patient was recently admitted and discharged 03/26/2015 with MSSA UTI as well as orchitis, patient was to follow-up with urology. At that time, patient was placed on Levaquin for 10 day course -Will hold antibiosis except this time.  Falls - PT and OT for evaluation and  treatment  Hypothyroidism -Continue Synthroid -TSH 7.97 on 04/20/2015  -free T4 level wnl  Hyperlipidemia -Continue statin  DVT prophylaxis: SCDs  Code Status: DNR  Condition: Guarded   Disposition Plan:  Out of stepdown, to med tele, need snf   Consultants:  Cardiology  Trauma  Palliative care  Procedures:  prbc transfusion x1, held due to volume overload  ffp transfusion  Antibiotics: none   Objective: BP 118/45 mmHg  Pulse 76  Temp(Src) 97.7 F (36.5 C) (Oral)  Resp 24  Ht 5' 8"  (1.727 m)  Wt 156 lb 4.9 oz (70.9 kg)  BMI 23.77 kg/m2  SpO2 98%  Intake/Output Summary (Last 24 hours) at 04/23/15 1713 Last data filed at 04/23/15 1613  Gross per 24 hour  Intake    726 ml  Output    901 ml  Net   -175 ml   Filed Weights   04/21/15 1135 04/22/15 0956  Weight: 145 lb (65.772 kg) 156 lb 4.9 oz (70.9 kg)    Exam:   General:  Frail, NAD  Cardiovascular: RRR  Respiratory: CTABL  Abdomen: Soft/ND/NT, positive BS  Musculoskeletal: 2+ pitting Edema lower extremity  Neuro: alert and cooperative  Data Reviewed: Basic Metabolic Panel:  Recent Labs Lab 04/20/15 1524 04/21/15 1140 04/22/15 0600 04/23/15 0257  NA 132* 131* 133* 128*  K 4.0 4.1 3.3* 4.2  CL 103 105 106 103  CO2 23 21* 20* 20*  GLUCOSE 107* 109* 81 123*  BUN 21 17 11 15   CREATININE 0.97 1.01 0.77 0.89  CALCIUM 8.2* 7.8* 7.4* 7.5*  MG  --   --  1.7  --    Liver Function Tests:  Recent Labs Lab 04/20/15 1524 04/21/15 1140  AST 23 31  ALT 19 18  ALKPHOS 110 99  BILITOT 0.8 0.7  PROT 7.5 6.3*  ALBUMIN 2.4* 1.8*   No results for input(s): LIPASE, AMYLASE in the last 168 hours. No results for input(s): AMMONIA in the last 168 hours. CBC:  Recent Labs Lab 04/20/15 1524 04/21/15 1140  04/22/15 0218 04/22/15 0600 04/22/15 0917 04/22/15 1615 04/23/15 0257  WBC 2.4 Repeated and verified X2.* 1.5*  --   --  1.2*  --   --  1.4*  NEUTROABS 1.9 1.1*  --   --   --    --   --   --   HGB 10.7* 10.0*  < > 8.1* 8.4* 10.0* 9.2* 8.8*  HCT 32.6* 30.0*  < > 25.6* 27.0* 31.0* 28.8* 27.9*  MCV 94.4 94.3  --   --  94.7  --   --  93.0  PLT 113.0 Repeated and verified X2.* 90*  --   --  85*  --   --  92*  < > = values in this interval not displayed. Cardiac Enzymes:   No results for input(s): CKTOTAL, CKMB, CKMBINDEX, TROPONINI in the last 168 hours. BNP (last 3 results)  Recent Labs  09/17/14 1900 03/21/15 1635  BNP 302.1* 272.4*    ProBNP (last 3 results) No results for input(s): PROBNP in the last 8760 hours.  CBG: No results for input(s): GLUCAP in the last 168 hours.  Recent Results (from the past 240 hour(s))  Culture, blood (routine x 2)     Status: None (Preliminary result)   Collection Time: 04/21/15 11:40 AM  Result Value Ref Range Status   Specimen Description BLOOD LEFT FOREARM  Final   Special Requests BOTTLES DRAWN AEROBIC AND ANAEROBIC 5CC  Final   Culture NO GROWTH 2 DAYS  Final   Report Status PENDING  Incomplete  Culture, blood (routine x 2)     Status: None (Preliminary result)   Collection Time: 04/21/15 11:45 AM  Result Value Ref Range Status   Specimen Description BLOOD RIGHT FOREARM  Final   Special Requests BOTTLES DRAWN AEROBIC AND ANAEROBIC 5CC  Final   Culture NO GROWTH 2 DAYS  Final   Report Status PENDING  Incomplete  Urine culture     Status: None   Collection Time: 04/21/15 12:20 PM  Result Value Ref Range Status   Specimen Description URINE, RANDOM  Final   Special Requests NONE  Final   Culture NO GROWTH 1 DAY  Final   Report Status 04/22/2015 FINAL  Final  MRSA PCR Screening     Status: None   Collection Time: 04/22/15 10:00 AM  Result Value Ref Range Status   MRSA by PCR NEGATIVE NEGATIVE Final    Comment:        The GeneXpert MRSA Assay (FDA approved for NASAL specimens only), is one component of a comprehensive MRSA colonization surveillance program. It is not intended to diagnose MRSA infection  nor to guide or monitor treatment for MRSA infections.      Studies: No results found.  Scheduled Meds: . sodium chloride  10 mL/hr Intravenous Once  . antiseptic oral rinse  7 mL Mouth Rinse BID  . cyanocobalamin  1,000 mcg Intramuscular Q30 days  . cycloSPORINE  1 drop Both Eyes BID  . furosemide  40 mg Intravenous Once  . ipratropium  0.5 mg Nebulization Q6H  . levalbuterol  0.63 mg Nebulization Q6H  . levothyroxine  25 mcg Oral QAC breakfast  .  metoprolol tartrate  25 mg Oral BID  . multivitamin with minerals  1 tablet Oral Daily  . omega-3 acid ethyl esters  2 capsule Oral Daily  . pravastatin  40 mg Oral q1800  . sodium chloride  3 mL Intravenous Q12H  . sodium chloride  3 mL Intravenous Q12H  . tamsulosin  0.4 mg Oral Daily  . vitamin C  500 mg Oral Daily    Continuous Infusions:     Time spent: 20mns  Mariona Scholes MD, PhD  [facility] Pager [phone] If 7PM-7AM, please contact night-coverage at [email or web address], password [email or web address]/08/2014, 5:13 PM

## 2015-04-23 NOTE — Progress Notes (Signed)
Pt refuse his 2am breathing treatment. Family at bedside, wife wanted him to rest this evening.

## 2015-04-23 NOTE — Progress Notes (Signed)
Occupational Therapy Evaluation Patient Details Name: Kevin Rubio MRN: [id number] DOB: 11-04-1921 Today's Date: 04/23/2015    History of Present Illness Pt with recent admission for UTI.@ 3 weeks ago. since that time pt has had 3 falls. Pt admitted with L hip pain. Pt with hematoma L hip. INR elevated on admission.    Clinical Impression   PTA, pt has experienced multiple falls in past 3 weeks. Pt is extremely high fall risk. Dyspnea 2/4. RR increased to 33 with minimal activity. O2 sats stable RA. Pt requires assistance with mobility and ADL and would benefit from rehab at SNF to facilitate safe C/C home with wife. Will follow acutely to address established goals.     Follow Up Recommendations  SNF;Supervision/Assistance - 24 hour    Equipment Recommendations  3 in 1 bedside comode    Recommendations for Other Services       Precautions / Restrictions Precautions Precautions: Fall      Mobility Bed Mobility Overal bed mobility: Needs Assistance Bed Mobility: Sit to Supine       Sit to supine: Min assist;+2 for physical assistance   General bed mobility comments: assist to get legs into bed.  Required physical assistance to reposition in bed. .  Assisted to reposition  Transfers Overall transfer level: Needs assistance Equipment used: Rolling walker (2 wheeled) Transfers: Sit to/from [employer] Transfers Sit to Stand: Min assist;+2 physical assistance;+2 safety/equipment Stand pivot transfers: Min assist;+2 physical assistance;+2 safety/equipment       General transfer comment: cues for direction, use of the RW and stabilty assist    Balance Overall balance assessment: Needs assistance Sitting-balance support: No upper extremity supported;Single extremity supported Sitting balance-Leahy Scale: Fair     Standing balance support: Bilateral upper extremity supported;Single extremity supported Standing balance-Leahy Scale: Poor Standing balance comment:  needed stability assist in standing to use urinal; posterior lean.                             ADL Overall ADL's : Needs assistance/impaired     Grooming: Set up;Sitting   Upper Body Bathing: Set up;Sitting   Lower Body Bathing: Moderate assistance;Sit to/from stand   Upper Body Dressing : Minimal assistance;Sitting   Lower Body Dressing: Moderate assistance;Sit to/from stand   Toilet Transfer: RW;Minimal assistance   Toileting- Water quality scientist and Hygiene: Minimal assistance;Sit to/from stand       Functional mobility during ADLs: Rolling walker;Minimal assistance General ADL Comments: Pt with poor endurance. Fatigues with minimal activity. Significant posterior lean and extremely high fall risk. Poor insight into how his deficits affect his functional capacity. Increased RR with activity.      Vision     Perception     Praxis      Pertinent Vitals/Pain Pain Assessment: Faces Faces Pain Scale: Hurts even more Pain Location: L upper thigh area Pain Descriptors / Indicators: Sore Pain Intervention(s): Monitored during session;Repositioned     Hand Dominance     Extremity/Trunk Assessment Upper Extremity Assessment Upper Extremity Assessment: Defer to OT evaluation   Lower Extremity Assessment Lower Extremity Assessment: Generalized weakness (limited formal testing)   Cervical / Trunk Assessment Cervical / Trunk Assessment: Kyphotic   Communication Communication Communication: HOH   Cognition Arousal/Alertness: Awake/alert Behavior During Therapy: Agitated;Anxious Overall Cognitive Status: Impaired/Different from baseline Area of Impairment: Attention;Memory;Following commands;Safety/judgement;Awareness;Problem solving   Current Attention Level: Selective Memory: Decreased short-term memory Following Commands: Follows one step commands consistently Safety/Judgement:  Decreased awareness of safety;Decreased awareness of  deficits Awareness: Emergent Problem Solving: Slow processing;Difficulty sequencing General Comments: wife present.Pt with apparent cognitive deficits. Unsure if this is basleine. Pt perseverating on collecting his urine throughout session.    General Comments       Exercises       Shoulder Instructions      Home Living Family/patient expects to be discharged to:: Private residence Living Arrangements: Spouse/significant other Available Help at Discharge: Family;Available 24 hours/day Type of Home: House Home Access: Stairs to enter [employer] of Steps: 1 Entrance Stairs-Rails: None Home Layout: One level     Bathroom Shower/Tub: Occupational psychologist: Standard     Home Equipment: Shower seat - built in;Walker - 2 wheels          Prior Functioning/Environment Level of Independence: Independent with assistive device(s) (using RW after last hospitalization)             OT Diagnosis: Generalized weakness;Cognitive deficits;Acute pain   OT Problem List: Decreased strength;Decreased range of motion;Decreased activity tolerance;Impaired balance (sitting and/or standing);Decreased safety awareness;Decreased knowledge of use of DME or AE;Cardiopulmonary status limiting activity;Pain   OT Treatment/Interventions: Self-care/ADL training;Therapeutic exercise;Energy conservation;DME and/or AE instruction;Therapeutic activities;Patient/family education;Balance training    OT Goals(Current goals can be found in the care plan section) Acute Rehab OT Goals Patient Stated Goal: pt did not participate, but wife was interested in getting pt to a more functional level before d/c to home. OT Goal Formulation: With patient/family Time For Goal Achievement: 05/07/15 Potential to Achieve Goals: Good ADL Goals Pt Will Perform Lower Body Bathing: with set-up;with supervision;sit to/from stand Pt Will Perform Lower Body Dressing: with set-up;with supervision;sit  to/from stand Pt Will Transfer to Toilet: with supervision;ambulating;bedside commode Pt Will Perform Toileting - Clothing Manipulation and hygiene: with supervision;sit to/from stand  OT Frequency: Min 2X/week   Barriers to D/C: Other (comment) (elderly wife only support)          Co-evaluation   Reason for Co-Treatment: Complexity of the patient's impairments (multi-system involvement);For patient/therapist safety PT goals addressed during session: Mobility/safety with mobility        End of Session Equipment Utilized During Treatment: Gait belt;Rolling walker Nurse Communication: Mobility status  Activity Tolerance: Patient tolerated treatment well Patient left: in bed;with call bell/phone within reach;with bed alarm set;with family/visitor present   Time: 8882-8003 OT Time Calculation (min): 25 min Charges:  OT Evaluation $Initial OT Evaluation Tier I: 1 Procedure G-Codes: OT G-codes **NOT FOR INPATIENT CLASS** Functional Assessment Tool Used: clinical judgement Functional Limitation: Self care Self Care Current Status (K9179): At least 40 percent but less than 60 percent impaired, limited or restricted Self Care Goal Status (X5056): At least 1 percent but less than 20 percent impaired, limited or restricted  Analucia Hush,HILLARY 04/23/2015, 5:17 PM   [facility], OTR/L  [phone] 04/23/2015

## 2015-04-23 NOTE — Progress Notes (Signed)
Speech Language Pathology Treatment: Dysphagia  Patient Details Name: Kevin Rubio MRN: [id number] DOB: 09/30/1921 Today's Date: 04/23/2015 Time: 0017-4944 SLP Time Calculation (min) (ACUTE ONLY): 20 min  Assessment / Plan / Recommendation Clinical Impression  Pt tolerated nectar thick liquids but had difficulty with bolus formation of a peanut butter cracker due to dry mouth (wife confirmed history). Per RN report, pt coughed frequently with mech soft breakfast, will change to dysphagia 2 (minced) diet. SLP provided the pt with tactile cues and educated the pt and wife on safe swallow strategies (small sips, no straw, and follow solids with liquid). SLP will f/u with diet check, pt's prognosis for safe swallowing is good and will likely improve as his overall health improves.    HPI Other Pertinent Information: Mr. Hanak is a 81 yo male with PMHx of chronic atrial fibrillation (off coumadin d/t fall risk), hypothyroidism, CAD, HTN and pancytopenia concerning for malignancy who presented to the ED on 8/30 with left hip pain after a fall. Patient was found to have a left psoas and iliopsoas hematoma and a supratherapeutic INR >7. Pt was transferred to SDU for cardizem drip. CXR concerning for aspiration/PNA.   Pertinent Vitals Pain Assessment: Faces Faces Pain Scale: No hurt  SLP Plan  Continue with current plan of care    Recommendations Diet recommendations: Dysphagia 2 (fine chop);Nectar-thick liquid Liquids provided via: Cup;No straw Medication Administration: Whole meds with puree Supervision: Patient able to self feed (set up assist) Compensations: Slow rate;Small sips/bites;Follow solids with liquid Postural Changes and/or Swallow Maneuvers: Seated upright 90 degrees              Oral Care Recommendations: Oral care BID Plan: Continue with current plan of care    [place], Student-SLP  Lanier Ensign 04/23/2015, 11:50 AM

## 2015-04-24 DIAGNOSIS — W19XXXA Unspecified fall, initial encounter: Secondary | ICD-10-CM | POA: Diagnosis not present

## 2015-04-24 DIAGNOSIS — I480 Paroxysmal atrial fibrillation: Secondary | ICD-10-CM | POA: Diagnosis not present

## 2015-04-24 DIAGNOSIS — D61818 Other pancytopenia: Secondary | ICD-10-CM | POA: Diagnosis not present

## 2015-04-24 DIAGNOSIS — S301XXA Contusion of abdominal wall, initial encounter: Secondary | ICD-10-CM | POA: Diagnosis not present

## 2015-04-24 LAB — BASIC METABOLIC PANEL
Anion gap: 4 — ABNORMAL LOW (ref 5–15)
BUN: 14 mg/dL (ref 6–20)
CALCIUM: 7.6 mg/dL — AB (ref 8.9–10.3)
CO2: 22 mmol/L (ref 22–32)
CREATININE: 0.87 mg/dL (ref 0.61–1.24)
Chloride: 104 mmol/L (ref 101–111)
GFR calc non Af Amer: >60 mL/min (ref >60)
Glucose, Bld: 101 mg/dL — ABNORMAL HIGH (ref 65–99)
Potassium: 3.8 mmol/L (ref 3.5–5.1)
SODIUM: 130 mmol/L — AB (ref 135–145)

## 2015-04-24 LAB — CBC
HCT: 26.4 % — ABNORMAL LOW (ref 39.0–52.0)
Hemoglobin: 8.6 g/dL — ABNORMAL LOW (ref 13.0–17.0)
MCH: 30 pg (ref 26.0–34.0)
MCHC: 32.6 g/dL (ref 30.0–36.0)
MCV: 92 fL (ref 78.0–100.0)
Platelets: 94 10*3/uL — ABNORMAL LOW (ref 150–400)
RBC: 2.87 MIL/uL — ABNORMAL LOW (ref 4.22–5.81)
RDW: 17.3 % — AB (ref 11.5–15.5)
WBC: 1.4 10*3/uL — CL (ref 4.0–10.5)

## 2015-04-24 LAB — MAGNESIUM: MAGNESIUM: 1.7 mg/dL (ref 1.7–2.4)

## 2015-04-24 MED ORDER — LEVALBUTEROL HCL 0.63 MG/3ML IN NEBU
0.6300 mg | INHALATION_SOLUTION | Freq: Three times a day (TID) | RESPIRATORY_TRACT | Status: DC
  Administered 2015-04-24 – 2015-04-26 (×7): 0.63 mg via RESPIRATORY_TRACT
  Filled 2015-04-24 (×7): qty 3

## 2015-04-24 MED ORDER — IPRATROPIUM BROMIDE 0.02 % IN SOLN
0.5000 mg | Freq: Three times a day (TID) | RESPIRATORY_TRACT | Status: DC
  Administered 2015-04-24 – 2015-04-26 (×7): 0.5 mg via RESPIRATORY_TRACT
  Filled 2015-04-24 (×7): qty 2.5

## 2015-04-24 MED ORDER — POTASSIUM CHLORIDE CRYS ER 20 MEQ PO TBCR
40.0000 meq | EXTENDED_RELEASE_TABLET | Freq: Once | ORAL | Status: AC
  Administered 2015-04-24: 40 meq via ORAL
  Filled 2015-04-24: qty 2

## 2015-04-24 MED ORDER — FUROSEMIDE 10 MG/ML IJ SOLN
40.0000 mg | Freq: Once | INTRAMUSCULAR | Status: AC
  Administered 2015-04-24: 40 mg via INTRAVENOUS

## 2015-04-24 MED ORDER — MAGNESIUM SULFATE 2 GM/50ML IV SOLN
2.0000 g | Freq: Once | INTRAVENOUS | Status: AC
  Administered 2015-04-24: 2 g via INTRAVENOUS
  Filled 2015-04-24: qty 50

## 2015-04-24 MED ORDER — AMIODARONE HCL 200 MG PO TABS
400.0000 mg | ORAL_TABLET | Freq: Two times a day (BID) | ORAL | Status: DC
  Administered 2015-04-24 – 2015-04-26 (×5): 400 mg via ORAL
  Filled 2015-04-24 (×5): qty 2

## 2015-04-24 MED ORDER — METOPROLOL TARTRATE 50 MG PO TABS
50.0000 mg | ORAL_TABLET | Freq: Two times a day (BID) | ORAL | Status: DC
  Administered 2015-04-24 – 2015-04-26 (×4): 50 mg via ORAL
  Filled 2015-04-24 (×4): qty 1

## 2015-04-24 MED ORDER — METOPROLOL TARTRATE 50 MG PO TABS: 50.0000 mg | ORAL_TABLET | Freq: Two times a day (BID) | ORAL | Status: DC

## 2015-04-24 NOTE — Progress Notes (Addendum)
CSW (Clinical Education officer, museum) offered pt wife bed offers. Pt wife unsure of which facility she would like pt to discharge to. CSW arranged for pt wife to meet with [facility] representative at bedside today. CSW also spoke with MD and notified of CSW concerns about pt wife understanding of pt condition. CSW also notified MD that pt has bed available at SNF today or over weekend if medically stable for dc.   ADDENDUM 4:30pm: CSW notified by [facility] representative that pt wife not very receptive to visit and stated she would like [facility]. CSW visited pt wife and notified again that [facility] is not able to offer a bed right now. CSW reviewed current bed options with pt wife again. Pt wife still unable to provide decision or even narrow down options. CSW did inform pt wife that plan may be for dc tomorrow and that weekend CSW will follow up in the morning. Pt wife is aware that if she does not make a decision in a timely manner and pt is discharged it is possible pt will have to dc home.   [place], [place]

## 2015-04-24 NOTE — Progress Notes (Signed)
Pt back in A-fib heart rate 120-130's this morning since 0701 am. Pt is asymptomatic. Md notified. Will continue to monitor.  Ferdinand Lango, RN

## 2015-04-24 NOTE — Progress Notes (Signed)
Pt still in A-fib with an elevated HR fluctuating between 120-140, attending physician and Cardilology PA were notified. Pt has no new complaint. Will continue to monnitor closely.  Ferdinand Lango, RN

## 2015-04-24 NOTE — Clinical Social Work Note (Signed)
Clinical Social Work Assessment  Patient Details  Name: Kevin Rubio MRN: [id number] Date of Birth: 1922/07/30  Date of referral:  04/24/15               Reason for consult:  Facility Placement                Permission sought to share information with:  Chartered certified accountant granted to share information::  Yes, Verbal Permission Granted (Granted by pt wife)  Name::        Agency::  SNFs for referral purposes  Relationship::     Contact Information:     Housing/Transportation Living arrangements for the past 2 months:  Single Family Home Source of Information:  Spouse Patient Interpreter Needed:  None Criminal Activity/Legal Involvement Pertinent to Current Situation/Hospitalization:  No - Comment as needed Significant Relationships:  Spouse Lives with:  Spouse Do you feel safe going back to the place where you live?  No (Pt wife unsure she can manage pt care at home) Need for family participation in patient care:  Yes (Comment)  Care giving concerns:  Recommendation for SNF given pt limited mobility   Social Worker assessment / plan:  CSW visited pt room and spoke with pt wife at bedside. She informed CSW she was unaware of recommendations and has not been able to speak with MD. CSW informed of PT recommendations at this time. Pt wife understanding and confirmed she feels pt is not at baseline. Pt wife expressed concerns for managing care at home even with resumed HH. CSW explained SNF referral process. Per pt wife, pt has not been to SNF before and she is unfamiliar with facilities. Pt wife agreeable to referral being sent to all [facility].   Employment status:  Retired Nurse, adult PT Recommendations:  [place] / Referral to community resources:  [place]  Patient/Family's Response to care:  Pt wife in agreement that pt will benefit from rehab at [facility]  Patient/Family's  Understanding of and Emotional Response to Diagnosis, Current Treatment, and Prognosis:  Pt wife has a limited understanding of pt condition. She states she has not spoken with MD so unsure if this is the reason or if she is not processing information. Pt wife expressed that pt hospitalization has been overwhelming for her.  Emotional Assessment Appearance:  Appears stated age, Well-Groomed Attitude/Demeanor/Rapport:  Unable to Assess Affect (typically observed):  Unable to Assess Orientation:  Oriented to Self, Oriented to Place, Oriented to  Time, Oriented to Situation (Documented that pt is Orientedx4 however pt difficult to engage and not involved in CSW assessment) Alcohol / Substance use:  Not Applicable Psych involvement (Current and /or in the community):  No (Comment)  Discharge Needs  Concerns to be addressed:  Discharge Planning Concerns Readmission within the last 30 days:  Yes Current discharge risk:  Dependent with Mobility, Chronically ill Barriers to Discharge:  Continued Medical Work up   [employer], [place]

## 2015-04-24 NOTE — Progress Notes (Signed)
PROGRESS NOTE  Kevin Rubio TML:[id number] DOB: February 21, 1922 DOA: 04/21/2015 PCP: Nyoka Cowden, MD  HPI/Recap of past 24 hours:  Back to afib with RVR again,  slight tachypneic, denies chest pain hgb stable, wife and daughter in law   Assessment/Plan: Active Problems:   Hyperlipidemia   Atrial fibrillation   Hypothyroidism   Thrombocytopenia   Chronic anemia   Hemorrhage   Psoas hematoma, left, secondary to anticoagulant therapy   Pancytopenia  Left psoas and iliopsoas hematoma/Left hip pain -Seen on CT abdomen and pelvis -Trauma Surgery consulted and appreciated, -INR reversed (fresh frozen plasma and vitamin K ordered in the ED)  Supratherapeutic INR -Likely complicated by recent abx use, off coumadin -INR being reversed with fresh frozen plasma and vitamin K -inr 1.7 on 9/1   Atrial fibrillation/RVR -initially in sinus rhythm, rate controlled, developed afib/rvr 8/31, heart rate in 140's to 150's, transferred to stepdown, was on cardizem drip -now off cardizem drip, converted back to sinus rhythm on 9/1 , continue metoprolol, cardiology input appreciated -off Coumadin,Coumadin should be indefinitely held due to fall and bleeding risk -back to afib /rvr on 9/2, cardiology started amiodarone, will follow cards recommendations.  Volume overload: ivf d/ced, lasix 51m iv on 9/2  Splenomegaly with pancytopenia -Concerning for lymphoma -Per wife, patient has had several procedures including bone marrow biopsy in the past and follows up with Dr. SAlen Blewevery 6 months at the cancer center. (added Dr. SAlen Blewto treatment team) -Will continue to monitor CBC   Recent urinary tract infection -Supposedly diagnosed on 04/20/2015 by PCP -UA today 04/21/2015 negative for infection -Patient was recently admitted and discharged 03/26/2015 with MSSA UTI as well as orchitis, patient was to follow-up with urology. At that time, patient was placed on Levaquin for 10 day  course -due to neutropenia, patient is at risk of having recurrent infection  Falls/FTT - PT and OT for evaluation and treatment/ SNF -family reported patient has been very independent until a months ago, he became progressively weak and fell several times after  after his urinary tract infection, patient's wife is hopeful that patient can recover and back to his normal self a month ago, discussed with patient's wife and daughter in law, patient has showed signed of continued decline, it is less likely that patient to make a full recover, discussed palliative team consult , they agree. Consult placed in.   Hypothyroidism -Continue Synthroid -TSH 7.97 on 04/20/2015  -free T4 level wnl  Hyperlipidemia -Continue statin  DVT prophylaxis: SCDs  Code Status: DNR  Condition: Guarded   Disposition Plan:  Goal of care discussion to be held by palliative care team, likely this weekend,    Consultants:  Cardiology  Trauma  Palliative care  Procedures:  prbc transfusion x1, held due to volume overload  ffp transfusion  Antibiotics: none   Objective: BP 124/70 mmHg  Pulse 136  Temp(Src) 98.7 F (37.1 C) (Oral)  Resp 24  Ht 5' 8"  (1.727 m)  Wt 162 lb 4.1 oz (73.6 kg)  BMI 24.68 kg/m2  SpO2 96%  Intake/Output Summary (Last 24 hours) at 04/24/15 1831 Last data filed at 04/24/15 0821  Gross per 24 hour  Intake    380 ml  Output    650 ml  Net   -270 ml   Filed Weights   04/21/15 1135 04/22/15 0956 04/24/15 0430  Weight: 145 lb (65.772 kg) 156 lb 4.9 oz (70.9 kg) 162 lb 4.1 oz (73.6 kg)    Exam:  General:  Frail, NAD  Cardiovascular: RRR  Respiratory: CTABL  Abdomen: Soft/ND/NT, positive BS  Musculoskeletal: 2+ pitting Edema lower extremity  Neuro: alert and cooperative  Data Reviewed: Basic Metabolic Panel:  Recent Labs Lab 04/20/15 1524 04/21/15 1140 04/22/15 0600 04/23/15 0257 04/24/15 0525  NA 132* 131* 133* 128* 130*  K 4.0 4.1 3.3* 4.2  3.8  CL 103 105 106 103 104  CO2 23 21* 20* 20* 22  GLUCOSE 107* 109* 81 123* 101*  BUN 21 17 11 15 14   CREATININE 0.97 1.01 0.77 0.89 0.87  CALCIUM 8.2* 7.8* 7.4* 7.5* 7.6*  MG  --   --  1.7  --  1.7   Liver Function Tests:  Recent Labs Lab 04/20/15 1524 04/21/15 1140  AST 23 31  ALT 19 18  ALKPHOS 110 99  BILITOT 0.8 0.7  PROT 7.5 6.3*  ALBUMIN 2.4* 1.8*   No results for input(s): LIPASE, AMYLASE in the last 168 hours. No results for input(s): AMMONIA in the last 168 hours. CBC:  Recent Labs Lab 04/20/15 1524 04/21/15 1140  04/22/15 0600 04/22/15 0917 04/22/15 1615 04/23/15 0257 04/24/15 0525  WBC 2.4 Repeated and verified X2.* 1.5*  --  1.2*  --   --  1.4* 1.4*  NEUTROABS 1.9 1.1*  --   --   --   --   --   --   HGB 10.7* 10.0*  < > 8.4* 10.0* 9.2* 8.8* 8.6*  HCT 32.6* 30.0*  < > 27.0* 31.0* 28.8* 27.9* 26.4*  MCV 94.4 94.3  --  94.7  --   --  93.0 92.0  PLT 113.0 Repeated and verified X2.* 90*  --  85*  --   --  92* 94*  < > = values in this interval not displayed. Cardiac Enzymes:   No results for input(s): CKTOTAL, CKMB, CKMBINDEX, TROPONINI in the last 168 hours. BNP (last 3 results)  Recent Labs  09/17/14 1900 03/21/15 1635  BNP 302.1* 272.4*    ProBNP (last 3 results) No results for input(s): PROBNP in the last 8760 hours.  CBG: No results for input(s): GLUCAP in the last 168 hours.  Recent Results (from the past 240 hour(s))  Culture, blood (routine x 2)     Status: None (Preliminary result)   Collection Time: 04/21/15 11:40 AM  Result Value Ref Range Status   Specimen Description BLOOD LEFT FOREARM  Final   Special Requests BOTTLES DRAWN AEROBIC AND ANAEROBIC 5CC  Final   Culture NO GROWTH 3 DAYS  Final   Report Status PENDING  Incomplete  Culture, blood (routine x 2)     Status: None (Preliminary result)   Collection Time: 04/21/15 11:45 AM  Result Value Ref Range Status   Specimen Description BLOOD RIGHT FOREARM  Final   Special  Requests BOTTLES DRAWN AEROBIC AND ANAEROBIC 5CC  Final   Culture NO GROWTH 3 DAYS  Final   Report Status PENDING  Incomplete  Urine culture     Status: None   Collection Time: 04/21/15 12:20 PM  Result Value Ref Range Status   Specimen Description URINE, RANDOM  Final   Special Requests NONE  Final   Culture NO GROWTH 1 DAY  Final   Report Status 04/22/2015 FINAL  Final  MRSA PCR Screening     Status: None   Collection Time: 04/22/15 10:00 AM  Result Value Ref Range Status   MRSA by PCR NEGATIVE NEGATIVE Final    Comment:  The GeneXpert MRSA Assay (FDA approved for NASAL specimens only), is one component of a comprehensive MRSA colonization surveillance program. It is not intended to diagnose MRSA infection nor to guide or monitor treatment for MRSA infections.      Studies: No results found.  Scheduled Meds: . sodium chloride  10 mL/hr Intravenous Once  . amiodarone  400 mg Oral BID  . antiseptic oral rinse  7 mL Mouth Rinse BID  . cyanocobalamin  1,000 mcg Intramuscular Q30 days  . cycloSPORINE  1 drop Both Eyes BID  . furosemide  40 mg Intravenous Once  . ipratropium  0.5 mg Nebulization TID  . levalbuterol  0.63 mg Nebulization TID  . levothyroxine  25 mcg Oral QAC breakfast  . metoprolol tartrate  50 mg Oral BID  . multivitamin with minerals  1 tablet Oral Daily  . omega-3 acid ethyl esters  2 capsule Oral Daily  . pravastatin  40 mg Oral q1800  . sodium chloride  3 mL Intravenous Q12H  . sodium chloride  3 mL Intravenous Q12H  . tamsulosin  0.4 mg Oral Daily  . vitamin C  500 mg Oral Daily    Continuous Infusions:     Time spent: 79mns  Dream Nodal MD, PhD  [facility] Pager [phone] If 7PM-7AM, please contact night-coverage at [email or web address], password [email or web address]9/09/2014, 6:31 PM

## 2015-04-24 NOTE — Progress Notes (Signed)
Paged by nurse, rate still in 120s. Started amio 400mg  BID today. Will increased metoprolol to 50mg  BID and reassess in AM. BP stable at 124/70. Hold of SBP less than 90.  Karmella Bouvier, [place]

## 2015-04-24 NOTE — Progress Notes (Signed)
    SUBJECTIVE:  No chest pain.  No SOB.     PHYSICAL EXAM Filed Vitals:   04/24/15 0425 04/24/15 0430 04/24/15 0805 04/24/15 0819  BP: 100/40  124/70   Pulse: 78  136   Temp: 98.7 F (37.1 C)     TempSrc: Oral     Resp: 25  24   Height:      Weight:  162 lb 4.1 oz (73.6 kg)    SpO2: 89%  93% 98%   General:  No distress Lungs:  Clear Heart:  Irregular Abdomen:  Positive bowel sounds, no rebound no guarding Extremities:  No edema  LABS: Lab Results  Component Value Date   TROPONINI 0.03 09/18/2014   Results for orders placed or performed during the hospital encounter of 04/21/15 (from the past 24 hour(s))  CBC     Status: Abnormal   Collection Time: 04/24/15  5:25 AM  Result Value Ref Range   WBC 1.4 (LL) 4.0 - 10.5 K/uL   RBC 2.87 (L) 4.22 - 5.81 MIL/uL   Hemoglobin 8.6 (L) 13.0 - 17.0 g/dL   HCT 26.4 (L) 39.0 - 52.0 %   MCV 92.0 78.0 - 100.0 fL   MCH 30.0 26.0 - 34.0 pg   MCHC 32.6 30.0 - 36.0 g/dL   RDW 17.3 (H) 11.5 - 15.5 %   Platelets 94 (L) 150 - 400 K/uL  Basic metabolic panel     Status: Abnormal   Collection Time: 04/24/15  5:25 AM  Result Value Ref Range   Sodium 130 (L) 135 - 145 mmol/L   Potassium 3.8 3.5 - 5.1 mmol/L   Chloride 104 101 - 111 mmol/L   CO2 22 22 - 32 mmol/L   Glucose, Bld 101 (H) 65 - 99 mg/dL   BUN 14 6 - 20 mg/dL   Creatinine, Ser 0.87 0.61 - 1.24 mg/dL   Calcium 7.6 (L) 8.9 - 10.3 mg/dL   GFR calc non Af Amer >60 >60 mL/min   GFR calc Af Amer >60 >60 mL/min   Anion gap 4 (L) 5 - 15  Magnesium     Status: None   Collection Time: 04/24/15  5:25 AM  Result Value Ref Range   Magnesium 1.7 1.7 - 2.4 mg/dL    Intake/Output Summary (Last 24 hours) at 04/24/15 0951 Last data filed at 04/24/15 1610  Gross per 24 hour  Intake    306 ml  Output    875 ml  Net   -569 ml     ASSESSMENT AND PLAN:  ATRIAL FIB:   Off warfarin with the acute events.    Now back in atrial fib.  I am going to start PO amiodarone.  He has rapid rate  though without significant symptoms when this occurs.        Kevin Rubio [facility] 04/24/2015 9:51 AM

## 2015-04-24 NOTE — Clinical Social Work Placement (Signed)
   CLINICAL SOCIAL WORK PLACEMENT  NOTE  Date:  04/24/2015  Patient Details  Name: Kevin Rubio MRN: [id number] Date of Birth: [birth year]  Clinical Social Work is seeking post-discharge placement for this patient at the [place] level of care (*CSW will initial, date and re-position this form in  chart as items are completed):  Yes   Patient/family provided with [place] Work Department's list of facilities offering this level of care within the geographic area requested by the patient (or if unable, by the patient's family).  Yes   Patient/family informed of their freedom to choose among providers that offer the needed level of care, that participate in Medicare, Medicaid or managed care program needed by the patient, have an available bed and are willing to accept the patient.  Yes   Patient/family informed of [facility]'s ownership interest in [facility] and [facility], as well as of the fact that they are under no obligation to receive care at these facilities.  PASRR submitted to EDS on 04/24/15     PASRR number received on 04/24/15     Existing PASRR number confirmed on       FL2 transmitted to all facilities in geographic area requested by pt/family on 04/24/15     FL2 transmitted to all facilities within larger geographic area on       Patient informed that his/her managed care company has contracts with or will negotiate with certain facilities, including the following:            Patient/family informed of bed offers received.  Patient chooses bed at       Physician recommends and patient chooses bed at      Patient to be transferred to   on  .  Patient to be transferred to facility by       Patient family notified on   of transfer.  Name of family member notified:        PHYSICIAN Please sign FL2     Additional CommentBerton Mount, [place]

## 2015-04-25 ENCOUNTER — Observation Stay (HOSPITAL_COMMUNITY): Payer: Medicare Other

## 2015-04-25 DIAGNOSIS — I480 Paroxysmal atrial fibrillation: Secondary | ICD-10-CM | POA: Diagnosis not present

## 2015-04-25 DIAGNOSIS — S3991XA Unspecified injury of abdomen, initial encounter: Secondary | ICD-10-CM | POA: Diagnosis not present

## 2015-04-25 DIAGNOSIS — R05 Cough: Secondary | ICD-10-CM | POA: Diagnosis not present

## 2015-04-25 DIAGNOSIS — D709 Neutropenia, unspecified: Secondary | ICD-10-CM

## 2015-04-25 DIAGNOSIS — J449 Chronic obstructive pulmonary disease, unspecified: Secondary | ICD-10-CM | POA: Diagnosis not present

## 2015-04-25 DIAGNOSIS — E039 Hypothyroidism, unspecified: Secondary | ICD-10-CM | POA: Diagnosis not present

## 2015-04-25 DIAGNOSIS — I959 Hypotension, unspecified: Secondary | ICD-10-CM | POA: Diagnosis not present

## 2015-04-25 DIAGNOSIS — S301XXA Contusion of abdominal wall, initial encounter: Secondary | ICD-10-CM | POA: Diagnosis not present

## 2015-04-25 DIAGNOSIS — D61818 Other pancytopenia: Secondary | ICD-10-CM | POA: Diagnosis not present

## 2015-04-25 DIAGNOSIS — W19XXXA Unspecified fall, initial encounter: Secondary | ICD-10-CM | POA: Diagnosis not present

## 2015-04-25 DIAGNOSIS — D696 Thrombocytopenia, unspecified: Secondary | ICD-10-CM | POA: Diagnosis not present

## 2015-04-25 DIAGNOSIS — Z515 Encounter for palliative care: Secondary | ICD-10-CM | POA: Diagnosis not present

## 2015-04-25 DIAGNOSIS — J90 Pleural effusion, not elsewhere classified: Secondary | ICD-10-CM | POA: Diagnosis not present

## 2015-04-25 LAB — BASIC METABOLIC PANEL
ANION GAP: 5 (ref 5–15)
BUN: 14 mg/dL (ref 6–20)
CO2: 23 mmol/L (ref 22–32)
Calcium: 7.6 mg/dL — ABNORMAL LOW (ref 8.9–10.3)
Chloride: 101 mmol/L (ref 101–111)
Creatinine, Ser: 0.96 mg/dL (ref 0.61–1.24)
GFR calc Af Amer: >60 mL/min (ref >60)
GLUCOSE: 118 mg/dL — AB (ref 65–99)
POTASSIUM: 4.1 mmol/L (ref 3.5–5.1)
Sodium: 129 mmol/L — ABNORMAL LOW (ref 135–145)

## 2015-04-25 LAB — CBC
HCT: 29.5 % — ABNORMAL LOW (ref 39.0–52.0)
HEMOGLOBIN: 9.7 g/dL — AB (ref 13.0–17.0)
MCH: 30.3 pg (ref 26.0–34.0)
MCHC: 32.9 g/dL (ref 30.0–36.0)
MCV: 92.2 fL (ref 78.0–100.0)
Platelets: 93 10*3/uL — ABNORMAL LOW (ref 150–400)
RBC: 3.2 MIL/uL — AB (ref 4.22–5.81)
RDW: 17.2 % — ABNORMAL HIGH (ref 11.5–15.5)
WBC: 1.6 10*3/uL — ABNORMAL LOW (ref 4.0–10.5)

## 2015-04-25 LAB — TYPE AND SCREEN
ABO/RH(D): O POS
Antibody Screen: NEGATIVE
Unit division: 0

## 2015-04-25 LAB — MAGNESIUM: Magnesium: 2 mg/dL (ref 1.7–2.4)

## 2015-04-25 MED ORDER — FUROSEMIDE 10 MG/ML IJ SOLN
40.0000 mg | Freq: Once | INTRAMUSCULAR | Status: AC
  Administered 2015-04-25: 40 mg via INTRAVENOUS
  Filled 2015-04-25: qty 4

## 2015-04-25 NOTE — Progress Notes (Signed)
Pt O2 saturation at 95% on room air. Pt however, was breathing fast at 24-26 per minute. Pt indicated he is slightly short of breath.

## 2015-04-25 NOTE — Progress Notes (Signed)
PROGRESS NOTE  Kevin Rubio CBU:[id number] DOB: 1922-04-03 DOA: 04/21/2015 PCP: Nyoka Cowden, MD  HPI/Recap of past 24 hours:  Was in afib last night, converted to sinus rhythm this am, on 2liter oxygen, noticed dry cough during encounter, wife in room   Assessment/Plan: Active Problems:   Hyperlipidemia   Atrial fibrillation   Hypothyroidism   Thrombocytopenia   Chronic anemia   Hemorrhage   Psoas hematoma, left, secondary to anticoagulant therapy   Pancytopenia  Left psoas and iliopsoas hematoma/Left hip pain -Seen on CT abdomen and pelvis -Trauma Surgery consulted and appreciated, -INR reversed (fresh frozen plasma and vitamin K ordered in the ED)  Supratherapeutic INR -Likely complicated by recent abx use, off coumadin -INR being reversed with fresh frozen plasma and vitamin K -inr 1.7 on 9/1   Atrial fibrillation/RVR/paf -initially in sinus rhythm, rate controlled, developed afib/rvr 8/31, heart rate in 140's to 150's, transferred to stepdown, was on cardizem drip -now off cardizem drip, converted back to sinus rhythm on 9/1 , continue metoprolol, cardiology input appreciated -off Coumadin,Coumadin should be indefinitely held due to fall and bleeding risk -back to afib /rvr on 9/2, cardiology started amiodarone, and increase lopressor dose 9/3am ,back to nsr.  Volume overload: ivf d/ced, lasix 51m iv on 9/2, iv lasix 427m9/3  Cough: from volume overload? Ct chest pending.  Splenomegaly with pancytopenia -Concerning for lymphoma -Per wife, patient has had several procedures including bone marrow biopsy in the past and follows up with Dr. ShAlen Blewvery 6 months at the cancer center. (added Dr. ShAlen Blewo treatment team) -monitor CBC   Recent urinary tract infection -Supposedly diagnosed on 04/20/2015 by PCP -UA today 04/21/2015 negative for infection -Patient was recently admitted and discharged 03/26/2015 with MSSA UTI as well as orchitis, patient  was to follow-up with urology. At that time, patient was placed on Levaquin for 10 day course -due to neutropenia, patient is at risk of having recurrent infection  Falls/FTT - PT and OT for evaluation and treatment/ SNF -family reported patient has been very independent until a months ago, he became progressively weak and fell several times after  after his urinary tract infection, patient's wife is hopeful that patient can recover and back to his normal self a month ago, discussed with patient's wife and daughter in law, patient has showed signed of continued decline, it is less likely that patient to make a full recover, discussed palliative team consult , they agree. Palliative team input appreciated  Hypothyroidism -Continue Synthroid -TSH 7.97 on 04/20/2015  -free T4 level wnl  Hyperlipidemia -Continue statin  DVT prophylaxis: SCDs  Code Status: DNR  Condition: Guarded   Disposition Plan:  SNF likely 9/4   Consultants:  Cardiology  Trauma  Palliative care  Procedures:  prbc transfusion x1, held due to volume overload  ffp transfusion  Antibiotics: none   Objective: BP 100/53 mmHg  Pulse 69  Temp(Src) 97.7 F (36.5 C) (Oral)  Resp 24  Ht 5' 8"  (1.727 m)  Wt 156 lb 11.2 oz (71.079 kg)  BMI 23.83 kg/m2  SpO2 97%  Intake/Output Summary (Last 24 hours) at 04/25/15 1648 Last data filed at 04/25/15 0507  Gross per 24 hour  Intake    120 ml  Output   2200 ml  Net  -2080 ml   Filed Weights   04/22/15 0956 04/24/15 0430 04/25/15 0507  Weight: 156 lb 4.9 oz (70.9 kg) 162 lb 4.1 oz (73.6 kg) 156 lb 11.2 oz (71.079 kg)  Exam:   General:  Frail, NAD  Cardiovascular: RRR  Respiratory: CTABL  Abdomen: Soft/ND/NT, positive BS  Musculoskeletal: 2+ pitting Edema lower extremity  Neuro: alert and cooperative  Data Reviewed: Basic Metabolic Panel:  Recent Labs Lab 04/21/15 1140 04/22/15 0600 04/23/15 0257 04/24/15 0525 04/25/15 0257  NA  131* 133* 128* 130* 129*  K 4.1 3.3* 4.2 3.8 4.1  CL 105 106 103 104 101  CO2 21* 20* 20* 22 23  GLUCOSE 109* 81 123* 101* 118*  BUN 17 11 15 14 14   CREATININE 1.01 0.77 0.89 0.87 0.96  CALCIUM 7.8* 7.4* 7.5* 7.6* 7.6*  MG  --  1.7  --  1.7 2.0   Liver Function Tests:  Recent Labs Lab 04/20/15 1524 04/21/15 1140  AST 23 31  ALT 19 18  ALKPHOS 110 99  BILITOT 0.8 0.7  PROT 7.5 6.3*  ALBUMIN 2.4* 1.8*   No results for input(s): LIPASE, AMYLASE in the last 168 hours. No results for input(s): AMMONIA in the last 168 hours. CBC:  Recent Labs Lab 04/20/15 1524 04/21/15 1140  04/22/15 0600 04/22/15 0917 04/22/15 1615 04/23/15 0257 04/24/15 0525 04/25/15 0257  WBC 2.4 Repeated and verified X2.* 1.5*  --  1.2*  --   --  1.4* 1.4* 1.6*  NEUTROABS 1.9 1.1*  --   --   --   --   --   --   --   HGB 10.7* 10.0*  < > 8.4* 10.0* 9.2* 8.8* 8.6* 9.7*  HCT 32.6* 30.0*  < > 27.0* 31.0* 28.8* 27.9* 26.4* 29.5*  MCV 94.4 94.3  --  94.7  --   --  93.0 92.0 92.2  PLT 113.0 Repeated and verified X2.* 90*  --  85*  --   --  92* 94* 93*  < > = values in this interval not displayed. Cardiac Enzymes:   No results for input(s): CKTOTAL, CKMB, CKMBINDEX, TROPONINI in the last 168 hours. BNP (last 3 results)  Recent Labs  09/17/14 1900 03/21/15 1635  BNP 302.1* 272.4*    ProBNP (last 3 results) No results for input(s): PROBNP in the last 8760 hours.  CBG: No results for input(s): GLUCAP in the last 168 hours.  Recent Results (from the past 240 hour(s))  Culture, blood (routine x 2)     Status: None (Preliminary result)   Collection Time: 04/21/15 11:40 AM  Result Value Ref Range Status   Specimen Description BLOOD LEFT FOREARM  Final   Special Requests BOTTLES DRAWN AEROBIC AND ANAEROBIC 5CC  Final   Culture NO GROWTH 4 DAYS  Final   Report Status PENDING  Incomplete  Culture, blood (routine x 2)     Status: None (Preliminary result)   Collection Time: 04/21/15 11:45 AM  Result  Value Ref Range Status   Specimen Description BLOOD RIGHT FOREARM  Final   Special Requests BOTTLES DRAWN AEROBIC AND ANAEROBIC 5CC  Final   Culture NO GROWTH 4 DAYS  Final   Report Status PENDING  Incomplete  Urine culture     Status: None   Collection Time: 04/21/15 12:20 PM  Result Value Ref Range Status   Specimen Description URINE, RANDOM  Final   Special Requests NONE  Final   Culture NO GROWTH 1 DAY  Final   Report Status 04/22/2015 FINAL  Final  MRSA PCR Screening     Status: None   Collection Time: 04/22/15 10:00 AM  Result Value Ref Range Status   MRSA by  PCR NEGATIVE NEGATIVE Final    Comment:        The GeneXpert MRSA Assay (FDA approved for NASAL specimens only), is one component of a comprehensive MRSA colonization surveillance program. It is not intended to diagnose MRSA infection nor to guide or monitor treatment for MRSA infections.      Studies: No results found.  Scheduled Meds: . sodium chloride  10 mL/hr Intravenous Once  . amiodarone  400 mg Oral BID  . antiseptic oral rinse  7 mL Mouth Rinse BID  . cyanocobalamin  1,000 mcg Intramuscular Q30 days  . cycloSPORINE  1 drop Both Eyes BID  . furosemide  40 mg Intravenous Once  . ipratropium  0.5 mg Nebulization TID  . levalbuterol  0.63 mg Nebulization TID  . levothyroxine  25 mcg Oral QAC breakfast  . metoprolol tartrate  50 mg Oral BID  . multivitamin with minerals  1 tablet Oral Daily  . omega-3 acid ethyl esters  2 capsule Oral Daily  . pravastatin  40 mg Oral q1800  . sodium chloride  3 mL Intravenous Q12H  . sodium chloride  3 mL Intravenous Q12H  . tamsulosin  0.4 mg Oral Daily  . vitamin C  500 mg Oral Daily    Continuous Infusions:     Time spent: 39mns  Nijel Flink MD, PhD  [facility] Pager [phone] If 7PM-7AM, please contact night-coverage at [email or web address], password [facility]9/10/2014, 4:48 PM

## 2015-04-25 NOTE — Progress Notes (Addendum)
Daily Progress Note   Patient Name: Kevin Rubio       Date: 04/25/2015 DOB: 1922-07-20  Age: 81 y.o. MRN#: [id number] Attending Physician: Florencia Reasons, MD Primary Care Physician: Nyoka Cowden, MD Admit Date: 04/21/2015  Reason for Consultation/Follow-up: Establishing goals of care  Subjective: I met with the patient and his wife. He reports he is doing well today. He states he is looking forward to being discharged to facility for rehabilitation tomorrow.  Kevin Rubio, his wife, and I discussed that the hospital can be useful as long as he is getting well enough from care he receives at the hospital to enjoy his time at home, but there is going to come a time in the near future where, if his goal is to be at home, he may be better served to plan on being at home and bringing care to him at home rather than continued trips to the hospital.   He seems in agreement that a good plan would be to go for rehab as they have previously been arranging. He has done well with rehabbing in the past. If he does and continues to thrive, I encouraged they continue with this plan. If, however, he is unable to regain function and he continues to decline, I recommended that she speak with his PCP to determine if he may be better served by focusing his care on staying at home with support of organization such as hospice.  Expressed understanding of this and stated they thought it would be a good plan to move forward. I expressed thanks for my meeting with them.  Length of Stay: none  Current Medications: Scheduled Meds:  . sodium chloride  10 mL/hr Intravenous Once  . amiodarone  400 mg Oral BID  . antiseptic oral rinse  7 mL Mouth Rinse BID  . cyanocobalamin  1,000 mcg Intramuscular Q30 days  . cycloSPORINE  1 drop Both Eyes BID  . furosemide  40 mg Intravenous Once  . ipratropium  0.5 mg Nebulization TID  . levalbuterol  0.63 mg Nebulization TID  . levothyroxine  25 mcg Oral QAC breakfast  .  metoprolol tartrate  50 mg Oral BID  . multivitamin with minerals  1 tablet Oral Daily  . omega-3 acid ethyl esters  2 capsule Oral Daily  . pravastatin  40 mg Oral q1800  . sodium chloride  3 mL Intravenous Q12H  . sodium chloride  3 mL Intravenous Q12H  . tamsulosin  0.4 mg Oral Daily  . vitamin C  500 mg Oral Daily    Continuous Infusions:    PRN Meds: sodium chloride, acetaminophen **OR** acetaminophen, morphine injection, RESOURCE THICKENUP CLEAR, sodium chloride  Vital Signs: BP 100/53 mmHg  Pulse 69  Temp(Src) 97.7 F (36.5 C) (Oral)  Resp 24  Ht 5' 8"  (1.727 m)  Wt 71.079 kg (156 lb 11.2 oz)  BMI 23.83 kg/m2  SpO2 97% SpO2: SpO2: 97 % O2 Device: O2 Device: Nasal Cannula O2 Flow Rate: O2 Flow Rate (L/min): 2 L/min  Intake/output summary:  Intake/Output Summary (Last 24 hours) at 04/25/15 1857 Last data filed at 04/25/15 1700  Gross per 24 hour  Intake    120 ml  Output   3100 ml  Net  -2980 ml   LBM:   Baseline Weight: Weight: 65.772 kg (145 lb) Most recent weight: Weight: 71.079 kg (156 lb 11.2 oz)  Physical Exam:  General: Frail, NAD  Cardiovascular: RRR  Respiratory: CTABL  Abdomen: Soft/ND/NT, positive  BS  Musculoskeletal: 2+ pitting Edema lower extremity Neuro: alert and cooperative         Additional Data Reviewed: Recent Labs     04/24/15  0525  04/25/15  0257  WBC  1.4*  1.6*  HGB  8.6*  9.7*  PLT  94*  93*  NA  130*  129*  BUN  14  14  CREATININE  0.87  0.96     Problem List:  Patient Active Problem List   Diagnosis Date Noted  . Hemorrhage 04/21/2015  . Psoas hematoma, left, secondary to anticoagulant therapy 04/21/2015  . Pancytopenia 04/21/2015  . Fall   . Atrial fibrillation with rapid ventricular response   . Sepsis due to urinary tract infection   . Epididymitis   . Orchitis   . Arterial hypotension   . Acute kidney injury   . Chronic atrial fibrillation   . CAD in native artery   . Thrombocytopenia   .  Leukopenia   . Chronic anemia   . Severe sepsis 03/21/2015  . Sepsis due to pneumonia 09/18/2014  . Blood poisoning   . Sepsis 09/17/2014  . Hypothyroidism 07/11/2014  . Impaired glucose tolerance 06/05/2014  . 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 Long-term (current) use of anticoagulants   . DYSPLASTIC NEVUS 01/24/2008  . Vitamin B 12 deficiency   . Atrial fibrillation   . Neutropenia 05/11/2007  . Hyperlipidemia   . Essential hypertension   . 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 CAD (coronary artery disease)   . BPH   . Rheumatoid arthritis      Palliative Care Assessment & Plan    Code Status:  DNR  Goals of Care:  Patient and his wife are invested in plan to go to SNF for rehabilitation. They will continue follow-up with their primary care provider regarding long-term plans moving forward.  Psycho-social/Spiritual:  Desire for further Chaplaincy support:no   Prognosis: Unable to determine Discharge Planning: [facility] for rehab with Palliative care service follow-up   Care plan was discussed with patient, his wife, and Dr. Erlinda Hong.  Thank you for allowing the Palliative Medicine Team to assist in the care of this patient.   Time In: 1600 Time Out: 1630 Total Time 30 Prolonged Time Billed  no     Greater than 50%  of this time was spent counseling and coordinating care related to the above assessment and plan.   Micheline Rough, MD  04/25/2015, 6:57 PM  Please contact Palliative Medicine Team phone at [phone] for questions and concerns.

## 2015-04-25 NOTE — Progress Notes (Signed)
Subjective: Breathing is OK  No CP   Objective: Filed Vitals:   04/24/15 1914 04/24/15 1957 04/24/15 2030 04/25/15 0507  BP:      Pulse:      Temp: 99.4 F (37.4 C)  99.3 F (37.4 C) 97.7 F (36.5 C)  TempSrc: Oral  Oral Oral  Resp:      Height:      Weight:    156 lb 11.2 oz (71.079 kg)  SpO2:  96%     Weight change: -5 lb 8.9 oz (-2.521 kg)  Intake/Output Summary (Last 24 hours) at 04/25/15 0730 Last data filed at 04/25/15 0507  Gross per 24 hour  Intake    440 ml  Output   2200 ml  Net  -1760 ml   Tele:  SR now    General: Alert, awake, oriented x3, in no acute distress Neck:  JVP is normal Heart: Regular rate and rhythm, without murmurs, rubs, gallops.  Lungs: Clear to auscultation.  No rales or wheezes. Exemities:  No edema.   Neuro: Grossly intact, nonfocal.   Lab Results: Results for orders placed or performed during the hospital encounter of 04/21/15 (from the past 24 hour(s))  Basic metabolic panel     Status: Abnormal   Collection Time: 04/25/15  2:57 AM  Result Value Ref Range   Sodium 129 (L) 135 - 145 mmol/L   Potassium 4.1 3.5 - 5.1 mmol/L   Chloride 101 101 - 111 mmol/L   CO2 23 22 - 32 mmol/L   Glucose, Bld 118 (H) 65 - 99 mg/dL   BUN 14 6 - 20 mg/dL   Creatinine, Ser 0.96 0.61 - 1.24 mg/dL   Calcium 7.6 (L) 8.9 - 10.3 mg/dL   GFR calc non Af Amer >60 >60 mL/min   GFR calc Af Amer >60 >60 mL/min   Anion gap 5 5 - 15  Magnesium     Status: None   Collection Time: 04/25/15  2:57 AM  Result Value Ref Range   Magnesium 2.0 1.7 - 2.4 mg/dL  CBC     Status: Abnormal   Collection Time: 04/25/15  2:57 AM  Result Value Ref Range   WBC 1.6 (L) 4.0 - 10.5 K/uL   RBC 3.20 (L) 4.22 - 5.81 MIL/uL   Hemoglobin 9.7 (L) 13.0 - 17.0 g/dL   HCT 29.5 (L) 39.0 - 52.0 %   MCV 92.2 78.0 - 100.0 fL   MCH 30.3 26.0 - 34.0 pg   MCHC 32.9 30.0 - 36.0 g/dL   RDW 17.2 (H) 11.5 - 15.5 %   Platelets 93 (L) 150 - 400 K/uL    Studies/Results: No results  found.  Medications: Reviewed   @PROBHOSP @ 1.  Atrial fib  Note that amiodarone started yesterday 400 bid and metoprolol increased.    Not felt to be coumadin candidate due to fall risk He converted to SR  HR OK I would keep on amio 400 bid here in hosp  Switch to 400 qd at discharge   He has not had a cardiologist until now.  Will need to make sure he has f/u appt after d/c for f/u of rhythm  2.  Hx of CAD  No symtpoms of angina  LVEF is normal    3.  HTN  Continue to follow    4.  Thyroid  TSH mildly elevated but free t4 normal  WIll need tofollow closely now that on amiodarone.    Dorris Carnes 04/25/2015, 7:30  AM

## 2015-04-25 NOTE — Progress Notes (Signed)
CSW spoke with patient's wife earlier today to request decision re: SNF choice. She stated she chose [facility] (who made an offer). After multiple attempts CSW was able to reach Admissions at [facility].  They cannot offer a bed for patient and she do not accept his insurance.  Discussed with wife and re-discussed current bed offers. She is still unsure which facility to pick but explained that patient will most likely will be stable for d/c tomorrow per MD.  She will attempt to speak to her daughter and CSW will follow up in about 15 minutes.    CSW spoke with Dr. Erlinda Hong earlier today about d/c. She stated that patient is not medically stable today as she is having A-Fib issues and is hopeful that she can d/c her tomorrow.  Patient is observation status and this was discussed with Dr. Rhunette Croft. She stated that she wants palliative to see patient as well.  Palliative care assessments can also be provided once patient is placed in a SNF as well.    Notified Alesia, weekend RNCM of above.  Lorie Phenix. Pauline Good, [place]

## 2015-04-25 NOTE — Progress Notes (Signed)
CSW met with patient and wife this afternoon. They have chosen [facility] as facility of choice.  This facility has offered on patient- however- CSW has been unable to reach anyone in admissions thus far. Message left on V-mail for return call. Per Dr. Erlinda Hong anticipates stability for d/c tomorrow.  Patient and wife have met with Dr. Domingo Cocking- Palliative Care this afternoon. Anticipate d/c to SNF tomorrow with Palliative care follow up.  Lorie Phenix. Pauline Good, [place]

## 2015-04-26 DIAGNOSIS — I2510 Atherosclerotic heart disease of native coronary artery without angina pectoris: Secondary | ICD-10-CM | POA: Diagnosis not present

## 2015-04-26 DIAGNOSIS — R627 Adult failure to thrive: Secondary | ICD-10-CM | POA: Diagnosis not present

## 2015-04-26 DIAGNOSIS — M25552 Pain in left hip: Secondary | ICD-10-CM | POA: Diagnosis not present

## 2015-04-26 DIAGNOSIS — K469 Unspecified abdominal hernia without obstruction or gangrene: Secondary | ICD-10-CM | POA: Diagnosis not present

## 2015-04-26 DIAGNOSIS — M545 Low back pain: Secondary | ICD-10-CM | POA: Diagnosis not present

## 2015-04-26 DIAGNOSIS — S301XXA Contusion of abdominal wall, initial encounter: Secondary | ICD-10-CM | POA: Diagnosis not present

## 2015-04-26 DIAGNOSIS — I959 Hypotension, unspecified: Secondary | ICD-10-CM | POA: Diagnosis not present

## 2015-04-26 DIAGNOSIS — E785 Hyperlipidemia, unspecified: Secondary | ICD-10-CM | POA: Diagnosis not present

## 2015-04-26 DIAGNOSIS — S3991XA Unspecified injury of abdomen, initial encounter: Secondary | ICD-10-CM | POA: Diagnosis not present

## 2015-04-26 DIAGNOSIS — M069 Rheumatoid arthritis, unspecified: Secondary | ICD-10-CM | POA: Diagnosis not present

## 2015-04-26 DIAGNOSIS — Z7901 Long term (current) use of anticoagulants: Secondary | ICD-10-CM | POA: Diagnosis not present

## 2015-04-26 DIAGNOSIS — M6281 Muscle weakness (generalized): Secondary | ICD-10-CM | POA: Diagnosis not present

## 2015-04-26 DIAGNOSIS — W19XXXA Unspecified fall, initial encounter: Secondary | ICD-10-CM | POA: Diagnosis not present

## 2015-04-26 DIAGNOSIS — I482 Chronic atrial fibrillation: Secondary | ICD-10-CM | POA: Diagnosis not present

## 2015-04-26 DIAGNOSIS — J90 Pleural effusion, not elsewhere classified: Secondary | ICD-10-CM

## 2015-04-26 DIAGNOSIS — Z87891 Personal history of nicotine dependence: Secondary | ICD-10-CM | POA: Diagnosis not present

## 2015-04-26 DIAGNOSIS — D649 Anemia, unspecified: Secondary | ICD-10-CM | POA: Diagnosis not present

## 2015-04-26 DIAGNOSIS — N400 Enlarged prostate without lower urinary tract symptoms: Secondary | ICD-10-CM | POA: Diagnosis not present

## 2015-04-26 DIAGNOSIS — R52 Pain, unspecified: Secondary | ICD-10-CM | POA: Diagnosis not present

## 2015-04-26 DIAGNOSIS — E538 Deficiency of other specified B group vitamins: Secondary | ICD-10-CM | POA: Diagnosis not present

## 2015-04-26 DIAGNOSIS — Z79899 Other long term (current) drug therapy: Secondary | ICD-10-CM | POA: Diagnosis not present

## 2015-04-26 DIAGNOSIS — I480 Paroxysmal atrial fibrillation: Secondary | ICD-10-CM | POA: Diagnosis not present

## 2015-04-26 DIAGNOSIS — K219 Gastro-esophageal reflux disease without esophagitis: Secondary | ICD-10-CM | POA: Diagnosis not present

## 2015-04-26 DIAGNOSIS — E039 Hypothyroidism, unspecified: Secondary | ICD-10-CM | POA: Diagnosis not present

## 2015-04-26 DIAGNOSIS — I5032 Chronic diastolic (congestive) heart failure: Secondary | ICD-10-CM | POA: Diagnosis not present

## 2015-04-26 DIAGNOSIS — E46 Unspecified protein-calorie malnutrition: Secondary | ICD-10-CM | POA: Diagnosis not present

## 2015-04-26 DIAGNOSIS — M79606 Pain in leg, unspecified: Secondary | ICD-10-CM | POA: Diagnosis not present

## 2015-04-26 DIAGNOSIS — D61818 Other pancytopenia: Secondary | ICD-10-CM | POA: Diagnosis not present

## 2015-04-26 DIAGNOSIS — Z9181 History of falling: Secondary | ICD-10-CM | POA: Diagnosis not present

## 2015-04-26 DIAGNOSIS — Z8701 Personal history of pneumonia (recurrent): Secondary | ICD-10-CM | POA: Diagnosis not present

## 2015-04-26 DIAGNOSIS — S39093D Other injury of muscle, fascia and tendon of pelvis, subsequent encounter: Secondary | ICD-10-CM | POA: Diagnosis not present

## 2015-04-26 DIAGNOSIS — J918 Pleural effusion in other conditions classified elsewhere: Secondary | ICD-10-CM | POA: Diagnosis not present

## 2015-04-26 DIAGNOSIS — D709 Neutropenia, unspecified: Secondary | ICD-10-CM | POA: Diagnosis not present

## 2015-04-26 DIAGNOSIS — I10 Essential (primary) hypertension: Secondary | ICD-10-CM | POA: Diagnosis not present

## 2015-04-26 DIAGNOSIS — I252 Old myocardial infarction: Secondary | ICD-10-CM | POA: Diagnosis not present

## 2015-04-26 DIAGNOSIS — I4891 Unspecified atrial fibrillation: Secondary | ICD-10-CM | POA: Diagnosis not present

## 2015-04-26 DIAGNOSIS — D696 Thrombocytopenia, unspecified: Secondary | ICD-10-CM | POA: Diagnosis not present

## 2015-04-26 DIAGNOSIS — M79662 Pain in left lower leg: Secondary | ICD-10-CM | POA: Diagnosis not present

## 2015-04-26 DIAGNOSIS — R278 Other lack of coordination: Secondary | ICD-10-CM | POA: Diagnosis not present

## 2015-04-26 LAB — CULTURE, BLOOD (ROUTINE X 2)
Culture: NO GROWTH
Culture: NO GROWTH

## 2015-04-26 LAB — BASIC METABOLIC PANEL
Anion gap: 6 (ref 5–15)
BUN: 19 mg/dL (ref 6–20)
CHLORIDE: 99 mmol/L — AB (ref 101–111)
CO2: 24 mmol/L (ref 22–32)
CREATININE: 0.93 mg/dL (ref 0.61–1.24)
Calcium: 7.4 mg/dL — ABNORMAL LOW (ref 8.9–10.3)
GFR calc non Af Amer: >60 mL/min (ref >60)
GLUCOSE: 99 mg/dL (ref 65–99)
Potassium: 3.9 mmol/L (ref 3.5–5.1)
Sodium: 129 mmol/L — ABNORMAL LOW (ref 135–145)

## 2015-04-26 LAB — CBC
HEMATOCRIT: 29.8 % — AB (ref 39.0–52.0)
HEMOGLOBIN: 9.3 g/dL — AB (ref 13.0–17.0)
MCH: 29 pg (ref 26.0–34.0)
MCHC: 31.2 g/dL (ref 30.0–36.0)
MCV: 92.8 fL (ref 78.0–100.0)
Platelets: 88 10*3/uL — ABNORMAL LOW (ref 150–400)
RBC: 3.21 MIL/uL — ABNORMAL LOW (ref 4.22–5.81)
RDW: 17.2 % — ABNORMAL HIGH (ref 11.5–15.5)
WBC: 1.8 10*3/uL — ABNORMAL LOW (ref 4.0–10.5)

## 2015-04-26 LAB — MAGNESIUM: Magnesium: 1.8 mg/dL (ref 1.7–2.4)

## 2015-04-26 MED ORDER — FUROSEMIDE 10 MG/ML IJ SOLN
40.0000 mg | Freq: Two times a day (BID) | INTRAMUSCULAR | Status: DC
  Administered 2015-04-26: 40 mg via INTRAVENOUS
  Filled 2015-04-26: qty 4

## 2015-04-26 MED ORDER — RESOURCE THICKENUP CLEAR PO POWD: ORAL | Status: AC

## 2015-04-26 MED ORDER — METOPROLOL TARTRATE 50 MG PO TABS: 50.0000 mg | ORAL_TABLET | Freq: Two times a day (BID) | ORAL | Status: AC

## 2015-04-26 MED ORDER — FUROSEMIDE 40 MG PO TABS: 40.0000 mg | ORAL_TABLET | Freq: Every day | ORAL | Status: AC

## 2015-04-26 MED ORDER — AMIODARONE HCL 400 MG PO TABS: 400.0000 mg | ORAL_TABLET | Freq: Every day | ORAL | Status: AC

## 2015-04-26 NOTE — Progress Notes (Signed)
CSW (Clinical Education officer, museum) prepared pt dc packet and placed with shadow chart. CSW arranged non-emergent ambulance transport. Pt, pt family, pt nurse, and facility informed. CSW signing off.   Adair Laundry Weekend CSW [phone]

## 2015-04-26 NOTE — Discharge Summary (Signed)
Discharge Summary  Kevin Rubio UQJ:[id number] DOB: 06/19/1922  PCP: Nyoka Cowden, MD  Admit date: 04/21/2015 Discharge date: 04/26/2015  Time spent: >19mns  Recommendations for Outpatient Follow-up:  1. F/u with PMD within a week for hospital discharge follow up, repeat cbc/bmp at follow up, titrate lasix dose. 2. F/u with cardiology Dr. TWynonia Lawmanin three weeks for afib/diastolic chf/bilateral pleural effusion  Discharge Diagnoses:  Active Hospital Problems   Diagnosis Date Noted  . Palliative care encounter   . Hemorrhage 04/21/2015  . Psoas hematoma, left, secondary to anticoagulant therapy 04/21/2015  . Pancytopenia 04/21/2015  . Chronic anemia   . Thrombocytopenia   . Hypothyroidism 07/11/2014  . Atrial fibrillation   . Hyperlipidemia     Resolved Hospital Problems   Diagnosis Date Noted Date Resolved  No resolved problems to display.    Discharge Condition: stable  Diet recommendation: heart healthy  Filed Weights   04/24/15 0430 04/25/15 0507 04/26/15 0400  Weight: 162 lb 4.1 oz (73.6 kg) 156 lb 11.2 oz (71.079 kg) 151 lb (68.493 kg)    History of present illness:  Kevin GOLDWIREis a 81y.o. male  With a history of atrial fibrillation on Coumadin, recent admission for urinary tract infection, anemia, hypertension hyperlipidemia, that presented to the emergency department after falling. Patient fell today and has been frequently over the last several days. He states his "legs have been giving out". Patient does use a walker for ambulation. Patient was recently seen by his primary care physician on 04/20/2015 and was being treated for urinary tract infection. He was recently taken off of his Coumadin by his primary care physician due to antibiotics. Today, patient fell and began implanting of left hip pain. EMS was called and patient was brought to the hospital. Upon arrival to the emergency department, patient was noted to have an INR 7.88. EDP ordered  fresh frozen plasma as well as vitamin K. Pending administration. CT of the abdomen and pelvis obtained showing left so as hematoma.Trauma Surgery was called. TRH called for admission.   Hospital Course:  Active Problems:   Hyperlipidemia   Atrial fibrillation   Hypothyroidism   Thrombocytopenia   Chronic anemia   Hemorrhage   Psoas hematoma, left, secondary to anticoagulant therapy   Pancytopenia   Palliative care encounter  Left psoas and iliopsoas hematoma/Left hip pain -Seen on CT abdomen and pelvis -Trauma Surgery consulted and recommended conservative managment -INR reversed (fresh frozen plasma and vitamin K ordered in the ED)  Supratherapeutic INR -Likely complicated by recent abx use, off coumadin -INR being reversed with fresh frozen plasma and vitamin K   Atrial fibrillation/RVR/paf -initially in sinus rhythm, rate controlled, developed afib/rvr 8/31, heart rate in 140's to 150's, transferred to stepdown, was on cardizem drip -now off cardizem drip, converted back to sinus rhythm on 9/1 , continue metoprolol, cardiology input appreciated -off Coumadin,Coumadin should be indefinitely held due to fall and bleeding risk -back to afib /rvr on 9/2, cardiology started amiodarone, and increase lopressor dose 9/3am ,back to nsr.  9/4 remain in NSR, cleared by cardiology to discharge to snf with amiodarone 466mpo qd and increased dose of lopressor, patient is to follow up with cardiology as outpatient  Volume overload/diastolic chf/bilateral pleural effusion by ct chest: received iv lasix, discharged with oral lasix and oxygen supplement   Splenomegaly with pancytopenia -Per wife, patient has had several procedures including bone marrow biopsy in the past and follows up with Dr. ShAlen Blewvery 6 months  at the cancer center. (added Dr. 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 At that time, patient was placed on Levaquin for 10 day course -due to neutropenia, patient is at risk of having recurrent infection  Falls/FTT - PT and OT for evaluation and treatment/ SNF -family reported patient has been very independent until a months ago, he became progressively weak and fell several times after after his urinary tract infection, patient's wife is hopeful that patient can recover and back to his normal self a month ago, discussed with patient's wife and daughter in law, patient has showed signed of continued decline, it is less likely that patient to make a full recover, discussed palliative team consult , they agree. Palliative team input appreciated  Hypothyroidism -Continue Synthroid -TSH 7.97 on 04/20/2015  -free T4 level wnl  Hyperlipidemia -Continue statin  DVT prophylaxis: SCDs  Code Status: DNR/DNI  Condition: Guarded   Disposition Plan: SNF  9/4   Consultants:  Cardiology  Trauma  Palliative care  Procedures:  prbc transfusion x1, held due to volume overload  ffp transfusion  Antibiotics: none   Discharge Exam: BP 108/48 mmHg  Pulse 64  Temp(Src) 97.7 F (36.5 C) (Oral)  Resp 25  Ht $R'5\' 8"'PS$  (1.727 m)  Wt 151 lb (68.493 kg)  BMI 22.96 kg/m2  SpO2 96%   General: Frail, NAD  Cardiovascular: RRR  Respiratory: CTABL, diminished at basis  Abdomen: Soft/ND/NT, positive BS  Musculoskeletal: 2+ pitting Edema lower extremity subsiding, chronic unlar deviation of bilateral hands  Neuro: alert and cooperative   Discharge Instructions You were cared for by a hospitalist during your hospital stay. If you have any questions about your discharge medications or the care you received while  you were in the hospital after you are discharged, you can call the unit and asked to speak with the hospitalist on call if the hospitalist that took care of you is not available. Once you are discharged, your primary care physician will handle any further medical issues. Please note that NO REFILLS for any discharge medications will be authorized once you are discharged, as it is imperative that you return to your primary care physician (or establish a relationship with a primary care physician if you do not have one) for your aftercare needs so that they can reassess your need for medications and monitor your lab values.  Discharge Instructions    Diet - low sodium heart healthy    Complete by:  As directed      Increase activity slowly    Complete by:  As directed             Medication List    STOP taking these medications        acetaminophen 500 MG tablet  Commonly known as:  TYLENOL      TAKE these medications        amiodarone 400 MG tablet  Commonly known as:  PACERONE  Take 1 tablet (400 mg total) by mouth daily.     ascorbic Acid 500 MG Cpcr  Commonly known as:  VITAMIN C  Take 500 mg by mouth daily.     CENTRUM SILVER PO  Take 1 tablet by mouth  daily.     cyanocobalamin 1000 MCG/ML injection  Commonly known as:  (VITAMIN B-12)  Inject 1,000 mcg into the muscle every 30 (thirty) days.     cycloSPORINE 0.05 % ophthalmic emulsion  Commonly known as:  RESTASIS  Place 1 drop into both eyes 2 (two) times daily.     Fish Oil 1000 MG Caps  Take 2 capsules by mouth daily.     furosemide 40 MG tablet  Commonly known as:  LASIX  Take 1 tablet (40 mg total) by mouth daily.     IRON CR PO  Take 65 mg by mouth daily.     levothyroxine 25 MCG tablet  Commonly known as:  SYNTHROID, LEVOTHROID  TAKE ONE TABLET BY MOUTH ONCE DAILY BEFORE  BREAKFAST     metoprolol 50 MG tablet  Commonly known as:  LOPRESSOR  Take 1 tablet (50 mg total) by mouth 2 (two) times daily.      pravastatin 40 MG tablet  Commonly known as:  PRAVACHOL  TAKE ONE TABLET BY MOUTH ONCE DAILY     RESOURCE THICKENUP CLEAR Powd  Tid with meals     tamsulosin 0.4 MG Caps capsule  Commonly known as:  FLOMAX  Take 1 capsule (0.4 mg total) by mouth daily.       Allergies  Allergen Reactions  . Cefpodoxime Proxetil Other (See Comments)    unknown       Follow-up Information    Follow up with Nyoka Cowden, MD In 1 week.   Specialty:  Internal Medicine   Why:  hospital follow up , repeat cbc/bmp at follow up, titrate lasix dose   Contact information:   [place] [address] [phone]       Follow up with Ezzard Standing, MD In 3 weeks.   Specialty:  Cardiology   Why:  afib   Contact information:   [place]  [id number] [phone]        The results of significant diagnostics from this hospitalization (including imaging, microbiology, ancillary and laboratory) are listed below for reference.    Significant Diagnostic Studies: Dg Chest 2 View  04/21/2015   CLINICAL DATA:  Shortness of breath  EXAM: CHEST  2 VIEW  COMPARISON:  03/21/2015  FINDINGS: Chronic interstitial coarsening, accentuated by hypoventilation. Posterior costophrenic sulci are not visible, but no suspected effusion. There is no edema, consolidation,or pneumothorax. Normal heart size and aortic contours.  IMPRESSION: No evidence of acute cardiopulmonary disease.   Electronically Signed   By: Monte Fantasia M.D.   On: 04/21/2015 14:14   Ct Chest Wo Contrast  04/25/2015   CLINICAL DATA:  Severe cough.  History of Felty's syndrome.  EXAM: CT CHEST WITHOUT CONTRAST  TECHNIQUE: Multidetector CT imaging of the chest was performed following the standard protocol without IV contrast.  COMPARISON:  Portable chest dated 04/22/2015  FINDINGS: Moderate-sized bilateral pleural effusions with adjacent bilateral lower lobe atelectasis. Minimal dependent  atelectasis in both upper lobes. No lung nodules or enlarged lymph nodes. The lungs are mildly hyperexpanded with minimal diffuse bullous changes. Atheromatous coronary artery calcifications. Thoracic spine degenerative changes, including changes of DISH. Cholecystectomy clips. Enlarged spleen.  IMPRESSION: 1. Moderate-sized bilateral pleural effusions and associated bilateral lower lobe atelectasis. 2. Minimal bilateral upper lobe dependent atelectasis. 3. Mild changes of COPD. 4. Atheromatous coronary artery calcifications. 5. 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   Electronically Signed   By: Remo Lipps  Joneen Caraway M.D.   On: 04/25/2015 20:34   Ct Abdomen Pelvis W Contrast  04/21/2015   CLINICAL DATA:  Fall yesterday and today. Elevated INR. Left hip and abdominal pain.  EXAM: CT ABDOMEN AND PELVIS WITH CONTRAST  TECHNIQUE: Multidetector CT imaging of the abdomen and pelvis was performed using the standard protocol following bolus administration of intravenous contrast.  CONTRAST:  135mL OMNIPAQUE IOHEXOL 300 MG/ML  SOLN  COMPARISON:  03/21/2015  FINDINGS: There are small bilateral pleural effusions. Heart is normal size. Minimal dependent bibasilar atelectasis.  Prior cholecystectomy. Liver, pancreas, adrenals are unremarkable. Small cyst in the lower pole of the right kidney which appears benign.  There is splenomegaly. Spleen measures 18 cm in craniocaudal length.  There is a left psoas and ileus psoas fluid collection with rim enhancement. This measures 5.8 by 6.7 cm in axial imaging. This is approximately 14 cm in craniocaudal length on coronal imaging. This most likely scratch sec given the history, this most likely represents psoas hematoma. No active extravasation. This displaces the left kidney out of the left renal fossa.  Aorta and iliac vessels are calcified, non aneurysmal. There is sigmoid diverticulosis. No active diverticulitis. Stomach and small bowel are unremarkable. Urinary  bladder unremarkable. Small amount of free fluid adjacent to the spleen and in the cul-de-sac. No adenopathy. No free air.  Rightward scoliosis and degenerative changes in the lumbar spine. No acute bony abnormality or focal bone lesion.  IMPRESSION: Left psoas and ileo psoas fluid collection. Given history this is most compatible with hematoma.  Splenomegaly.  Small amount of free fluid in the abdomen and pelvis. Small bilateral pleural effusions.  These results were called by telephone at the time of interpretation on 04/21/2015 at 4:40 pm to Dr. Georganna Skeans, who verbally acknowledged these results.   Electronically Signed   By: Rolm Baptise M.D.   On: 04/21/2015 16:59   Dg Chest Port 1 View  04/22/2015   CLINICAL DATA:  81 year old male with wheezing. Initial encounter.  EXAM: PORTABLE CHEST - 1 VIEW  COMPARISON:  04/21/2015 and earlier.  FINDINGS: Portable AP semi upright view at 1434 hrs. New patchy and confluent bibasilar opacity slightly greater on the right. Stable cardiac size and mediastinal contours. No pneumothorax or pulmonary edema. No pleural effusion.  IMPRESSION: New patchy bibasilar opacity is nonspecific, consider aspiration/pneumonia in this setting. No pleural effusion identified.   Electronically Signed   By: Genevie Ann M.D.   On: 04/22/2015 14:39    Microbiology: Recent Results (from the past 240 hour(s))  Culture, blood (routine x 2)     Status: None (Preliminary result)   Collection Time: 04/21/15 11:40 AM  Result Value Ref Range Status   Specimen Description BLOOD LEFT FOREARM  Final   Special Requests BOTTLES DRAWN AEROBIC AND ANAEROBIC 5CC  Final   Culture NO GROWTH 4 DAYS  Final   Report Status PENDING  Incomplete  Culture, blood (routine x 2)     Status: None (Preliminary result)   Collection Time: 04/21/15 11:45 AM  Result Value Ref Range Status   Specimen Description BLOOD RIGHT FOREARM  Final   Special Requests BOTTLES DRAWN AEROBIC AND ANAEROBIC 5CC  Final    Culture NO GROWTH 4 DAYS  Final   Report Status PENDING  Incomplete  Urine culture     Status: None   Collection Time: 04/21/15 12:20 PM  Result Value Ref Range Status   Specimen Description URINE, RANDOM  Final   Special Requests NONE  Final   Culture NO GROWTH 1 DAY  Final   Report Status 04/22/2015 FINAL  Final  MRSA PCR Screening     Status: None   Collection Time: 04/22/15 10:00 AM  Result Value Ref Range Status   MRSA by PCR NEGATIVE NEGATIVE Final    Comment:        The GeneXpert MRSA Assay (FDA approved for NASAL specimens only), is one component of a comprehensive MRSA colonization surveillance program. It is not intended to diagnose MRSA infection nor to guide or monitor treatment for MRSA infections.      Labs: Basic Metabolic Panel:  Recent Labs Lab 04/22/15 0600 04/23/15 0257 04/24/15 0525 04/25/15 0257 04/26/15 0550  NA 133* 128* 130* 129* 129*  K 3.3* 4.2 3.8 4.1 3.9  CL 106 103 104 101 99*  CO2 20* 20* _0 GLUCOSE 81 123* 101* 118* 99  BUN _1 CREATININE 0.77 0.89 0.87 0.96 0.93  CALCIUM 7.4* 7.5* 7.6* 7.6* 7.4*  MG 1.7  --  1.7 2.0 1.8   Liver Function Tests:  Recent Labs Lab 04/20/15 1524 04/21/15 1140  AST 23 31  ALT 19 18  ALKPHOS 110 99  BILITOT 0.8 0.7  PROT 7.5 6.3*  ALBUMIN 2.4* 1.8*   No results for input(s): LIPASE, AMYLASE in the last 168 hours. No results for input(s): AMMONIA in the last 168 hours. CBC:  Recent Labs Lab 04/20/15 1524 04/21/15 1140  04/22/15 0600  04/22/15 1615 04/23/15 0257 04/24/15 0525 04/25/15 0257 04/26/15 0550  WBC 2.4 Repeated and verified X2.* 1.5*  --  1.2*  --   --  1.4* 1.4* 1.6* 1.8*  NEUTROABS 1.9 1.1*  --   --   --   --   --   --   --   --   HGB 10.7* 10.0*  < > 8.4*  < > 9.2* 8.8* 8.6* 9.7* 9.3*  HCT 32.6* 30.0*  < > 27.0*  < > 28.8* 27.9* 26.4* 29.5* 29.8*  MCV 94.4 94.3  --  94.7  --   --  93.0 92.0 92.2 92.8  PLT 113.0 Repeated and verified X2.* 90*  --  85*   --   --  92* 94* 93* 88*  < > = values in this interval not displayed. Cardiac Enzymes: No results for input(s): CKTOTAL, CKMB, CKMBINDEX, TROPONINI in the last 168 hours. BNP: BNP (last 3 results)  Recent Labs  09/17/14 1900 03/21/15 1635  BNP 302.1* 272.4*    ProBNP (last 3 results) No results for input(s): PROBNP in the last 8760 hours.  CBG: No results for input(s): GLUCAP in the last 168 hours.     SignedFlorencia Reasons MD, PhD  [facility] 04/26/2015, 10:55 AM

## 2015-04-26 NOTE — Care Management Note (Signed)
Case Management Note  Patient Details  Name: Kevin Rubio MRN: [id number] Date of Birth: [birth year]  Subjective/Objective:     Left psoas and iliopsoas hematoma/Left hip pain               Action/Plan: SNF  Expected Discharge Date:  04/25/15               Expected Discharge Plan:  [place]  In-House Referral:     Discharge planning Services  CM Consult   Status of Service:  Completed, signed off  Medicare Important Message Given:    Date Medicare IM Given:    Medicare IM give by:    Date Additional Medicare IM Given:    Additional Medicare Important Message give by:     If discussed at [place] of Stay Meetings, dates discussed:    Additional Comments:  Erenest Rasher, RN 04/26/2015, 8:15 PM

## 2015-04-26 NOTE — Clinical Social Work Placement (Signed)
   CLINICAL SOCIAL WORK PLACEMENT  NOTE  Date:  04/26/2015  Patient Details  Name: Kevin Rubio MRN: [id number] Date of Birth: [birth year]  Clinical Social Work is seeking post-discharge placement for this patient at the [place] level of care (*CSW will initial, date and re-position this form in  chart as items are completed):  Yes   Patient/family provided with [place] Work Department's list of facilities offering this level of care within the geographic area requested by the patient (or if unable, by the patient's family).  Yes   Patient/family informed of their freedom to choose among providers that offer the needed level of care, that participate in Medicare, Medicaid or managed care program needed by the patient, have an available bed and are willing to accept the patient.  Yes   Patient/family informed of [facility]'s ownership interest in [facility] and [facility], as well as of the fact that they are under no obligation to receive care at these facilities.  PASRR submitted to EDS on 04/24/15     PASRR number received on 04/24/15     Existing PASRR number confirmed on       FL2 transmitted to all facilities in geographic area requested by pt/family on 04/24/15     FL2 transmitted to all facilities within larger geographic area on       Patient informed that his/her managed care company has contracts with or will negotiate with certain facilities, including the following:        Yes   Patient/family informed of bed offers received.  Patient chooses bed at [facility]     Physician recommends and patient chooses bed at      Patient to be transferred to [facility] on 04/26/15.  Patient to be transferred to facility by [facility]     Patient family notified on 04/26/15 of transfer.  Name of family member notified:  [place] (wife)     PHYSICIAN       Additional Comment:     _______________________________________________ Berton Mount, [place] Weekend CSW [phone]

## 2015-04-26 NOTE — Progress Notes (Signed)
Subjective: Pt eating breakfast  No CP  Breathing ok  Hard of hearing   Objective: Filed Vitals:   04/25/15 2016 04/25/15 2030 04/26/15 0319 04/26/15 0400  BP: 107/53  107/54   Pulse: 84  66   Temp: 98.5 F (36.9 C)  97.7 F (36.5 C)   TempSrc: Oral  Oral   Resp: 30  26   Height:      Weight:    151 lb (68.493 kg)  SpO2: 96% 96% 100%    Weight change: -5 lb 11.2 oz (-2.586 kg)  Intake/Output Summary (Last 24 hours) at 04/26/15 0738 Last data filed at 04/26/15 3212  Gross per 24 hour  Intake     50 ml  Output   1725 ml  Net  -1675 ml    General: Alert, awake, oriented x3, in no acute distress Neck:  JVP is normal Heart: Regular rate and rhythm, without murmurs, rubs, gallops.  Lungs: Clear to auscultation.  No rales or wheezes. Exemities:  No edema.   Neuro: Grossly intact, nonfocal.  Tele:  SR   Lab Results: Results for orders placed or performed during the hospital encounter of 04/21/15 (from the past 24 hour(s))  CBC     Status: Abnormal   Collection Time: 04/26/15  5:50 AM  Result Value Ref Range   WBC 1.8 (L) 4.0 - 10.5 K/uL   RBC 3.21 (L) 4.22 - 5.81 MIL/uL   Hemoglobin 9.3 (L) 13.0 - 17.0 g/dL   HCT 29.8 (L) 39.0 - 52.0 %   MCV 92.8 78.0 - 100.0 fL   MCH 29.0 26.0 - 34.0 pg   MCHC 31.2 30.0 - 36.0 g/dL   RDW 17.2 (H) 11.5 - 15.5 %   Platelets 88 (L) 150 - 400 K/uL  Basic metabolic panel     Status: Abnormal   Collection Time: 04/26/15  5:50 AM  Result Value Ref Range   Sodium 129 (L) 135 - 145 mmol/L   Potassium 3.9 3.5 - 5.1 mmol/L   Chloride 99 (L) 101 - 111 mmol/L   CO2 24 22 - 32 mmol/L   Glucose, Bld 99 65 - 99 mg/dL   BUN 19 6 - 20 mg/dL   Creatinine, Ser 0.93 0.61 - 1.24 mg/dL   Calcium 7.4 (L) 8.9 - 10.3 mg/dL   GFR calc non Af Amer >60 >60 mL/min   GFR calc Af Amer >60 >60 mL/min   Anion gap 6 5 - 15  Magnesium     Status: None   Collection Time: 04/26/15  5:50 AM  Result Value Ref Range   Magnesium 1.8 1.7 - 2.4 mg/dL     Studies/Results: Ct Chest Wo Contrast  04/25/2015   CLINICAL DATA:  Severe cough.  History of Felty's syndrome.  EXAM: CT CHEST WITHOUT CONTRAST  TECHNIQUE: Multidetector CT imaging of the chest was performed following the standard protocol without IV contrast.  COMPARISON:  Portable chest dated 04/22/2015  FINDINGS: Moderate-sized bilateral pleural effusions with adjacent bilateral lower lobe atelectasis. Minimal dependent atelectasis in both upper lobes. No lung nodules or enlarged lymph nodes. The lungs are mildly hyperexpanded with minimal diffuse bullous changes. Atheromatous coronary artery calcifications. Thoracic spine degenerative changes, including changes of DISH. Cholecystectomy clips. Enlarged spleen.  IMPRESSION: 1. Moderate-sized bilateral pleural effusions and associated bilateral lower lobe atelectasis. 2. Minimal bilateral upper lobe dependent atelectasis. 3. Mild changes of COPD. 4. Atheromatous coronary artery calcifications. 5. 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  Electronically Signed   By: Claudie Revering M.D.   On: 04/25/2015 20:34    Medications: Reviewed  @PROBHOSP @    1  Atrial fib  Remains in SR  Keep on amiodarone 400 bid  Switch to 1x per day at d/c  Keep on metoprolol  WIll make sure he has a f/u appt as outpt  2.  CAD  No angina  Normal LVEF   Got lasix yestreday  WOuld not dose again  3.  HTN  BP controlled  Watch for dizziness    OK to d/c from cardiac standpoint  WIll be available as needed  Please call with question  Will arrange for f/u appt    Dorris Carnes 04/26/2015, 7:38 AM

## 2015-04-28 ENCOUNTER — Other Ambulatory Visit: Payer: Self-pay | Admitting: *Deleted

## 2015-04-28 ENCOUNTER — Telehealth: Payer: Self-pay | Admitting: Internal Medicine

## 2015-04-28 DIAGNOSIS — D696 Thrombocytopenia, unspecified: Secondary | ICD-10-CM | POA: Diagnosis not present

## 2015-04-28 DIAGNOSIS — E46 Unspecified protein-calorie malnutrition: Secondary | ICD-10-CM | POA: Diagnosis not present

## 2015-04-28 DIAGNOSIS — I10 Essential (primary) hypertension: Secondary | ICD-10-CM | POA: Diagnosis not present

## 2015-04-28 DIAGNOSIS — D61818 Other pancytopenia: Secondary | ICD-10-CM | POA: Diagnosis not present

## 2015-04-28 DIAGNOSIS — I482 Chronic atrial fibrillation: Secondary | ICD-10-CM | POA: Diagnosis not present

## 2015-04-28 MED ORDER — LEVOTHYROXINE SODIUM 50 MCG PO TABS: 50.0000 ug | ORAL_TABLET | Freq: Every day | ORAL | Status: AC

## 2015-04-28 NOTE — Progress Notes (Signed)
Late Entry Addendum for Initial Evaluation    04/23/15 1643  PT Time Calculation  PT Start Time (ACUTE ONLY) 1424  PT Stop Time (ACUTE ONLY) 1448  PT Time Calculation (min) (ACUTE ONLY) 24 min  PT G-Codes **NOT FOR INPATIENT CLASS**  Functional Assessment Tool Used clinical judgement  Functional Limitation Mobility: Walking and moving around  Mobility: Walking and Moving Around Current Status (A1937) CI  Mobility: Walking and Moving Around Goal Status (T0240) CI  PT General Charges  $$ ACUTE PT VISIT 1 Procedure  PT Evaluation  $Initial PT Evaluation Tier I 1 Procedure    04/28/2015  Donnella Sham, PT [phone]  (pager)

## 2015-04-28 NOTE — Telephone Encounter (Signed)
Pt's wife requesting call back from [place].  She states Butch Penny has been trying to reach her but she has been missing her phone call.  Pt is has been discharged from the hospital and is now at [facility].

## 2015-04-28 NOTE — Telephone Encounter (Signed)
Spoke to [place] pt's wife see result notes.

## 2015-04-29 ENCOUNTER — Ambulatory Visit: Payer: Medicare Other | Admitting: Internal Medicine

## 2015-05-07 ENCOUNTER — Ambulatory Visit: Payer: Medicare Other | Admitting: *Deleted

## 2015-05-13 ENCOUNTER — Ambulatory Visit: Payer: Medicare Other | Admitting: Internal Medicine

## 2015-05-19 DIAGNOSIS — Z7901 Long term (current) use of anticoagulants: Secondary | ICD-10-CM | POA: Diagnosis not present

## 2015-05-19 DIAGNOSIS — E785 Hyperlipidemia, unspecified: Secondary | ICD-10-CM | POA: Diagnosis not present

## 2015-05-19 DIAGNOSIS — I480 Paroxysmal atrial fibrillation: Secondary | ICD-10-CM | POA: Diagnosis not present

## 2015-05-19 DIAGNOSIS — I2510 Atherosclerotic heart disease of native coronary artery without angina pectoris: Secondary | ICD-10-CM | POA: Diagnosis not present

## 2015-05-20 DIAGNOSIS — M79606 Pain in leg, unspecified: Secondary | ICD-10-CM | POA: Diagnosis not present

## 2015-05-26 ENCOUNTER — Encounter: Payer: Medicare Other | Admitting: Physician Assistant

## 2015-05-27 DIAGNOSIS — M25552 Pain in left hip: Secondary | ICD-10-CM | POA: Diagnosis not present

## 2015-05-27 DIAGNOSIS — I5030 Unspecified diastolic (congestive) heart failure: Secondary | ICD-10-CM | POA: Diagnosis not present

## 2015-05-27 DIAGNOSIS — I10 Essential (primary) hypertension: Secondary | ICD-10-CM | POA: Diagnosis not present

## 2015-05-27 DIAGNOSIS — E039 Hypothyroidism, unspecified: Secondary | ICD-10-CM | POA: Diagnosis not present

## 2015-06-16 ENCOUNTER — Telehealth: Payer: Self-pay | Admitting: *Deleted

## 2015-06-16 NOTE — Telephone Encounter (Signed)
FYI Patient's wife called reporting "cancel tomorrow's appointments for Kevin Rubio.  He is a resident in a nursing home off [address] in [place]."  Asked who [place] needs to contact for transportation to any future appointments.  "I don't know if there will be any future appointments but we will call to schedule if needed."

## 2015-06-17 ENCOUNTER — Ambulatory Visit: Payer: Self-pay | Admitting: Oncology

## 2015-06-17 ENCOUNTER — Other Ambulatory Visit: Payer: Self-pay

## 2015-06-19 ENCOUNTER — Telehealth: Payer: Self-pay | Admitting: Internal Medicine

## 2015-06-19 NOTE — Telephone Encounter (Signed)
FYI per pt wife her husband passed away last night

## 2015-06-23 DEATH — deceased

## 2015-07-01 ENCOUNTER — Ambulatory Visit: Payer: Self-pay | Admitting: Internal Medicine

## 2016-07-20 IMAGING — CR DG CHEST 1V PORT
1 series · 1 of 1 positions shown · non-contrast
Comparison: 01/25/2011

CLINICAL DATA: Chest pain, shortness of breath

EXAM:
PORTABLE CHEST - 1 VIEW

[AP]
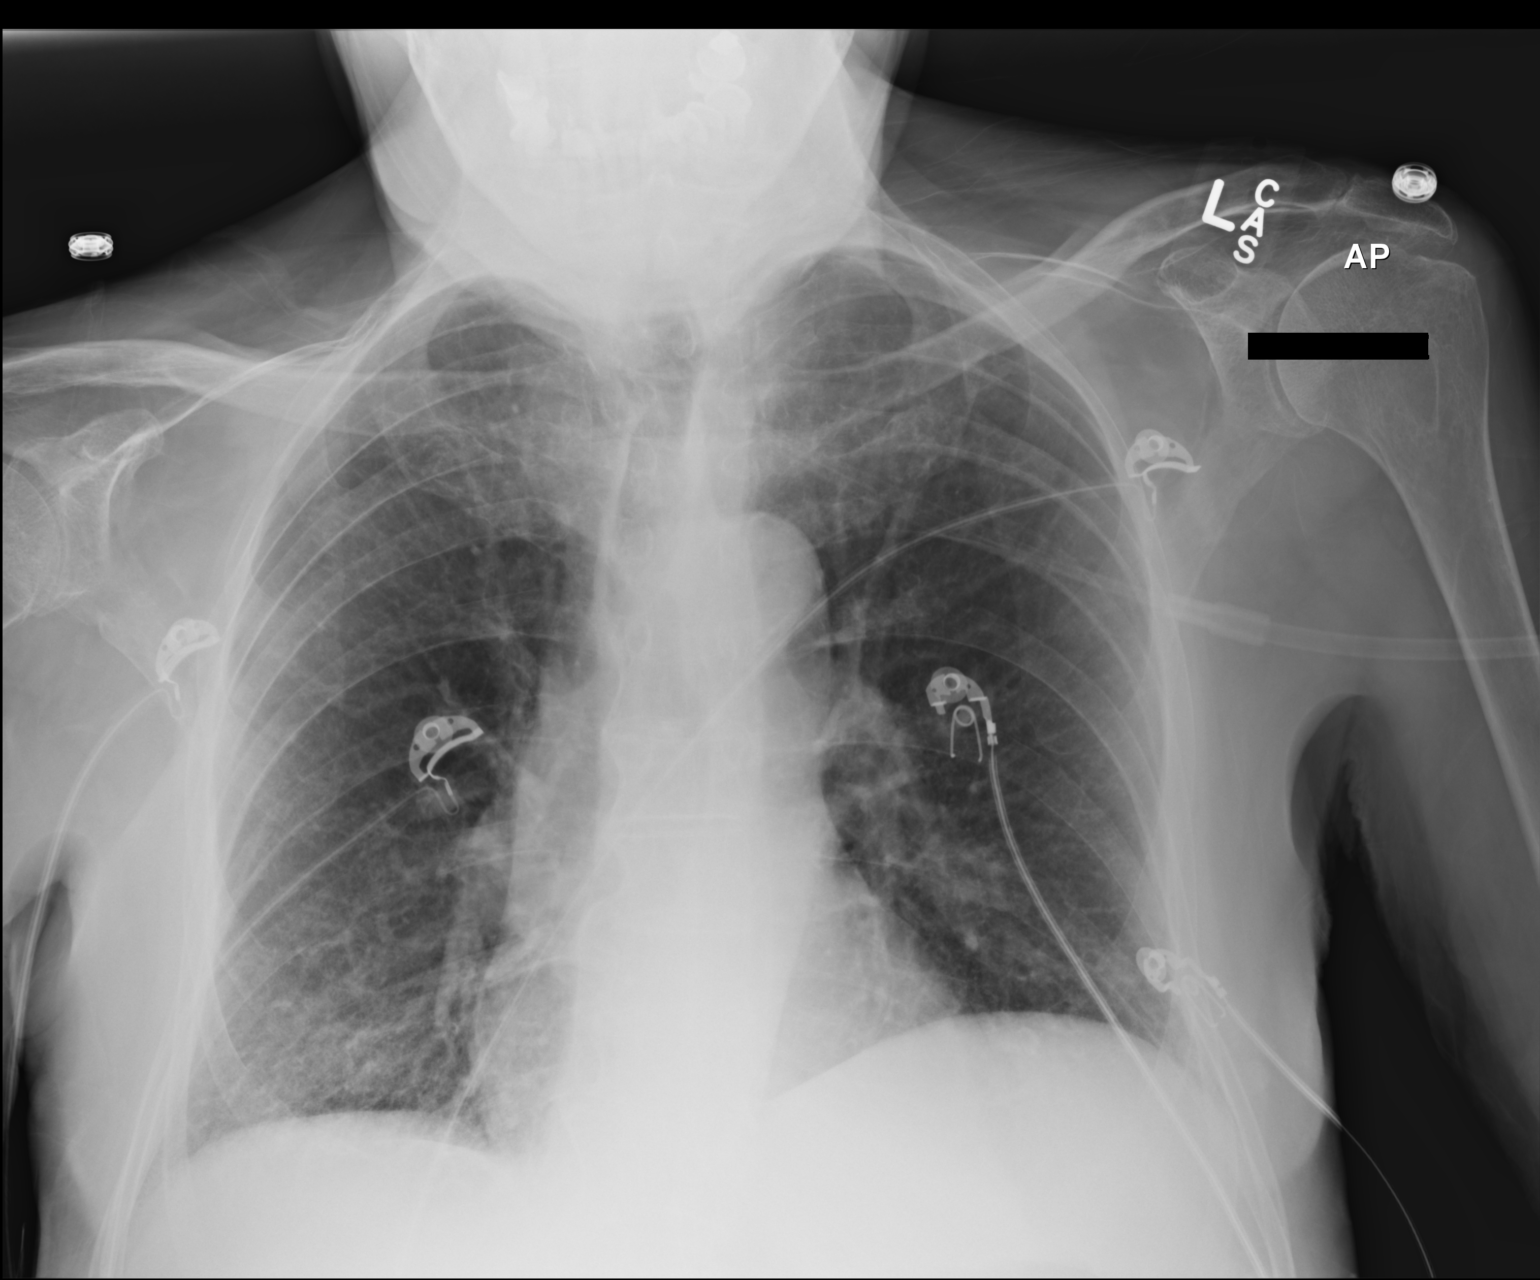

[1 of 1 positions shown; findings below may reference images not displayed]

FINDINGS: Normal heart size. No pleural effusion identified. Lung volumes are
low and there are diffuse coarsened interstitial markings noted
bilaterally. Airspace opacity within the right lung base is
identified. Left lung appears clear.
IMPRESSION: 1. Right base opacity, suspicious for pneumonia.

## 2016-11-16 IMAGING — US US SCROTUM
1 series · 14 of 25 positions shown · non-contrast
Comparison: CT 03/21/2015

CLINICAL DATA: Scrotal swelling and redness. Previous hernia
repair.

EXAM:
ULTRASOUND OF SCROTUM
TECHNIQUE: Complete ultrasound examination of the testicles, epididymis, and
other scrotal structures was performed.

[Series 1: us scrotum · 0.07mm/px · 14 of 74 slices shown]
[im 1/74]
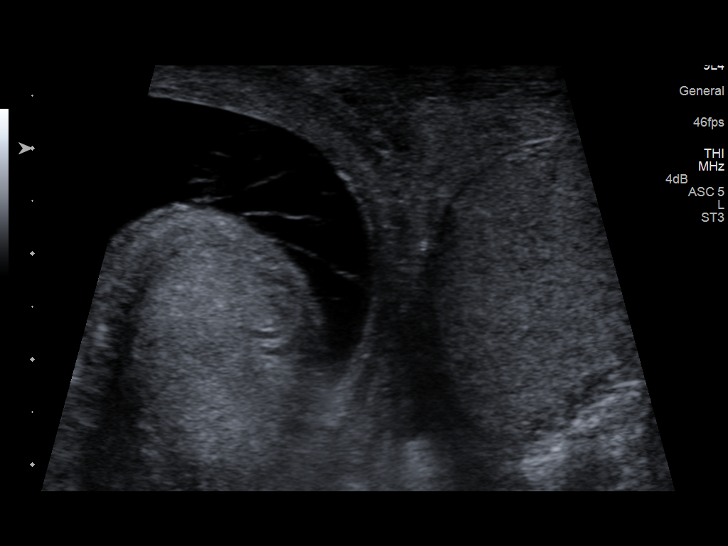
[im 7/74]
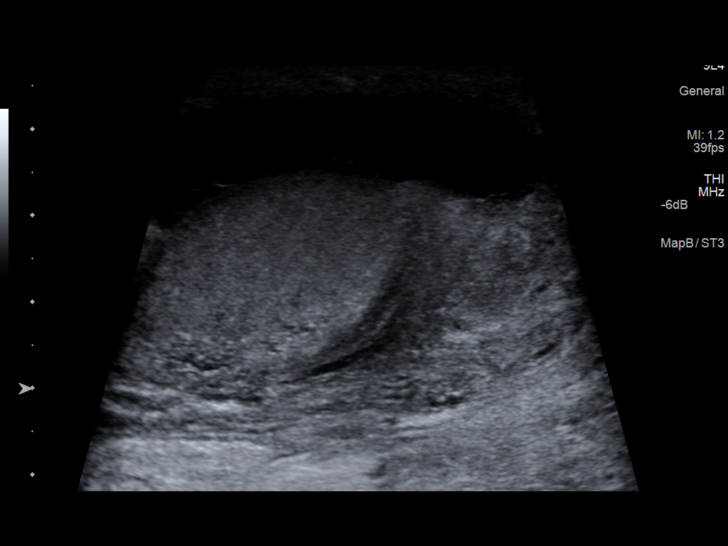
[im 13/74]
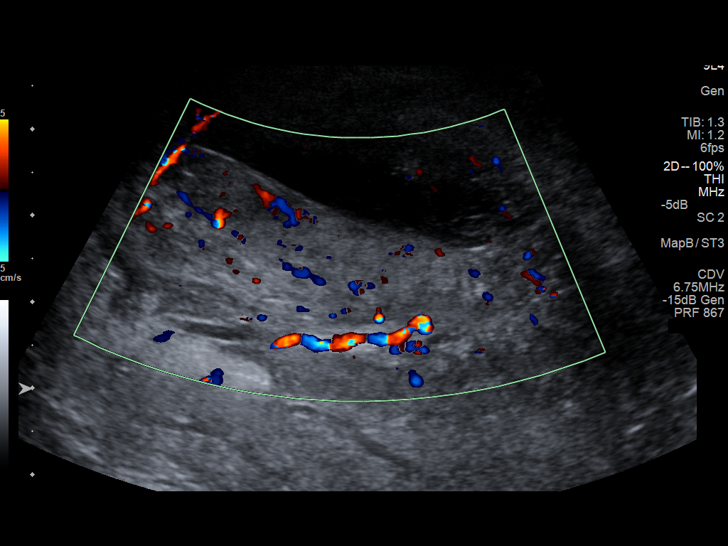
[im 19/74]
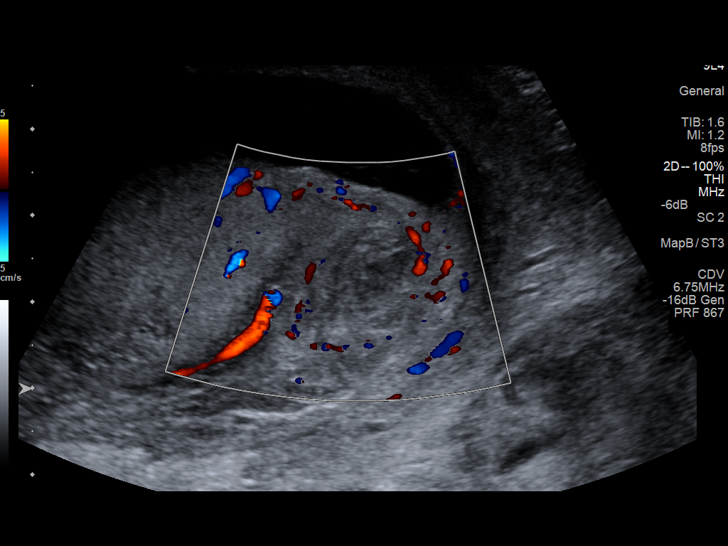
[im 25/74]
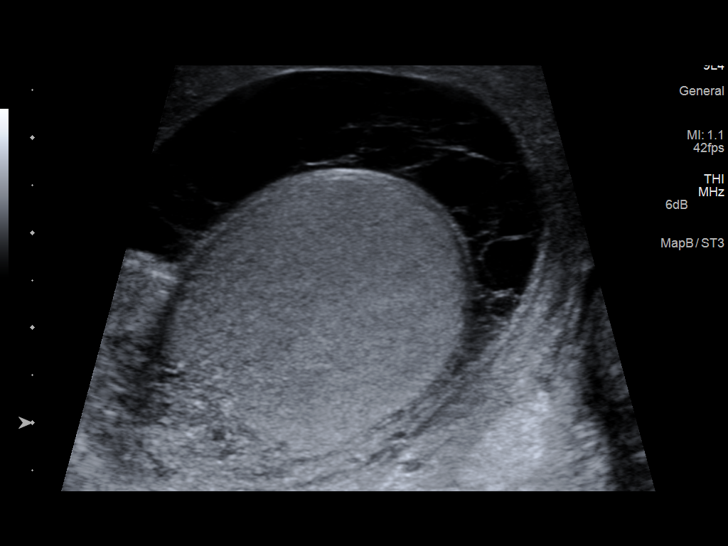
[im 28/74]
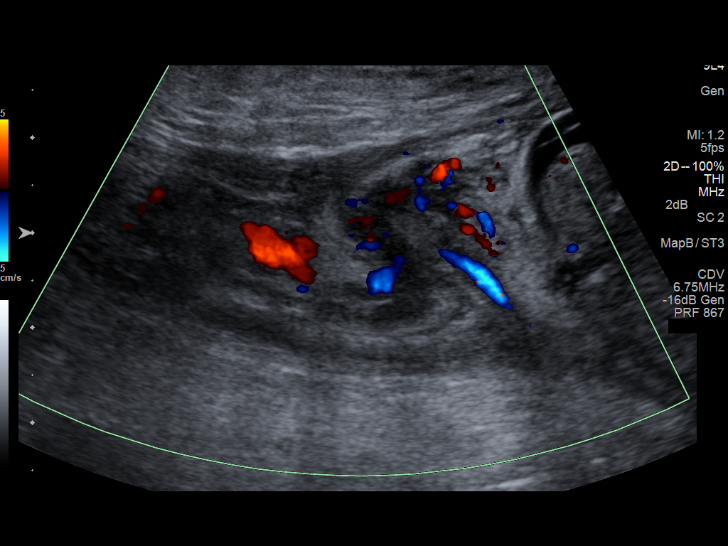
[im 34/74]
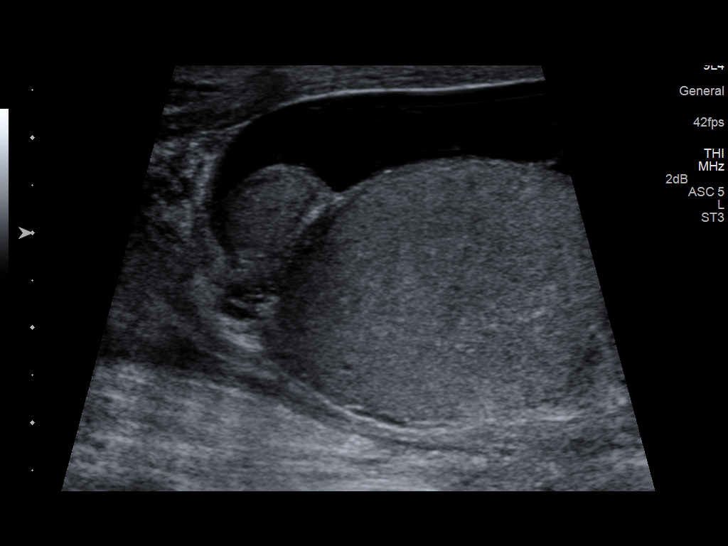
[im 40/74]
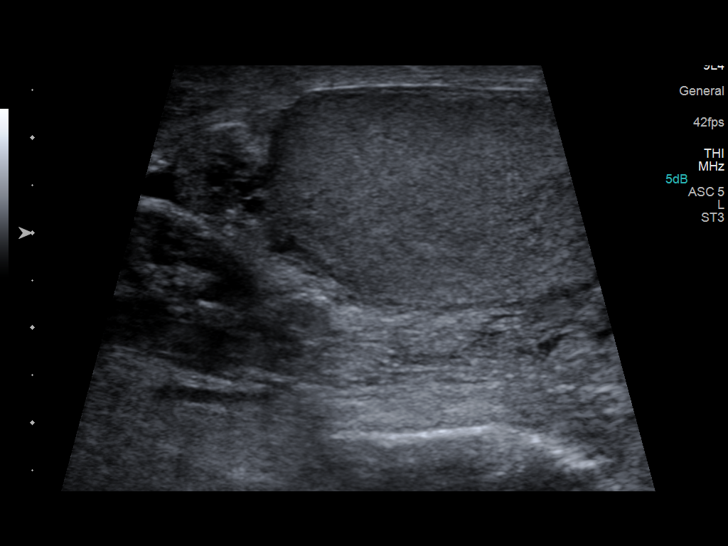
[im 46/74]
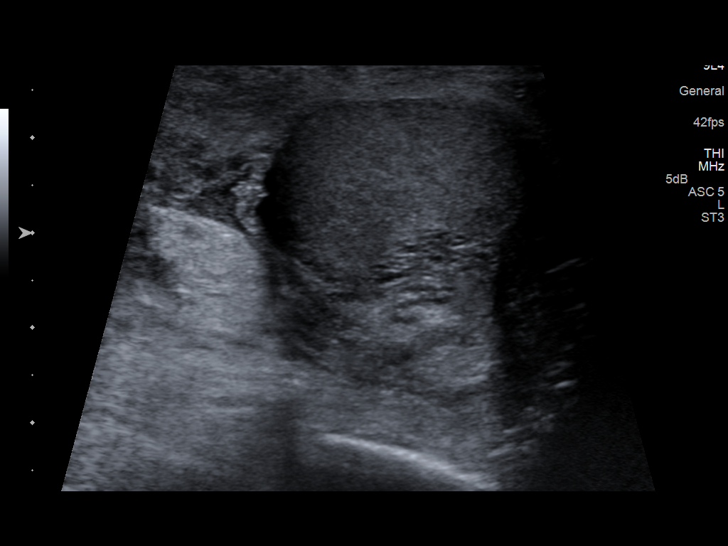
[im 49/74]
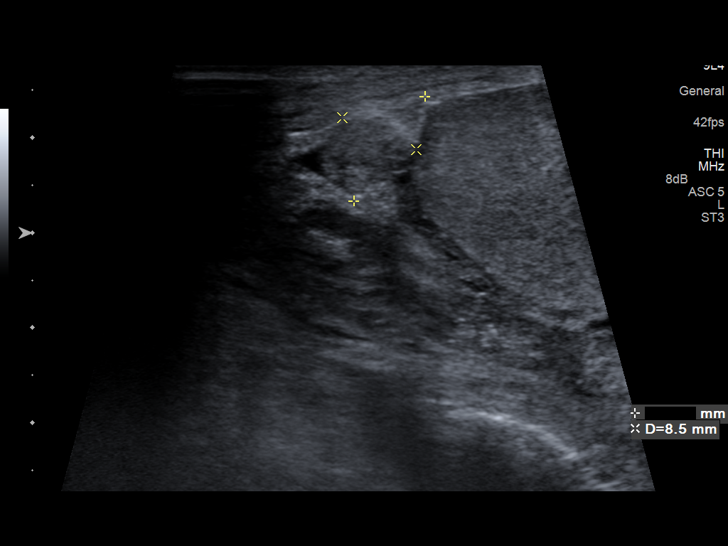
[im 55/74]
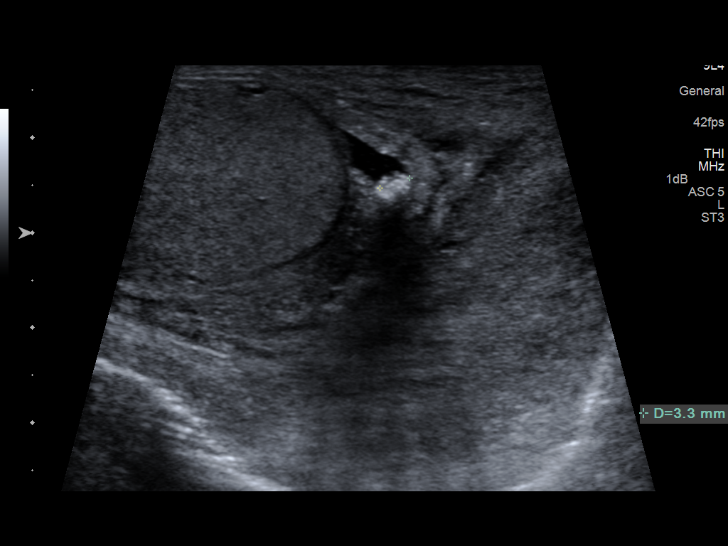
[im 61/74]
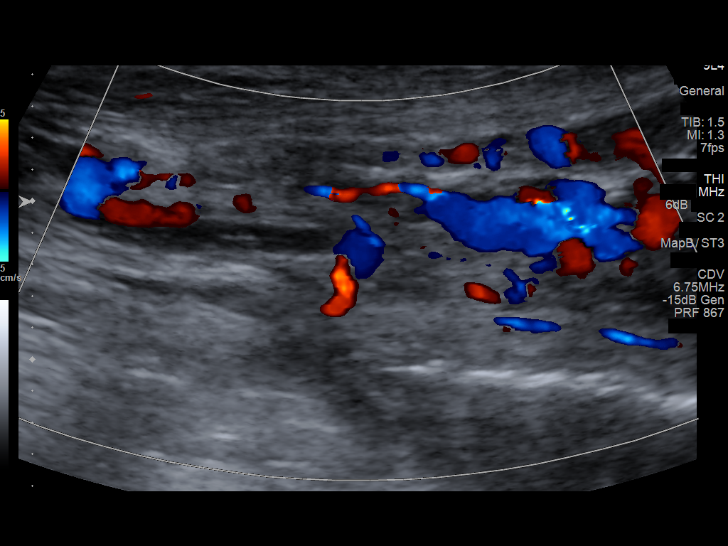
[im 67/74]
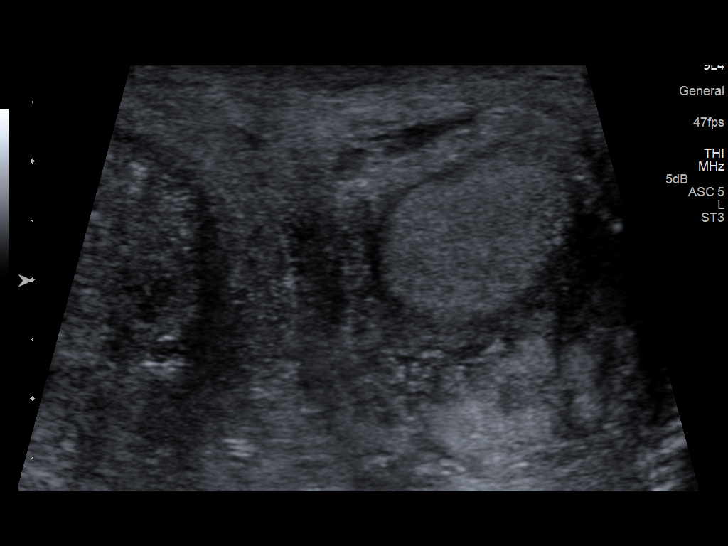
[im 74/74]
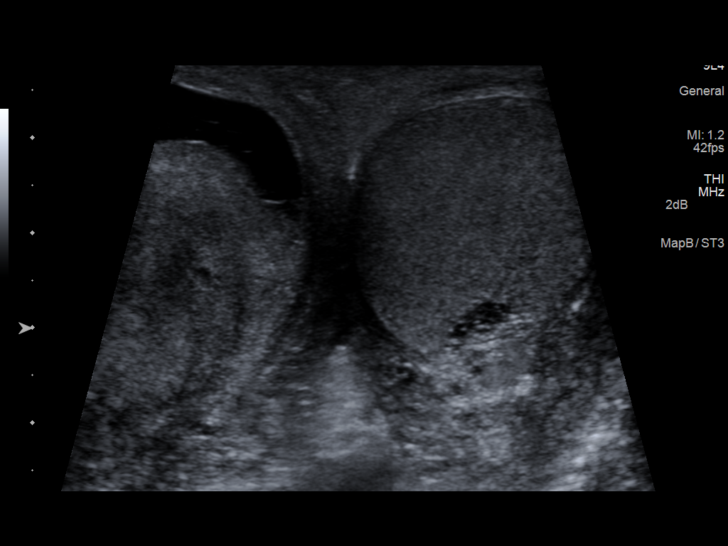

[14 of 25 positions shown; findings below may reference images not displayed]

FINDINGS: Right testicle

Measurements: 2.8 x 3.1 x 3.6 cm. No mass or microlithiasis
visualized. Increased color Doppler flow.

Left testicle

Measurements: 2.5 x 2.8 x 4.5 cm. No mass or microlithiasis
visualized. Mild ectasia of the rete testis. Normal color Doppler
flow.

Right epididymis: Diffuse increased vascularity with mild
heterogeneous enlargement.

Left epididymis:  Normal in size and appearance.

Hydrocele: Moderate size somewhat complex right hydrocele multiple
septations. Small left hydrocele.

Varicocele:  Small bilateral varicoceles right worse than left.

Few nonspecific left scrotal pearls.
IMPRESSION: Enlarged heterogeneous right epididymis with increased vascularity
to the right epididymis and testicle. Moderate size mildly complex
right hydrocele versus pyocele. Findings most typical of
infection/epididymal orchitis.

Small left hydrocele.

Small bilateral varicoceles.

## 2017-02-25 IMAGING — CT CT CHEST W/O CM
2 of 3 series · 15 of 36 positions shown, 18 images · non-contrast
Comparison: Portable chest dated 04/22/2015

CLINICAL DATA: Severe cough.  History of Felty's syndrome.

EXAM:
CT CHEST WITHOUT CONTRAST
TECHNIQUE: Multidetector CT imaging of the chest was performed following the
standard protocol without IV contrast.

[Series 5: thorax 5.0 i31f 1 · axial · 0.78mm/px · z∈[+1132,+1402]mm · 12 of 64 slices shown, 15 images]
[im 5/64  mediastinal]
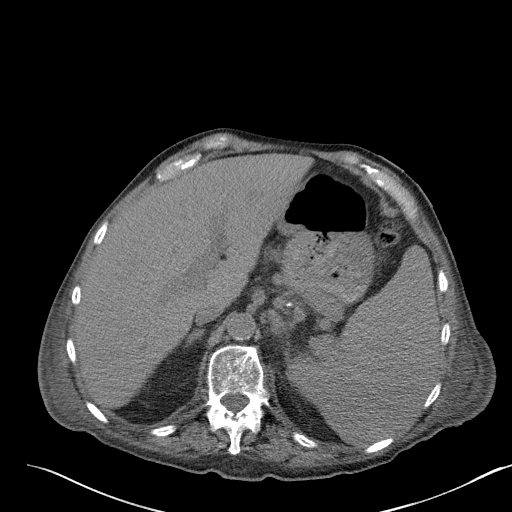
[im 5/64  lung]
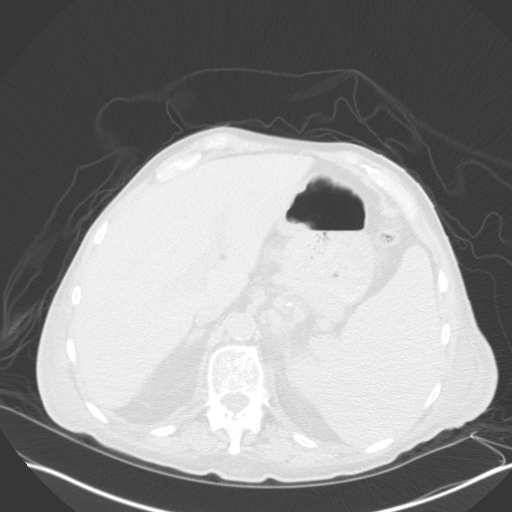
[im 10/64  lung]
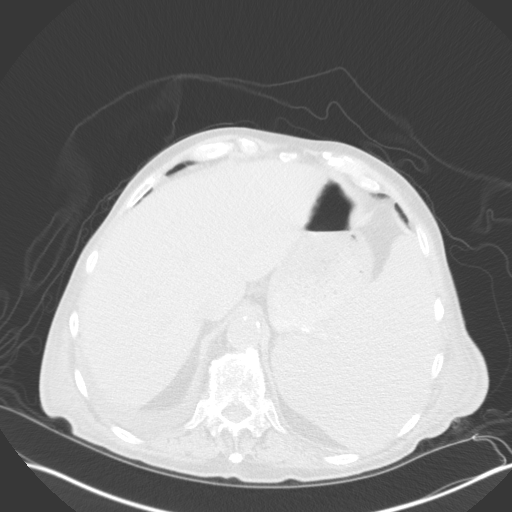
[im 15/64  lung]
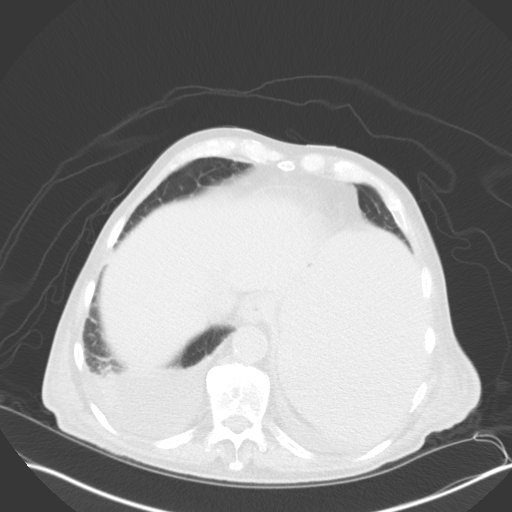
[im 19/64  lung]
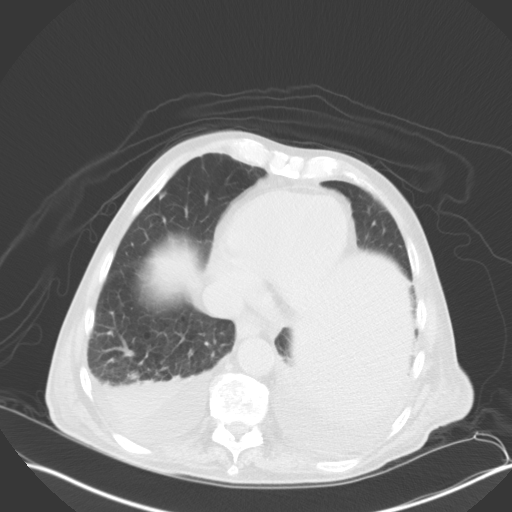
[im 24/64  mediastinal]
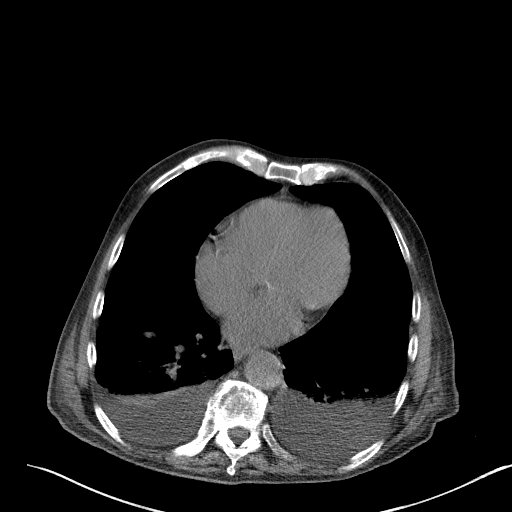
[im 24/64  lung]
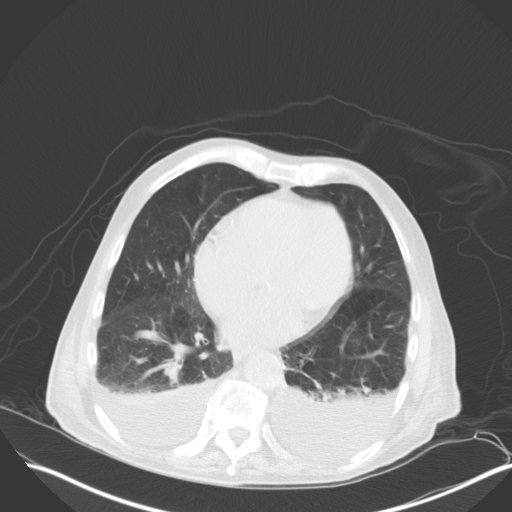
[im 29/64  lung]
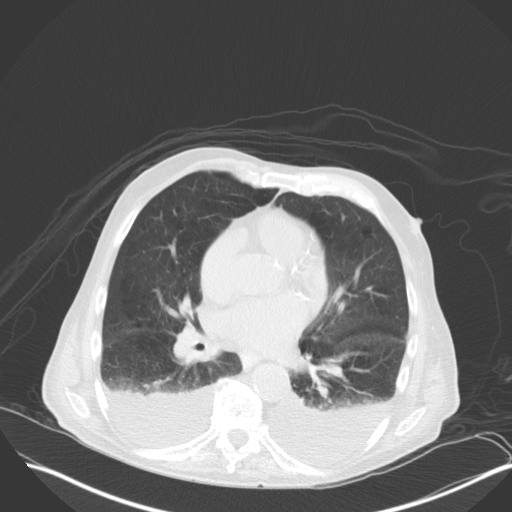
[im 36/64  lung]
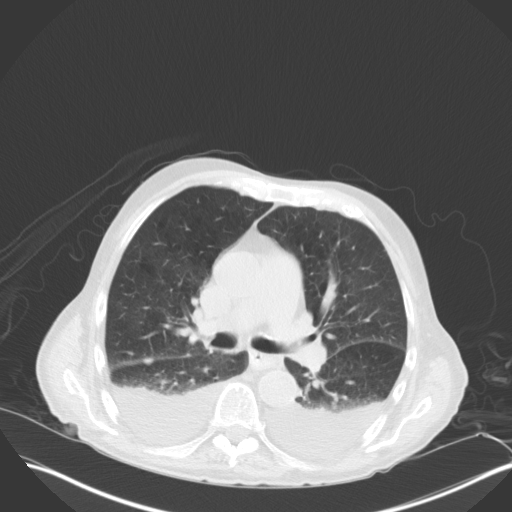
[im 40/64  lung]
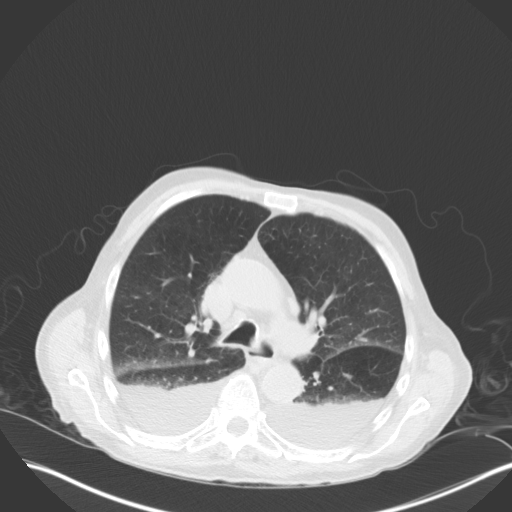
[im 45/64  mediastinal]
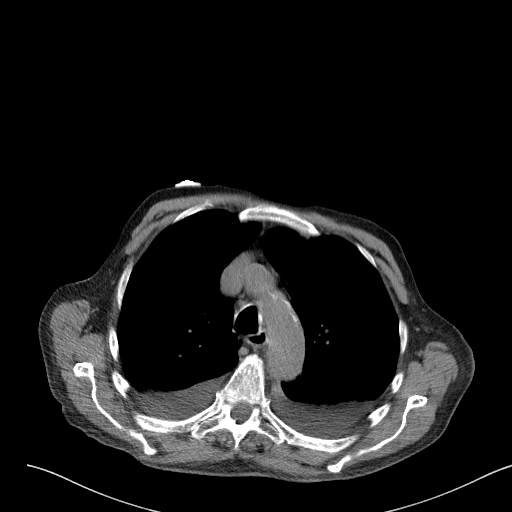
[im 45/64  lung]
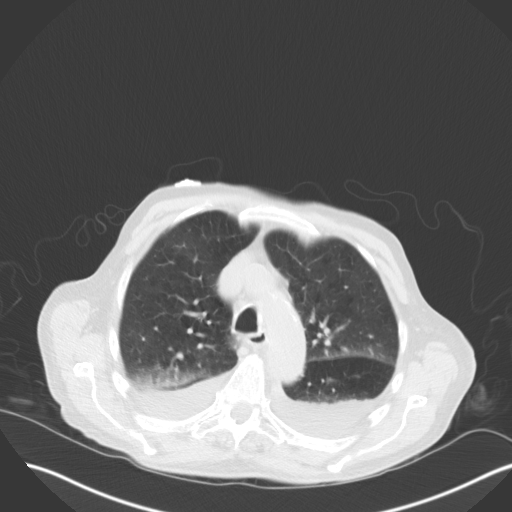
[im 50/64  lung]
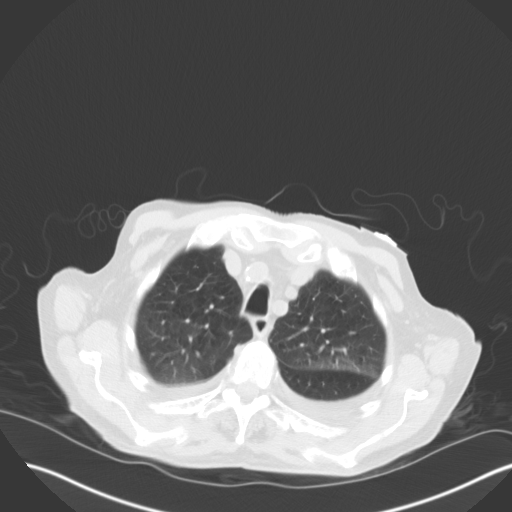
[im 54/64  lung]
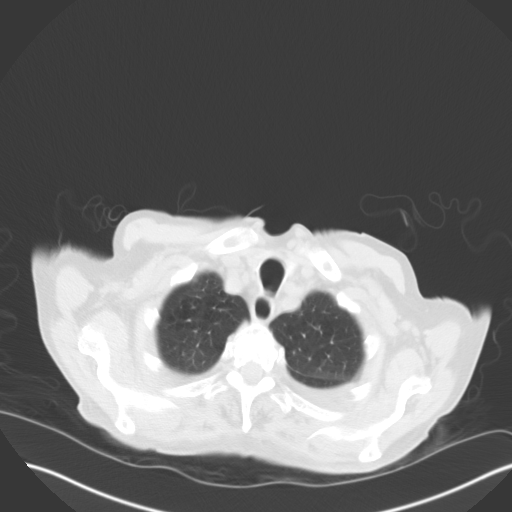
[im 59/64  lung]
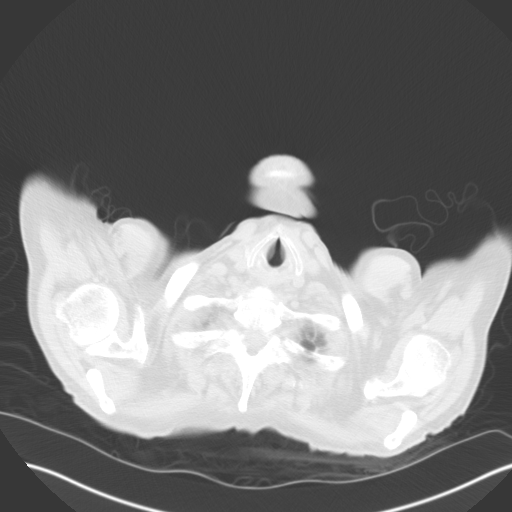

[Series 6: coronal · coronal · 0.62mm/px · 3 of 86 slices shown]
[im 18/86  lung]
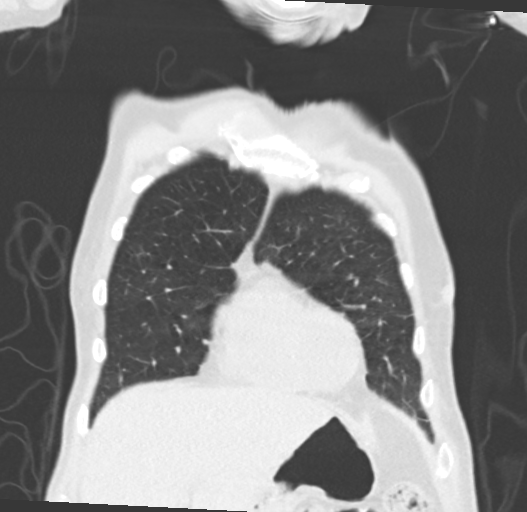
[im 35/86  lung]
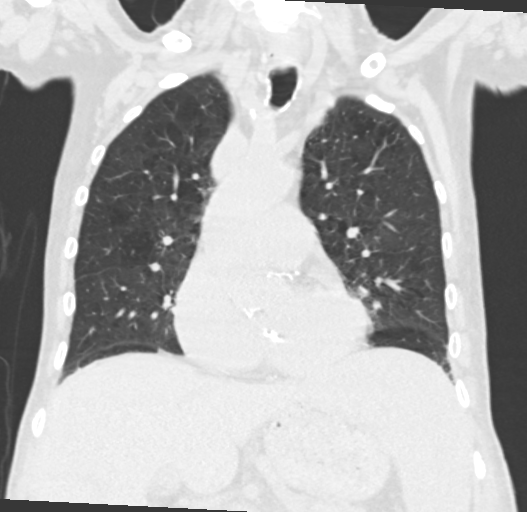
[im 52/86  lung]
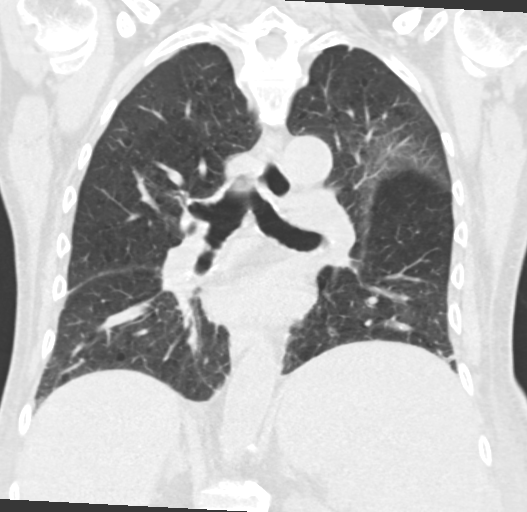

[15 of 36 positions shown; findings below may reference images not displayed]

FINDINGS: Moderate-sized bilateral pleural effusions with adjacent bilateral
lower lobe atelectasis. Minimal dependent atelectasis in both upper
lobes. No lung nodules or enlarged lymph nodes. The lungs are mildly
hyperexpanded with minimal diffuse bullous changes. Atheromatous
coronary artery calcifications. Thoracic spine degenerative changes,
including changes of DISH. Cholecystectomy clips. Enlarged spleen.
IMPRESSION: 1. Moderate-sized bilateral pleural effusions and associated
bilateral lower lobe atelectasis.
2. Minimal bilateral upper lobe dependent atelectasis.
3. Mild changes of COPD.
4. Atheromatous coronary artery calcifications.
5. Splenomegaly compatible with the history of Felty's syndrome.
